# Patient Record
Sex: Female | Born: 1960 | Race: White | Hispanic: No | Smoking: Former smoker
Health system: Southern US, Community
[De-identification: ages and names within clinical notes are randomized; demographics above are authoritative.]

## PROBLEM LIST (undated history)

## (undated) DIAGNOSIS — B009 Herpesviral infection, unspecified: Secondary | ICD-10-CM

## (undated) DIAGNOSIS — F32A Depression, unspecified: Secondary | ICD-10-CM

## (undated) DIAGNOSIS — K449 Diaphragmatic hernia without obstruction or gangrene: Secondary | ICD-10-CM

## (undated) DIAGNOSIS — R1310 Dysphagia, unspecified: Secondary | ICD-10-CM

## (undated) DIAGNOSIS — I10 Essential (primary) hypertension: Secondary | ICD-10-CM

## (undated) DIAGNOSIS — J45909 Unspecified asthma, uncomplicated: Secondary | ICD-10-CM

## (undated) DIAGNOSIS — F419 Anxiety disorder, unspecified: Secondary | ICD-10-CM

## (undated) DIAGNOSIS — F329 Major depressive disorder, single episode, unspecified: Secondary | ICD-10-CM

## (undated) HISTORY — PX: TUBAL LIGATION: SHX77

## (undated) HISTORY — PX: BREAST BIOPSY: SHX20

## (undated) HISTORY — DX: Depression, unspecified: F32.A

## (undated) HISTORY — DX: Anxiety disorder, unspecified: F41.9

## (undated) HISTORY — DX: Diaphragmatic hernia without obstruction or gangrene: K44.9

## (undated) HISTORY — PX: CHOLECYSTECTOMY: SHX55

## (undated) HISTORY — DX: Unspecified asthma, uncomplicated: J45.909

## (undated) HISTORY — DX: Dysphagia, unspecified: R13.10

## (undated) HISTORY — PX: BREAST LUMPECTOMY: SHX2

## (undated) HISTORY — DX: Herpesviral infection, unspecified: B00.9

## (undated) HISTORY — PX: COLONOSCOPY: SHX174

---
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
## 1998-01-19 ENCOUNTER — Other Ambulatory Visit: Admission: RE | Admit: 1998-01-19 | Discharge: 1998-01-19 | Payer: Self-pay | Admitting: Obstetrics and Gynecology

## 1999-01-31 ENCOUNTER — Other Ambulatory Visit: Admission: RE | Admit: 1999-01-31 | Discharge: 1999-01-31 | Payer: Self-pay | Admitting: Obstetrics and Gynecology

## 2000-02-06 ENCOUNTER — Other Ambulatory Visit: Admission: RE | Admit: 2000-02-06 | Discharge: 2000-02-06 | Payer: Self-pay | Admitting: Obstetrics & Gynecology

## 2000-02-06 ENCOUNTER — Other Ambulatory Visit: Admission: RE | Admit: 2000-02-06 | Discharge: 2000-02-06 | Payer: Self-pay | Admitting: Obstetrics and Gynecology

## 2001-02-26 ENCOUNTER — Other Ambulatory Visit: Admission: RE | Admit: 2001-02-26 | Discharge: 2001-02-26 | Payer: Self-pay | Admitting: Obstetrics and Gynecology

## 2002-03-26 ENCOUNTER — Other Ambulatory Visit: Admission: RE | Admit: 2002-03-26 | Discharge: 2002-03-26 | Payer: Self-pay | Admitting: Obstetrics and Gynecology

## 2003-05-27 ENCOUNTER — Other Ambulatory Visit: Admission: RE | Admit: 2003-05-27 | Discharge: 2003-05-27 | Payer: Self-pay | Admitting: Obstetrics and Gynecology

## 2004-06-01 ENCOUNTER — Other Ambulatory Visit: Admission: RE | Admit: 2004-06-01 | Discharge: 2004-06-01 | Payer: Self-pay | Admitting: Obstetrics and Gynecology

## 2004-12-01 ENCOUNTER — Observation Stay (HOSPITAL_COMMUNITY): Admission: RE | Admit: 2004-12-01 | Discharge: 2004-12-02 | Payer: Self-pay | Admitting: Obstetrics and Gynecology

## 2005-06-08 ENCOUNTER — Other Ambulatory Visit: Admission: RE | Admit: 2005-06-08 | Discharge: 2005-06-08 | Payer: Self-pay | Admitting: Obstetrics and Gynecology

## 2005-06-21 ENCOUNTER — Ambulatory Visit (HOSPITAL_COMMUNITY): Admission: RE | Admit: 2005-06-21 | Discharge: 2005-06-21 | Payer: Self-pay | Admitting: Obstetrics and Gynecology

## 2006-06-06 ENCOUNTER — Emergency Department (HOSPITAL_COMMUNITY): Admission: EM | Admit: 2006-06-06 | Discharge: 2006-06-06 | Payer: Self-pay | Admitting: Emergency Medicine

## 2011-05-04 ENCOUNTER — Other Ambulatory Visit: Payer: Self-pay | Admitting: Obstetrics and Gynecology

## 2012-06-11 ENCOUNTER — Other Ambulatory Visit: Payer: Self-pay | Admitting: Obstetrics and Gynecology

## 2012-06-16 ENCOUNTER — Other Ambulatory Visit: Payer: Self-pay | Admitting: Obstetrics and Gynecology

## 2012-06-16 DIAGNOSIS — R928 Other abnormal and inconclusive findings on diagnostic imaging of breast: Secondary | ICD-10-CM

## 2012-06-26 ENCOUNTER — Ambulatory Visit
Admission: RE | Admit: 2012-06-26 | Discharge: 2012-06-26 | Disposition: A | Discharge status: Home / Self Care | Payer: BC Managed Care – PPO | Source: Ambulatory Visit | Attending: Obstetrics and Gynecology | Admitting: Obstetrics and Gynecology

## 2012-06-26 DIAGNOSIS — R928 Other abnormal and inconclusive findings on diagnostic imaging of breast: Secondary | ICD-10-CM

## 2013-06-26 ENCOUNTER — Other Ambulatory Visit: Payer: Self-pay | Admitting: Obstetrics and Gynecology

## 2013-08-18 ENCOUNTER — Ambulatory Visit: Payer: Self-pay

## 2013-08-25 ENCOUNTER — Ambulatory Visit: Payer: Self-pay

## 2014-07-01 ENCOUNTER — Other Ambulatory Visit: Payer: Self-pay | Admitting: Obstetrics and Gynecology

## 2014-07-02 ENCOUNTER — Other Ambulatory Visit: Payer: Self-pay | Admitting: Obstetrics and Gynecology

## 2014-07-02 DIAGNOSIS — R928 Other abnormal and inconclusive findings on diagnostic imaging of breast: Secondary | ICD-10-CM

## 2014-07-05 LAB — CYTOLOGY - PAP

## 2014-07-12 ENCOUNTER — Ambulatory Visit
Admission: RE | Admit: 2014-07-12 | Discharge: 2014-07-12 | Disposition: A | Discharge status: Home / Self Care | Payer: BLUE CROSS/BLUE SHIELD | Source: Ambulatory Visit | Attending: Obstetrics and Gynecology | Admitting: Obstetrics and Gynecology

## 2014-07-12 ENCOUNTER — Other Ambulatory Visit: Payer: Self-pay | Admitting: Obstetrics and Gynecology

## 2014-07-12 DIAGNOSIS — R928 Other abnormal and inconclusive findings on diagnostic imaging of breast: Secondary | ICD-10-CM

## 2014-07-15 ENCOUNTER — Other Ambulatory Visit: Payer: Self-pay | Admitting: Obstetrics and Gynecology

## 2014-07-15 DIAGNOSIS — R928 Other abnormal and inconclusive findings on diagnostic imaging of breast: Secondary | ICD-10-CM

## 2014-07-19 ENCOUNTER — Inpatient Hospital Stay: Admission: RE | Admit: 2014-07-19 | Payer: BLUE CROSS/BLUE SHIELD | Source: Ambulatory Visit

## 2014-07-26 ENCOUNTER — Ambulatory Visit
Admission: RE | Admit: 2014-07-26 | Discharge: 2014-07-26 | Disposition: A | Discharge status: Home / Self Care | Payer: BLUE CROSS/BLUE SHIELD | Source: Ambulatory Visit | Attending: Obstetrics and Gynecology | Admitting: Obstetrics and Gynecology

## 2014-07-26 ENCOUNTER — Other Ambulatory Visit: Payer: BLUE CROSS/BLUE SHIELD

## 2014-07-26 ENCOUNTER — Other Ambulatory Visit: Payer: Self-pay | Admitting: Obstetrics and Gynecology

## 2014-07-26 DIAGNOSIS — R928 Other abnormal and inconclusive findings on diagnostic imaging of breast: Secondary | ICD-10-CM

## 2014-07-26 DIAGNOSIS — N6002 Solitary cyst of left breast: Secondary | ICD-10-CM

## 2015-10-19 ENCOUNTER — Other Ambulatory Visit (HOSPITAL_BASED_OUTPATIENT_CLINIC_OR_DEPARTMENT_OTHER): Payer: Self-pay | Admitting: Podiatry

## 2015-10-19 DIAGNOSIS — M79671 Pain in right foot: Secondary | ICD-10-CM

## 2015-10-29 ENCOUNTER — Encounter (HOSPITAL_BASED_OUTPATIENT_CLINIC_OR_DEPARTMENT_OTHER): Payer: Self-pay

## 2015-10-29 ENCOUNTER — Ambulatory Visit (HOSPITAL_BASED_OUTPATIENT_CLINIC_OR_DEPARTMENT_OTHER): Payer: 59

## 2016-01-06 ENCOUNTER — Other Ambulatory Visit: Payer: Self-pay | Admitting: Obstetrics and Gynecology

## 2016-01-06 ENCOUNTER — Other Ambulatory Visit: Payer: Self-pay | Admitting: Family Medicine

## 2016-01-06 DIAGNOSIS — Z1231 Encounter for screening mammogram for malignant neoplasm of breast: Secondary | ICD-10-CM

## 2016-02-14 ENCOUNTER — Ambulatory Visit
Admission: RE | Admit: 2016-02-14 | Discharge: 2016-02-14 | Disposition: A | Discharge status: Home / Self Care | Payer: 59 | Source: Ambulatory Visit | Attending: Obstetrics and Gynecology | Admitting: Obstetrics and Gynecology

## 2016-02-14 DIAGNOSIS — Z1231 Encounter for screening mammogram for malignant neoplasm of breast: Secondary | ICD-10-CM

## 2016-04-11 DIAGNOSIS — Z79899 Other long term (current) drug therapy: Secondary | ICD-10-CM | POA: Diagnosis not present

## 2016-04-11 DIAGNOSIS — Z1389 Encounter for screening for other disorder: Secondary | ICD-10-CM | POA: Diagnosis not present

## 2016-04-19 DIAGNOSIS — G4700 Insomnia, unspecified: Secondary | ICD-10-CM | POA: Diagnosis not present

## 2016-05-09 DIAGNOSIS — Z79899 Other long term (current) drug therapy: Secondary | ICD-10-CM | POA: Diagnosis not present

## 2016-06-19 DIAGNOSIS — Z79899 Other long term (current) drug therapy: Secondary | ICD-10-CM | POA: Diagnosis not present

## 2016-12-21 DIAGNOSIS — M549 Dorsalgia, unspecified: Secondary | ICD-10-CM | POA: Diagnosis not present

## 2016-12-21 DIAGNOSIS — M62830 Muscle spasm of back: Secondary | ICD-10-CM | POA: Diagnosis not present

## 2016-12-31 DIAGNOSIS — N912 Amenorrhea, unspecified: Secondary | ICD-10-CM | POA: Diagnosis not present

## 2016-12-31 DIAGNOSIS — Z0000 Encounter for general adult medical examination without abnormal findings: Secondary | ICD-10-CM | POA: Diagnosis not present

## 2016-12-31 DIAGNOSIS — E559 Vitamin D deficiency, unspecified: Secondary | ICD-10-CM | POA: Diagnosis not present

## 2017-01-24 DIAGNOSIS — M8588 Other specified disorders of bone density and structure, other site: Secondary | ICD-10-CM | POA: Diagnosis not present

## 2017-01-24 DIAGNOSIS — M85852 Other specified disorders of bone density and structure, left thigh: Secondary | ICD-10-CM | POA: Diagnosis not present

## 2017-02-07 ENCOUNTER — Other Ambulatory Visit: Payer: Self-pay | Admitting: Obstetrics and Gynecology

## 2017-02-07 DIAGNOSIS — Z1231 Encounter for screening mammogram for malignant neoplasm of breast: Secondary | ICD-10-CM

## 2017-06-28 DIAGNOSIS — I10 Essential (primary) hypertension: Secondary | ICD-10-CM | POA: Diagnosis not present

## 2017-07-27 DIAGNOSIS — J018 Other acute sinusitis: Secondary | ICD-10-CM | POA: Diagnosis not present

## 2017-07-27 DIAGNOSIS — I10 Essential (primary) hypertension: Secondary | ICD-10-CM | POA: Diagnosis not present

## 2017-07-27 DIAGNOSIS — E559 Vitamin D deficiency, unspecified: Secondary | ICD-10-CM | POA: Diagnosis not present

## 2017-07-27 DIAGNOSIS — J302 Other seasonal allergic rhinitis: Secondary | ICD-10-CM | POA: Diagnosis not present

## 2017-07-27 DIAGNOSIS — R05 Cough: Secondary | ICD-10-CM | POA: Diagnosis not present

## 2017-08-01 DIAGNOSIS — E559 Vitamin D deficiency, unspecified: Secondary | ICD-10-CM | POA: Diagnosis not present

## 2017-08-01 DIAGNOSIS — J302 Other seasonal allergic rhinitis: Secondary | ICD-10-CM | POA: Diagnosis not present

## 2017-08-01 DIAGNOSIS — I10 Essential (primary) hypertension: Secondary | ICD-10-CM | POA: Diagnosis not present

## 2017-08-11 DIAGNOSIS — J018 Other acute sinusitis: Secondary | ICD-10-CM | POA: Diagnosis not present

## 2017-08-11 DIAGNOSIS — R05 Cough: Secondary | ICD-10-CM | POA: Diagnosis not present

## 2017-10-16 DIAGNOSIS — Z124 Encounter for screening for malignant neoplasm of cervix: Secondary | ICD-10-CM | POA: Diagnosis not present

## 2017-10-16 DIAGNOSIS — Z01419 Encounter for gynecological examination (general) (routine) without abnormal findings: Secondary | ICD-10-CM | POA: Diagnosis not present

## 2017-10-16 DIAGNOSIS — Z808 Family history of malignant neoplasm of other organs or systems: Secondary | ICD-10-CM | POA: Diagnosis not present

## 2017-10-16 DIAGNOSIS — Z1231 Encounter for screening mammogram for malignant neoplasm of breast: Secondary | ICD-10-CM | POA: Diagnosis not present

## 2017-10-16 DIAGNOSIS — Z800 Family history of malignant neoplasm of digestive organs: Secondary | ICD-10-CM | POA: Diagnosis not present

## 2017-10-16 DIAGNOSIS — Z803 Family history of malignant neoplasm of breast: Secondary | ICD-10-CM | POA: Diagnosis not present

## 2017-10-18 ENCOUNTER — Other Ambulatory Visit: Payer: Self-pay | Admitting: Obstetrics and Gynecology

## 2017-10-18 DIAGNOSIS — N6320 Unspecified lump in the left breast, unspecified quadrant: Secondary | ICD-10-CM

## 2017-10-23 ENCOUNTER — Ambulatory Visit
Admission: RE | Admit: 2017-10-23 | Discharge: 2017-10-23 | Disposition: A | Discharge status: Home / Self Care | Payer: 59 | Source: Ambulatory Visit | Attending: Obstetrics and Gynecology | Admitting: Obstetrics and Gynecology

## 2017-10-23 DIAGNOSIS — N644 Mastodynia: Secondary | ICD-10-CM | POA: Diagnosis not present

## 2017-10-23 DIAGNOSIS — R928 Other abnormal and inconclusive findings on diagnostic imaging of breast: Secondary | ICD-10-CM | POA: Diagnosis not present

## 2017-10-23 DIAGNOSIS — N6320 Unspecified lump in the left breast, unspecified quadrant: Secondary | ICD-10-CM

## 2017-12-07 DIAGNOSIS — I10 Essential (primary) hypertension: Secondary | ICD-10-CM | POA: Diagnosis not present

## 2017-12-11 ENCOUNTER — Other Ambulatory Visit: Payer: Self-pay | Admitting: Obstetrics and Gynecology

## 2017-12-11 DIAGNOSIS — Z803 Family history of malignant neoplasm of breast: Secondary | ICD-10-CM

## 2017-12-21 ENCOUNTER — Other Ambulatory Visit: Payer: 59

## 2017-12-22 DIAGNOSIS — K5900 Constipation, unspecified: Secondary | ICD-10-CM | POA: Diagnosis not present

## 2017-12-22 DIAGNOSIS — R945 Abnormal results of liver function studies: Secondary | ICD-10-CM | POA: Diagnosis not present

## 2017-12-22 DIAGNOSIS — Z0000 Encounter for general adult medical examination without abnormal findings: Secondary | ICD-10-CM | POA: Diagnosis not present

## 2017-12-31 DIAGNOSIS — K5900 Constipation, unspecified: Secondary | ICD-10-CM | POA: Diagnosis not present

## 2017-12-31 DIAGNOSIS — R945 Abnormal results of liver function studies: Secondary | ICD-10-CM | POA: Diagnosis not present

## 2018-01-04 DIAGNOSIS — Z0000 Encounter for general adult medical examination without abnormal findings: Secondary | ICD-10-CM | POA: Diagnosis not present

## 2018-01-14 DIAGNOSIS — L820 Inflamed seborrheic keratosis: Secondary | ICD-10-CM | POA: Diagnosis not present

## 2018-01-14 DIAGNOSIS — D1801 Hemangioma of skin and subcutaneous tissue: Secondary | ICD-10-CM | POA: Diagnosis not present

## 2018-01-14 DIAGNOSIS — D225 Melanocytic nevi of trunk: Secondary | ICD-10-CM | POA: Diagnosis not present

## 2018-01-14 DIAGNOSIS — D2239 Melanocytic nevi of other parts of face: Secondary | ICD-10-CM | POA: Diagnosis not present

## 2018-01-25 ENCOUNTER — Ambulatory Visit
Admission: RE | Admit: 2018-01-25 | Discharge: 2018-01-25 | Disposition: A | Discharge status: Home / Self Care | Payer: 59 | Source: Ambulatory Visit | Attending: Obstetrics and Gynecology | Admitting: Obstetrics and Gynecology

## 2018-01-25 DIAGNOSIS — N644 Mastodynia: Secondary | ICD-10-CM | POA: Diagnosis not present

## 2018-01-25 DIAGNOSIS — Z803 Family history of malignant neoplasm of breast: Secondary | ICD-10-CM

## 2018-01-25 MED ORDER — GADOBUTROL 1 MMOL/ML IV SOLN
10.0000 mL | Freq: Once | INTRAVENOUS | Status: AC | PRN
  Administered 2018-01-25: 10 mL via INTRAVENOUS

## 2018-01-27 DIAGNOSIS — M25532 Pain in left wrist: Secondary | ICD-10-CM

## 2018-01-27 HISTORY — DX: Pain in left wrist: M25.532

## 2018-01-31 ENCOUNTER — Other Ambulatory Visit: Payer: Self-pay | Admitting: Obstetrics and Gynecology

## 2018-01-31 DIAGNOSIS — N6320 Unspecified lump in the left breast, unspecified quadrant: Secondary | ICD-10-CM

## 2018-02-14 ENCOUNTER — Ambulatory Visit
Admission: RE | Admit: 2018-02-14 | Discharge: 2018-02-14 | Disposition: A | Discharge status: Home / Self Care | Payer: 59 | Source: Ambulatory Visit | Attending: Obstetrics and Gynecology | Admitting: Obstetrics and Gynecology

## 2018-02-14 DIAGNOSIS — N6321 Unspecified lump in the left breast, upper outer quadrant: Secondary | ICD-10-CM | POA: Diagnosis not present

## 2018-02-14 DIAGNOSIS — N6320 Unspecified lump in the left breast, unspecified quadrant: Secondary | ICD-10-CM

## 2018-02-14 DIAGNOSIS — N6012 Diffuse cystic mastopathy of left breast: Secondary | ICD-10-CM | POA: Diagnosis not present

## 2018-02-14 MED ORDER — GADOBUTROL 1 MMOL/ML IV SOLN
10.0000 mL | Freq: Once | INTRAVENOUS | Status: AC | PRN
  Administered 2018-02-14: 10 mL via INTRAVENOUS

## 2018-02-25 DIAGNOSIS — M1812 Unilateral primary osteoarthritis of first carpometacarpal joint, left hand: Secondary | ICD-10-CM | POA: Diagnosis not present

## 2018-02-27 DIAGNOSIS — R51 Headache: Secondary | ICD-10-CM | POA: Diagnosis not present

## 2018-02-27 DIAGNOSIS — I10 Essential (primary) hypertension: Secondary | ICD-10-CM | POA: Diagnosis not present

## 2018-07-16 ENCOUNTER — Other Ambulatory Visit: Payer: Self-pay | Admitting: Obstetrics and Gynecology

## 2018-07-16 DIAGNOSIS — N6012 Diffuse cystic mastopathy of left breast: Secondary | ICD-10-CM

## 2018-08-27 ENCOUNTER — Other Ambulatory Visit: Payer: Self-pay

## 2018-08-27 ENCOUNTER — Ambulatory Visit
Admission: RE | Admit: 2018-08-27 | Discharge: 2018-08-27 | Disposition: A | Discharge status: Home / Self Care | Payer: BC Managed Care – PPO | Source: Ambulatory Visit | Attending: Obstetrics and Gynecology | Admitting: Obstetrics and Gynecology

## 2018-08-27 DIAGNOSIS — N6012 Diffuse cystic mastopathy of left breast: Secondary | ICD-10-CM

## 2018-08-27 MED ORDER — GADOBUTROL 1 MMOL/ML IV SOLN
10.0000 mL | Freq: Once | INTRAVENOUS | Status: AC | PRN
  Administered 2018-08-27: 10 mL via INTRAVENOUS

## 2018-09-11 ENCOUNTER — Encounter: Payer: Self-pay | Admitting: Cardiology

## 2018-09-11 ENCOUNTER — Other Ambulatory Visit: Payer: Self-pay

## 2018-09-11 ENCOUNTER — Ambulatory Visit (INDEPENDENT_AMBULATORY_CARE_PROVIDER_SITE_OTHER): Payer: BC Managed Care – PPO | Admitting: Cardiology

## 2018-09-11 VITALS — BP 130/80 | HR 81 | Ht 65.0 in | Wt 258.2 lb

## 2018-09-11 DIAGNOSIS — R0683 Snoring: Secondary | ICD-10-CM

## 2018-09-11 DIAGNOSIS — E7800 Pure hypercholesterolemia, unspecified: Secondary | ICD-10-CM

## 2018-09-11 DIAGNOSIS — G4733 Obstructive sleep apnea (adult) (pediatric): Secondary | ICD-10-CM

## 2018-09-11 DIAGNOSIS — M7989 Other specified soft tissue disorders: Secondary | ICD-10-CM

## 2018-09-11 DIAGNOSIS — F419 Anxiety disorder, unspecified: Secondary | ICD-10-CM

## 2018-09-11 DIAGNOSIS — I10 Essential (primary) hypertension: Secondary | ICD-10-CM | POA: Insufficient documentation

## 2018-09-11 HISTORY — DX: Other specified soft tissue disorders: M79.89

## 2018-09-11 HISTORY — DX: Obstructive sleep apnea (adult) (pediatric): G47.33

## 2018-09-11 HISTORY — DX: Pure hypercholesterolemia, unspecified: E78.00

## 2018-09-11 NOTE — Progress Notes (Signed)
Cardiology Consultation:    Date:  09/11/2018   ID:  Yolanda Sanchez, DOB [birth year], MRN [id number]  PCP:  Sarajane Jews, FNP  Cardiologist:  Jenne Campus, MD   Referring MD: Sarajane Jews, FNP   Chief Complaint  Patient presents with   Hypertension  I have problem with my blood pressure  History of Present Illness:    Yolanda Sanchez is a 58 y.o. female who is being seen today for the evaluation of hypertension at the request of Sarajane Jews, [place].  For years she has been struggling with high blood pressure however lately to struggle became more difficult.  She checks her blood pressure sometimes she sees her blood pressure being 1 28-0 60 systolic.  She is very worried about this and concerned about it.  She tells me that she usually check her blood pressure when she got pain in her low back.  She also tells me that she got blood pressure monitor on the wrist and does have she check her blood pressure.  She also described weakness fatigue and tiredness when she tried to walk.  She does have a Fitbit and usually she does between only.  She does not exercise on a regular basis.  She is obese and she understand this is a problem and is actually quite frustrated trying to lose weight.  Another complaint that she has is the fact that she had swelling of lower extremities and pain in the lower extremities apparently she did have some testing done before including DVT study and those were negative.  Her legs today minimally swollen there is only minimal pitting edema her legs to have some lymphedema.  She described the fact that her legs feel heavy especially left one.  She also tell me that she sleeps in the recliner because she is so stressful, more comfortable that way.  She snores and she snores a lot and she said to me that multiple family members does have sleep apnea.  She was started years ago for sleep apnea apparently does not have 1 copies did not have a Pap and then  but she said she was much lighter than she is right now.  She does have hypertension does have dyslipidemia however no treatment for it.  Does have family history of coronary artery disease but not premature.  No past medical history on file.  No past surgical history on file.  Current Medications: Current Meds  Medication Sig   buPROPion (WELLBUTRIN XL) 300 MG 24 hr tablet Take 1 tablet by mouth daily.   lisinopril-hydrochlorothiazide (ZESTORETIC) 10-12.5 MG tablet Take 1 tablet by mouth daily.   meloxicam (MOBIC) 7.5 MG tablet Take 7.5 mg by mouth daily.   montelukast (SINGULAIR) 10 MG tablet Take 1 tablet by mouth daily.   valACYclovir (VALTREX) 500 MG tablet Take 2 tablets by mouth 2 (two) times daily.     Allergies:   Patient has no known allergies.   Social History   Socioeconomic History   Marital status: Divorced    Spouse name: Not on file   Number of children: Not on file   Years of education: Not on file   Highest education level: Not on file  Occupational History   Not on file  Social Needs   Financial resource strain: Not on file   Food insecurity    Worry: Not on file    Inability: Not on file   Transportation needs    Medical: Not on file  Non-medical: Not on file  Tobacco Use   Smoking status: Former Smoker   Smokeless tobacco: Never Used  Substance and Sexual Activity   Alcohol use: Yes   Drug use: Never   Sexual activity: Not on file  Lifestyle   Physical activity    Days per week: Not on file    Minutes per session: Not on file   Stress: Not on file  Relationships   Social connections    Talks on phone: Not on file    Gets together: Not on file    Attends religious service: Not on file    Active member of club or organization: Not on file    Attends meetings of clubs or organizations: Not on file    Relationship status: Not on file  Other Topics Concern   Not on file  Social History Narrative   Not on file      Family History: The patient's family history includes Colon cancer in her paternal grandfather; Dementia in her paternal grandmother; Depression in her mother; Heart attack in her maternal grandfather and maternal grandmother; Heart disease in her maternal grandmother; Hyperlipidemia in her brother, father, and mother; Hypertension in her brother, brother, and mother; Skin cancer in her father; Sleep apnea in her brother. ROS:   Please see the history of present illness.    All 14 point review of systems negative except as described per history of present illness.  EKGs/Labs/Other Studies Reviewed:    The following studies were reviewed today:   EKG:  EKG is  ordered today.  The ekg ordered today demonstrates normal sinus rhythm rhythm possible left atrial enlargement poor R wave progression anterior precordium nonspecific ST segment changes  Recent Labs: No results found for requested labs within last 8760 hours.  Recent Lipid Panel No results found for: CHOL, TRIG, HDL, CHOLHDL, VLDL, LDLCALC, LDLDIRECT  Physical Exam:    VS:  BP 130/80    Pulse 81    Ht 5\' 5"  (1.651 m)    Wt 258 lb 3.2 oz (117.1 kg)    SpO2 98%    BMI 42.97 kg/m     Wt Readings from Last 3 Encounters:  09/11/18 258 lb 3.2 oz (117.1 kg)     GEN:  Well nourished, well developed in no acute distress HEENT: Normal NECK: No JVD; No carotid bruits LYMPHATICS: No lymphadenopathy CARDIAC: RRR, no murmurs, no rubs, no gallops RESPIRATORY:  Clear to auscultation without rales, wheezing or rhonchi  ABDOMEN: Soft, non-tender, non-distended MUSCULOSKELETAL:  No edema; No deformity  SKIN: Warm and dry NEUROLOGIC:  Alert and oriented x 3 PSYCHIATRIC:  Normal affect   ASSESSMENT:    1. Hypertension, unspecified type   2. Snoring   3. Leg swelling   4. Hypercholesterolemia   5. Anxiety   6. Obstructive sleep apnea   7. Swelling of both lower extremities    PLAN:    In order of problems listed  above:  1. Hypertension I think we need to start from scratch as asked her to get new blood pressure monitor the one on the shoulder not on the wrist.  I asked her to check her blood pressure on a regular basis and taken out of it maybe once or twice a day.  Also asked her to bring her blood pressure monitor to our office next time so we can check accuracy of this device. 2. Snoring I strongly suspect she got sleep apnea she will be scheduled to have a  sleep study actually sleep apnea can explain all of her for symptomatology including difficult to control blood pressure. 3. Swelling of lower extremities that is nonpitting edema we were talking about potentially using elastic stockings and keeping her legs up when She had a chance however I will also schedule her to have an echocardiogram so we can check see how strong his heart and also check for evidence of left ventricle hypertrophy. 4. Anxiety seems to be under control doing well.   Medication Adjustments/Labs and Tests Ordered: Current medicines are reviewed at length with the patient today.  Concerns regarding medicines are outlined above.  Orders Placed This Encounter  Procedures   EKG 12-Lead   ECHOCARDIOGRAM COMPLETE   Split night study   No orders of the defined types were placed in this encounter.   Signed, Park Liter, MD, [facility]. 09/11/2018 12:33 PM    [place]

## 2018-09-11 NOTE — Patient Instructions (Signed)
Medication Instructions:  Your physician recommends that you continue on your current medications as directed. Please refer to the Current Medication list given to you today.  If you need a refill on your cardiac medications before your next appointment, please call your pharmacy.   Lab work: None.  If you have labs (blood work) drawn today and your tests are completely normal, you will receive your results only by: Marland Kitchen MyChart Message (if you have MyChart) OR . A paper copy in the mail If you have any lab test that is abnormal or we need to change your treatment, we will call you to review the results.  Testing/Procedures: Your physician has requested that you have an echocardiogram. Echocardiography is a painless test that uses sound waves to create images of your heart. It provides your doctor with information about the size and shape of your heart and how well your heart's chambers and valves are working. This procedure takes approximately one hour. There are no restrictions for this procedure.  Your physician has recommended that you have a sleep study. This test records several body functions during sleep, including: brain activity, eye movement, oxygen and carbon dioxide blood levels, heart rate and rhythm, breathing rate and rhythm, the flow of air through your mouth and nose, snoring, body muscle movements, and chest and belly movement.    Follow-Up: At [facility], you and your health needs are our priority.  As part of our continuing mission to provide you with exceptional heart care, we have created designated Provider Care Teams.  These Care Teams include your primary Cardiologist (physician) and Advanced Practice Providers (APPs -  Physician Assistants and Nurse Practitioners) who all work together to provide you with the care you need, when you need it. You will need a follow up appointment in 6 weeks.  Please call our office 2 months in advance to schedule this appointment.   You may see No primary care provider on file. or another member of our [employer] Provider Team in [place]: Shirlee More, MD . Jyl Heinz, MD  Any Other Special Instructions Will Be Listed Below (If Applicable).   Echocardiogram An echocardiogram is a procedure that uses painless sound waves (ultrasound) to produce an image of the heart. Images from an echocardiogram can provide important information about:  Signs of coronary artery disease (CAD).  Aneurysm detection. An aneurysm is a weak or damaged part of an artery wall that bulges out from the normal force of blood pumping through the body.  Heart size and shape. Changes in the size or shape of the heart can be associated with certain conditions, including heart failure, aneurysm, and CAD.  Heart muscle function.  Heart valve function.  Signs of a past heart attack.  Fluid buildup around the heart.  Thickening of the heart muscle.  A tumor or infectious growth around the heart valves. Tell a health care provider about:  Any allergies you have.  All medicines you are taking, including vitamins, herbs, eye drops, creams, and over-the-counter medicines.  Any blood disorders you have.  Any surgeries you have had.  Any medical conditions you have.  Whether you are pregnant or may be pregnant. What are the risks? Generally, this is a safe procedure. However, problems may occur, including:  Allergic reaction to dye (contrast) that may be used during the procedure. What happens before the procedure? No specific preparation is needed. You may eat and drink normally. What happens during the procedure?   An IV tube  may be inserted into one of your veins.  You may receive contrast through this tube. A contrast is an injection that improves the quality of the pictures from your heart.  A gel will be applied to your chest.  A wand-like tool (transducer) will be moved over your chest. The gel will help to transmit  the sound waves from the transducer.  The sound waves will harmlessly bounce off of your heart to allow the heart images to be captured in real-time motion. The images will be recorded on a computer. The procedure may vary among health care providers and hospitals. What happens after the procedure?  You may return to your normal, everyday life, including diet, activities, and medicines, unless your health care provider tells you not to do that. Summary  An echocardiogram is a procedure that uses painless sound waves (ultrasound) to produce an image of the heart.  Images from an echocardiogram can provide important information about the size and shape of your heart, heart muscle function, heart valve function, and fluid buildup around your heart.  You do not need to do anything to prepare before this procedure. You may eat and drink normally.  After the echocardiogram is completed, you may return to your normal, everyday life, unless your health care provider tells you not to do that. This information is not intended to replace advice given to you by your health care provider. Make sure you discuss any questions you have with your health care provider. Document Released: 02/24/2000 Document Revised: 06/19/2018 Document Reviewed: 03/31/2016 Elsevier Patient Education  [place].   Sleep Studies A sleep study (polysomnogram) is a series of tests done while you are sleeping. A sleep study records your brain waves, heart rate, breathing rate, oxygen level, and eye and leg movements. A sleep study helps your health care provider:  See how well you sleep.  Diagnose a sleep disorder.  Determine how severe your sleep disorder is.  Create a plan to treat your sleep disorder. Your health care provider may recommend a sleep study if you:  Feel sleepy on most days.  Snore loudly while sleeping.  Have unusual behaviors while you sleep, such as walking.  Have brief periods in which  you stop breathing during sleep (sleepapnea).  Fall asleep suddenly during the day (narcolepsy).  Have trouble falling asleep or staying asleep (insomnia).  Feel like you need to move your legs when trying to fall asleep (restless legs syndrome).  Move your legs by flexing and extending them regularly while asleep (periodic limb movement disorder).  Act out your dreams while you sleep (sleep behavior disorder).  Feel like you cannot move when you first wake up (sleep paralysis). What tests are part of a sleep study? Most sleep studies record the following during sleep:  Brain activity.  Eye movements.  Heart rate and rhythm.  Breathing rate and rhythm.  Blood-oxygen level.  Blood pressure.  Chest and belly movement as you breathe.  Arm and leg movements.  Snoring or other noises.  Body position. Where are sleep studies done? Sleep studies are done at sleep centers. A sleep center may be inside a hospital, office, or clinic. The room where you have the study may look like a hospital room or a hotel room. The health care providers doing the study may come in and out of the room during the study. Most of the time, they will be in another room monitoring your test as you sleep. How are sleep studies  done? Most sleep studies are done during a normal period of time for a full night of sleep. You will arrive at the study center in the evening and go home in the morning. Before the test  Bring your pajamas and toothbrush with you to the sleep study.  Do not have caffeine on the day of your sleep study.  Do not drink alcohol on the day of your sleep study.  Your health care provider will let you know if you should stop taking any of your regular medicines before the test. During the test      Round, sticky patches with sensors attached to recording wires (electrodes) are placed on your scalp, face, chest, and limbs.  Wires from all the electrodes and sensors run from  your bed to a computer. The wires can be taken off and put back on if you need to get out of bed to go to the bathroom.  A sensor is placed over your nose to measure airflow.  A finger clip is put on your finger or ear to measure your blood oxygen level (pulse oximetry).  A belt is placed around your belly and a belt is placed around your chest to measure breathing movements.  If you have signs of the sleep disorder called sleep apnea during your test, you may get a treatment mask to wear for the second half of the night. ? The mask provides positive airway pressure (PAP) to help you breathe better during sleep. This may greatly improve your sleep apnea. ? You will then have all tests done again with the mask in place to see if your measurements and recordings change. After the test  A medical doctor who specializes in sleep will evaluate the results of your sleep study and share them with you and your primary health care provider.  Based on your results, your medical history, and a physical exam, you may be diagnosed with a sleep disorder, such as: ? Sleep apnea. ? Restless legs syndrome. ? Sleep-related behavior disorder. ? Sleep-related movement disorders. ? Sleep-related seizure disorders.  Your health care team will help determine your treatment options based on your diagnosis. This may include: ? Improving your sleep habits (sleep hygiene). ? Wearing a continuous positive airway pressure (CPAP) or bi-level positive airway pressure (BPAP) mask. ? Wearing an oral device at night to improve breathing and reduce snoring. ? Taking medicines. Follow these instructions at home:  Take over-the-counter and prescription medicines only as told by your health care provider.  If you are instructed to use a CPAP or BPAP mask, make sure you use it nightly as directed.  Make any lifestyle changes that your health care provider recommends.  If you were given a device to open your airway while  you sleep, use it only as told by your health care provider.  Do not use any tobacco products, such as cigarettes, chewing tobacco, and e-cigarettes. If you need help quitting, ask your health care provider.  Keep all follow-up visits as told by your health care provider. This is important. Summary  A sleep study (polysomnogram) is a series of tests done while you are sleeping. It shows how well you sleep.  Most sleep studies are done over one full night of sleep. You will arrive at the study center in the evening and go home in the morning.  If you have signs of the sleep disorder called sleep apnea during your test, you may get a treatment mask to wear for  the second half of the night.  A medical doctor who specializes in sleep will evaluate the results of your sleep study and share them with your primary health care provider. This information is not intended to replace advice given to you by your health care provider. Make sure you discuss any questions you have with your health care provider. Document Released: 09/02/2002 Document Revised: 12/13/2017 Document Reviewed: 03/26/2017 Elsevier Patient Education  2020 [employer].

## 2018-09-19 ENCOUNTER — Ambulatory Visit (INDEPENDENT_AMBULATORY_CARE_PROVIDER_SITE_OTHER): Payer: BC Managed Care – PPO

## 2018-09-19 ENCOUNTER — Other Ambulatory Visit: Payer: Self-pay

## 2018-09-19 DIAGNOSIS — I10 Essential (primary) hypertension: Secondary | ICD-10-CM | POA: Diagnosis not present

## 2018-09-19 DIAGNOSIS — M7989 Other specified soft tissue disorders: Secondary | ICD-10-CM

## 2018-09-19 NOTE — Progress Notes (Signed)
Complete echocardiogram has been performed.  Jimmy Olney Monier RDCS, RVT 

## 2018-09-22 ENCOUNTER — Telehealth: Payer: Self-pay

## 2018-09-22 ENCOUNTER — Telehealth: Payer: Self-pay | Admitting: *Deleted

## 2018-09-22 DIAGNOSIS — R0683 Snoring: Secondary | ICD-10-CM

## 2018-09-22 NOTE — Telephone Encounter (Signed)
-----   Message from Ashok Norris, RN sent at 09/11/2018  2:28 PM EDT ----- Regarding: Please precert Please precert sleep study for snoring per Dr. Agustin Cree.

## 2018-09-22 NOTE — Telephone Encounter (Signed)
Phoned and informed patient of normal echocardiogram. States she has a follow-up appt with Dr. Agustin Cree and wonders if she needs to keep the appointment. Patient was informed to keep the follow-up appt. At [facility] office. She verbalizes understanding. No additional questions.

## 2018-09-22 NOTE — Telephone Encounter (Signed)
Staff message sent to [place] denied in lab study. Approved HST ok to schedule. Order ID [id number]. Valid 09/22/18 to 03/21/2019.

## 2018-09-25 NOTE — Telephone Encounter (Signed)
Patient informed of home sleep study appointment time and date.

## 2018-09-25 NOTE — Addendum Note (Signed)
Addended by: Ashok Norris on: 09/25/2018 01:46 PM   Modules accepted: Orders

## 2018-10-03 ENCOUNTER — Ambulatory Visit: Payer: BC Managed Care – PPO | Admitting: Cardiology

## 2018-11-27 ENCOUNTER — Telehealth: Payer: Self-pay | Admitting: Cardiology

## 2018-11-27 NOTE — Telephone Encounter (Signed)
Called patient. Informed her that her insurance denied a in lab study but approved a home sleep test, I also let her know of her follow up appointment with Dr. Agustin Cree. Patient verbally understands. No further questions.

## 2018-11-27 NOTE — Telephone Encounter (Signed)
Patient wnts to make sure that we have PA for the sleep study. Also she wants to make sure that results of sleep study will be redy for her appt or she wants to reschedule due to her large Copay amount.

## 2018-12-01 ENCOUNTER — Encounter (HOSPITAL_BASED_OUTPATIENT_CLINIC_OR_DEPARTMENT_OTHER): Payer: BC Managed Care – PPO | Admitting: Cardiovascular Disease

## 2018-12-02 ENCOUNTER — Ambulatory Visit (HOSPITAL_BASED_OUTPATIENT_CLINIC_OR_DEPARTMENT_OTHER): Payer: BC Managed Care – PPO | Attending: Cardiology | Admitting: Cardiovascular Disease

## 2018-12-02 ENCOUNTER — Other Ambulatory Visit: Payer: Self-pay

## 2018-12-02 DIAGNOSIS — G4733 Obstructive sleep apnea (adult) (pediatric): Secondary | ICD-10-CM | POA: Diagnosis not present

## 2018-12-02 DIAGNOSIS — R0683 Snoring: Secondary | ICD-10-CM | POA: Diagnosis not present

## 2018-12-07 ENCOUNTER — Encounter (HOSPITAL_BASED_OUTPATIENT_CLINIC_OR_DEPARTMENT_OTHER): Payer: Self-pay | Admitting: Cardiovascular Disease

## 2018-12-07 NOTE — Procedures (Signed)
      Patient Name: Yolanda Sanchez, Yolanda Sanchez Date: 12/02/2018 Gender: Female D.O.B: 1961/03/11 Age (years): 58 Referring Provider: Park Liter Height (inches): 48 Interpreting Physician: Shelva Majestic MD, ABSM Weight (lbs): 258 RPSGT: Jacolyn Reedy BMI: 44 MRN: [id number] Neck Size: 15.50  CLINICAL INFORMATION Sleep Study Type: HST  Indication for sleep study: Snoring  Epworth Sleepiness Score: 4  SLEEP STUDY TECHNIQUE A multi-channel overnight portable sleep study was performed. The channels recorded were: nasal airflow, thoracic respiratory movement, and oxygen saturation with a pulse oximetry. Snoring was also monitored.  MEDICATIONS     buPROPion (WELLBUTRIN XL) 300 MG 24 hr tablet             lisinopril-hydrochlorothiazide (ZESTORETIC) 10-12.5 MG tablet         meloxicam (MOBIC) 7.5 MG tablet         montelukast (SINGULAIR) 10 MG tablet         valACYclovir (VALTREX) 500 MG tablet      Patient self administered medications include: N/A.  SLEEP ARCHITECTURE Patient was studied for 450 minutes. The sleep efficiency was 100.0 % and the patient was supine for 97%. The arousal index was 0.0 per hour.  RESPIRATORY PARAMETERS The overall AHI was 25.2 per hour, with a central apnea index of 0.0 per hour.   The oxygen nadir was 79% during sleep.  CARDIAC DATA Mean heart rate during sleep was 69.2 bpm.  IMPRESSIONS - Moderate obstructive sleep apnea (AHI 25.2/h). Since this was a home study the severity of sleep apnea during REM sleep cannot be determined. - No significant central sleep apnea occurred during this study (CAI = 0.0/h).. - Severe oxygen desaturation to a nadir of O2 79%. Time spent < 89% was 15.2 minutes - Patient snored 48.0% during the sleep.  DIAGNOSIS - Obstructive Sleep Apnea (327.23 [G47.33 ICD-10]) - Nocturnal Hypoxemia (327.26 [G47.36 ICD-10])  RECOMMENDATIONS - In this patient with cardiovascular comorbidities and oxygen  desaturation recommend a CPAP titration study to optimally assess her sleep disordered breathing. If unable to do an in-lab titration initiate AutoPap with EPR of 3 at 6 - 20 cm water. - Effort should be made to optimize nasal and oropharyngeal patency. - Avoid alcohol, sedatives and other CNS depressants that may worsen sleep apnea and disrupt normal sleep architecture. - Sleep hygiene should be reviewed to assess factors that may improve sleep quality. - Weight management (BMI 44) and regular exercise should be initiated or continued. - Recommend a download after 30 days of CPAP initiation and sleep clinic evaluation.  [Electronically signed] 12/07/2018 11:35 AM  Shelva Majestic MD, [facility], ABSM Diplomate, American Board of Sleep Medicine   NPI: [id number] [place] PH: [phone]   FX: [phone] [place]

## 2018-12-08 ENCOUNTER — Encounter: Payer: Self-pay | Admitting: Cardiology

## 2018-12-08 ENCOUNTER — Ambulatory Visit (INDEPENDENT_AMBULATORY_CARE_PROVIDER_SITE_OTHER): Payer: BC Managed Care – PPO | Admitting: Cardiology

## 2018-12-08 ENCOUNTER — Other Ambulatory Visit: Payer: Self-pay

## 2018-12-08 VITALS — BP 132/78 | HR 71 | Ht 65.0 in | Wt 243.0 lb

## 2018-12-08 DIAGNOSIS — G4733 Obstructive sleep apnea (adult) (pediatric): Secondary | ICD-10-CM | POA: Diagnosis not present

## 2018-12-08 DIAGNOSIS — E7800 Pure hypercholesterolemia, unspecified: Secondary | ICD-10-CM | POA: Diagnosis not present

## 2018-12-08 DIAGNOSIS — I10 Essential (primary) hypertension: Secondary | ICD-10-CM

## 2018-12-08 NOTE — Progress Notes (Signed)
Cardiology Office Note:    Date:  12/08/2018   ID:  Yolanda Sanchez, Heppler 1960/03/28, MRN [id number]  PCP:  Sarajane Jews, FNP  Cardiologist:  Jenne Campus, MD    Referring MD: Sarajane Jews, FNP   Chief Complaint  Patient presents with  . Follow up on Sleep Study  Doing well  History of Present Illness:    Yolanda Sanchez is a 58 y.o. female who came to me for management of hypertension.  Also during the conversation I realized that she most likely got significant sleep apnea.  She comes today to my office for follow-up after test being done.  Her echocardiogram showed preserved left ventricle ejection fraction but there is left ventricle hypertrophy.  Her sleep study showed at least moderate sleep apnea.  It was done at home therefore we are unable to determine degree of sleep apnea during the REM phase.  CPAP mask has been recommended.  She comes today to my office to talk about this problem she is doing well denies have any chest pain tightness squeezing pressure burning chest she decided to change her diet and change her lifestyle and she is beginning to lose weight.  We had a long discussion about what to do with the situation.  I advised to have a CPAP study done for titration however she is determined to lose significant amount of weight and it is a good opportunity to encourage her to do that.  Also she was told that she had cholesterol on medication but she prefers to reduce cholesterol with diet alone I think she deserves to give her chance of probably 3 to 6 months trial of diet and exercises.  If she can progress that she can lost significant amount of weight we will improve her sleep apnea as well as cholesterol and blood pressure.  No past medical history on file.  No past surgical history on file.  Current Medications: Current Meds  Medication Sig  . buPROPion (WELLBUTRIN XL) 300 MG 24 hr tablet Take 1 tablet by mouth daily.  Marland Kitchen  lisinopril-hydrochlorothiazide (ZESTORETIC) 10-12.5 MG tablet Take 1 tablet by mouth daily.  . meloxicam (MOBIC) 7.5 MG tablet Take 7.5 mg by mouth daily.  . montelukast (SINGULAIR) 10 MG tablet Take 1 tablet by mouth daily.  . valACYclovir (VALTREX) 500 MG tablet Take 2 tablets by mouth 2 (two) times daily.     Allergies:   Patient has no known allergies.   Social History   Socioeconomic History  . Marital status: Divorced    Spouse name: Not on file  . Number of children: Not on file  . Years of education: Not on file  . Highest education level: Not on file  Occupational History  . Not on file  Social Needs  . Financial resource strain: Not on file  . Food insecurity    Worry: Not on file    Inability: Not on file  . Transportation needs    Medical: Not on file    Non-medical: Not on file  Tobacco Use  . Smoking status: Former Research scientist (life sciences)  . Smokeless tobacco: Never Used  Substance and Sexual Activity  . Alcohol use: Yes  . Drug use: Never  . Sexual activity: Not on file  Lifestyle  . Physical activity    Days per week: Not on file    Minutes per session: Not on file  . Stress: Not on file  Relationships  . Social Herbalist on phone:  Not on file    Gets together: Not on file    Attends religious service: Not on file    Active member of club or organization: Not on file    Attends meetings of clubs or organizations: Not on file    Relationship status: Not on file  Other Topics Concern  . Not on file  Social History Narrative  . Not on file     Family History: The patient's family history includes Colon cancer in her paternal grandfather; Dementia in her paternal grandmother; Depression in her mother; Heart attack in her maternal grandfather and maternal grandmother; Heart disease in her maternal grandmother; Hyperlipidemia in her brother, father, and mother; Hypertension in her brother, brother, and mother; Skin cancer in her father; Sleep apnea in her  brother. ROS:   Please see the history of present illness.    All 14 point review of systems negative except as described per history of present illness  EKGs/Labs/Other Studies Reviewed:      Recent Labs: No results found for requested labs within last 8760 hours.  Recent Lipid Panel No results found for: CHOL, TRIG, HDL, CHOLHDL, VLDL, LDLCALC, LDLDIRECT  Physical Exam:    VS:  BP 132/78   Pulse 71   Ht 5\' 5"  (1.651 m)   Wt 243 lb (110.2 kg)   SpO2 98%   BMI 40.44 kg/m     Wt Readings from Last 3 Encounters:  12/08/18 243 lb (110.2 kg)  12/02/18 240 lb (108.9 kg)  09/11/18 258 lb 3.2 oz (117.1 kg)     GEN:  Well nourished, well developed in no acute distress HEENT: Normal NECK: No JVD; No carotid bruits LYMPHATICS: No lymphadenopathy CARDIAC: RRR, no murmurs, no rubs, no gallops RESPIRATORY:  Clear to auscultation without rales, wheezing or rhonchi  ABDOMEN: Soft, non-tender, non-distended MUSCULOSKELETAL:  No edema; No deformity  SKIN: Warm and dry LOWER EXTREMITIES: no swelling NEUROLOGIC:  Alert and oriented x 3 PSYCHIATRIC:  Normal affect   ASSESSMENT:    1. Hypertension, unspecified type   2. Obstructive sleep apnea   3. Hypercholesterolemia    PLAN:    In order of problems listed above:  1. Essential hypertension.  Blood pressure good today in the office.  Asking to check blood pressure on the regular basis and bring results to me. 2. Dyslipidemia.  She preferred to start with diet and exercise on the regular basis I encouraged her to do that we gave her 3 months trial and then determine about need to therapy 3. Essential hypertension.  Blood pressure well controlled in the office.  Asked her to check blood pressure on the regular basis at home.  She does have evidence of LVH on echo.   Medication Adjustments/Labs and Tests Ordered: Current medicines are reviewed at length with the patient today.  Concerns regarding medicines are outlined above.  No  orders of the defined types were placed in this encounter.  Medication changes: No orders of the defined types were placed in this encounter.   Signed, Park Liter, MD, [facility] 12/08/2018 4:47 PM    [place]

## 2018-12-08 NOTE — Patient Instructions (Signed)
Medication Instructions:  Your physician recommends that you continue on your current medications as directed. Please refer to the Current Medication list given to you today.  If you need a refill on your cardiac medications before your next appointment, please call your pharmacy.   Lab work: None.  If you have labs (blood work) drawn today and your tests are completely normal, you will receive your results only by: . MyChart Message (if you have MyChart) OR . A paper copy in the mail If you have any lab test that is abnormal or we need to change your treatment, we will call you to review the results.  Testing/Procedures: None.   Follow-Up: At [facility], you and your health needs are our priority.  As part of our continuing mission to provide you with exceptional heart care, we have created designated Provider Care Teams.  These Care Teams include your primary Cardiologist (physician) and Advanced Practice Providers (APPs -  Physician Assistants and Nurse Practitioners) who all work together to provide you with the care you need, when you need it. You will need a follow up appointment in 3 months.  Please call our office 2 months in advance to schedule this appointment.  You may see No primary care provider on file. or another member of our [facility] Provider Team in [place]: Brian Munley, MD . Rajan Revankar, MD  Any Other Special Instructions Will Be Listed Below (If Applicable).     

## 2018-12-09 ENCOUNTER — Telehealth: Payer: Self-pay | Admitting: *Deleted

## 2018-12-09 ENCOUNTER — Other Ambulatory Visit: Payer: Self-pay | Admitting: Cardiovascular Disease

## 2018-12-09 DIAGNOSIS — G4733 Obstructive sleep apnea (adult) (pediatric): Secondary | ICD-10-CM

## 2018-12-09 NOTE — Telephone Encounter (Signed)
APAP Order placed into Epic for [facility]. [place] faxed to them [phone]

## 2018-12-16 ENCOUNTER — Telehealth: Payer: Self-pay | Admitting: Emergency Medicine

## 2018-12-16 NOTE — Telephone Encounter (Signed)
Per Dr. Agustin Cree if patient wants to hold off on cpap machine at this time and try to lose weight instead then that is fine. Barry Brunner aware.

## 2018-12-16 NOTE — Progress Notes (Signed)
Results were given to patient by ordering MD.

## 2019-03-04 ENCOUNTER — Ambulatory Visit: Payer: BC Managed Care – PPO | Admitting: Cardiology

## 2019-07-04 ENCOUNTER — Emergency Department (HOSPITAL_COMMUNITY): Payer: 59

## 2019-07-04 ENCOUNTER — Emergency Department (HOSPITAL_COMMUNITY)
Admission: EM | Admit: 2019-07-04 | Discharge: 2019-07-04 | Disposition: A | Discharge status: Home / Self Care | Payer: 59 | Attending: Emergency Medicine | Admitting: Emergency Medicine

## 2019-07-04 ENCOUNTER — Other Ambulatory Visit: Payer: Self-pay

## 2019-07-04 DIAGNOSIS — R0789 Other chest pain: Secondary | ICD-10-CM | POA: Diagnosis not present

## 2019-07-04 DIAGNOSIS — Z87891 Personal history of nicotine dependence: Secondary | ICD-10-CM | POA: Insufficient documentation

## 2019-07-04 DIAGNOSIS — I10 Essential (primary) hypertension: Secondary | ICD-10-CM | POA: Diagnosis not present

## 2019-07-04 DIAGNOSIS — R209 Unspecified disturbances of skin sensation: Secondary | ICD-10-CM | POA: Insufficient documentation

## 2019-07-04 DIAGNOSIS — R42 Dizziness and giddiness: Secondary | ICD-10-CM | POA: Diagnosis not present

## 2019-07-04 DIAGNOSIS — Z79899 Other long term (current) drug therapy: Secondary | ICD-10-CM | POA: Insufficient documentation

## 2019-07-04 LAB — I-STAT BETA HCG BLOOD, ED (NOT ORDERABLE): I-stat hCG, quantitative: <5 m[IU]/mL (ref <5)

## 2019-07-04 LAB — BASIC METABOLIC PANEL
Anion gap: 10 (ref 5–15)
BUN: 21 mg/dL — ABNORMAL HIGH (ref 6–20)
CO2: 25 mmol/L (ref 22–32)
Calcium: 9.3 mg/dL (ref 8.9–10.3)
Chloride: 103 mmol/L (ref 98–111)
Creatinine, Ser: 0.95 mg/dL (ref 0.44–1.00)
GFR calc Af Amer: >60 mL/min (ref >60)
GFR calc non Af Amer: >60 mL/min (ref >60)
Glucose, Bld: 97 mg/dL (ref 70–99)
Potassium: 3.6 mmol/L (ref 3.5–5.1)
Sodium: 138 mmol/L (ref 135–145)

## 2019-07-04 LAB — CBC
HCT: 44.6 % (ref 36.0–46.0)
Hemoglobin: 14.9 g/dL (ref 12.0–15.0)
MCH: 32.7 pg (ref 26.0–34.0)
MCHC: 33.4 g/dL (ref 30.0–36.0)
MCV: 98 fL (ref 80.0–100.0)
Platelets: 220 10*3/uL (ref 150–400)
RBC: 4.55 MIL/uL (ref 3.87–5.11)
RDW: 13.4 % (ref 11.5–15.5)
WBC: 8.4 10*3/uL (ref 4.0–10.5)
nRBC: 0 % (ref 0.0–0.2)

## 2019-07-04 LAB — TROPONIN I (HIGH SENSITIVITY): Troponin I (High Sensitivity): <2 ng/L (ref <18)

## 2019-07-04 NOTE — ED Provider Notes (Signed)
[facility] DEPT Provider Note   CSN: [id number] Arrival date & time: 07/04/19  1711     History No chief complaint on file.   Yolanda Sanchez is a 59 y.o. female.  59 year old female with past medical history of hyperlipidemia, hypertension, anxiety, OSA, presents with complaint of not feeling well onset 5PM yesterday while at a birthday part. Patient states she was eating slower than usual, did not feel well, felt dizzy with walking and feels like her left leg is a stump/shoe is too tight/is causing difficulty walking. Patient also reports mid sternal chest tightness, feeling clammy at times, heart feels like it is "flopping around," at times. Patient went home and slept on the sofa, woke up this morning with ongoing symptoms and went to another birthday party. Patient then went to urgent care because she continued to feel unwell, was diagnosed with fluid in her ears, given rx for prednisone and sent to the ER for abnormal EKG.         No past medical history on file.  Patient Active Problem List   Diagnosis Date Noted  . Hypercholesterolemia 09/11/2018  . Anxiety 09/11/2018  . Hypertension 09/11/2018  . Obstructive sleep apnea 09/11/2018  . Swelling of both lower extremities 09/11/2018    No past surgical history on file.   OB History   No obstetric history on file.     Family History  Problem Relation Age of Onset  . Hyperlipidemia Mother   . Hypertension Mother   . Depression Mother   . Hyperlipidemia Father   . Skin cancer Father   . Hypertension Brother   . Heart attack Maternal Grandmother   . Heart disease Maternal Grandmother   . Heart attack Maternal Grandfather   . Dementia Paternal Grandmother   . Colon cancer Paternal Grandfather   . Hypertension Brother   . Hyperlipidemia Brother   . Sleep apnea Brother     Social History   Tobacco Use  . Smoking status: Former Research scientist (life sciences)  . Smokeless tobacco: Never Used   Substance Use Topics  . Alcohol use: Yes  . Drug use: Never    Home Medications Prior to Admission medications   Medication Sig Start Date End Date Taking? Authorizing Provider  albuterol (VENTOLIN HFA) 108 (90 Base) MCG/ACT inhaler Inhale 2 puffs into the lungs every 6 (six) hours as needed for wheezing or shortness of breath.   Yes [provider]  atorvastatin (LIPITOR) 10 MG tablet Take 10 mg by mouth daily.   Yes [provider]  buPROPion (WELLBUTRIN XL) 300 MG 24 hr tablet Take 1 tablet by mouth daily. 07/21/18  Yes [provider]  fluticasone (FLONASE) 50 MCG/ACT nasal spray Place 1 spray into both nostrils daily as needed for allergies or rhinitis.   Yes [provider]  lisinopril-hydrochlorothiazide (ZESTORETIC) 10-12.5 MG tablet Take 1 tablet by mouth daily. 08/13/18  Yes [provider]  meloxicam (MOBIC) 7.5 MG tablet Take 7.5 mg by mouth daily.   Yes [provider]  montelukast (SINGULAIR) 10 MG tablet Take 1 tablet by mouth daily. 08/13/18  Yes [provider]  valACYclovir (VALTREX) 500 MG tablet Take 2 tablets by mouth 2 (two) times daily. 08/13/18  Yes [provider]    Allergies    Patient has no known allergies.  Review of Systems   Review of Systems  Constitutional: Positive for diaphoresis.  Eyes: Negative for visual disturbance.  Respiratory: Positive for chest tightness.  Negative for shortness of breath.   Cardiovascular: Positive for palpitations.  Gastrointestinal: Positive for nausea. Negative for abdominal pain, constipation, diarrhea and vomiting.  Genitourinary: Negative for difficulty urinating.  Musculoskeletal: Negative for arthralgias and myalgias.  Skin: Negative for rash and wound.  Neurological: Positive for dizziness and weakness. Negative for speech difficulty.  Psychiatric/Behavioral: Negative for confusion.  All other systems reviewed and are negative.   Physical  Exam Updated Vital Signs BP 128/72   Pulse 71   Temp 98.2 F (36.8 C) (Oral)   Resp 19   Ht 5\' 5"  (1.651 m)   Wt 102.1 kg   SpO2 97%   BMI 37.44 kg/m   Physical Exam Vitals and nursing note reviewed.  Constitutional:      General: She is not in acute distress.    Appearance: She is well-developed. She is not diaphoretic.  HENT:     Head: Normocephalic and atraumatic.     Right Ear: Ear canal normal. A middle ear effusion is present.     Left Ear: Tympanic membrane and ear canal normal.     Mouth/Throat:     Mouth: Mucous membranes are moist.  Eyes:     Extraocular Movements: Extraocular movements intact.     Pupils: Pupils are equal, round, and reactive to light.  Cardiovascular:     Rate and Rhythm: Normal rate and regular rhythm.     Pulses: Normal pulses.     Heart sounds: Normal heart sounds.  Pulmonary:     Effort: Pulmonary effort is normal.     Breath sounds: Normal breath sounds.  Abdominal:     Palpations: Abdomen is soft.     Tenderness: There is no abdominal tenderness.  Musculoskeletal:     Right lower leg: No edema.     Left lower leg: No edema.  Skin:    General: Skin is warm and dry.     Findings: No erythema or rash.  Neurological:     Mental Status: She is alert and oriented to person, place, and time.     Cranial Nerves: Cranial nerves are intact. No cranial nerve deficit or facial asymmetry.     Motor: Weakness present.     Coordination: Romberg sign negative.     Deep Tendon Reflexes: Babinski sign absent on the right side. Babinski sign absent on the left side.     Reflex Scores:      Patellar reflexes are 1+ on the right side and 1+ on the left side.      Achilles reflexes are 1+ on the right side and 1+ on the left side.    Comments: Slight left leg weakness compared to right, sensation intact  Psychiatric:        Behavior: Behavior normal.     ED Results / Procedures / Treatments   Labs (all labs ordered are listed, but only abnormal  results are displayed) Labs Reviewed  BASIC METABOLIC PANEL - Abnormal; Notable for the following components:      Result Value   BUN 21 (*)    All other components within normal limits  CBC  I-STAT BETA HCG BLOOD, ED (NOT ORDERABLE)  TROPONIN I (HIGH SENSITIVITY)    EKG EKG Interpretation  Date/Time:  Saturday July 04 2019 17:39:41 EDT Ventricular Rate:  77 PR Interval:    QRS Duration: 99 QT Interval:  358 QTC Calculation: 406 R Axis:   47 Text Interpretation: Sinus rhythm Left atrial enlargement 12 Lead; Mason-Likar isolated flipped t  wave in lead III not seen on prior Otherwise no significant change Confirmed by Deno Etienne [phone]) on 07/04/2019 7:05:10 PM   Radiology DG Chest 2 View  Result Date: 07/04/2019 CLINICAL DATA:  Chest pain. EXAM: CHEST - 2 VIEW COMPARISON:  None. FINDINGS: The heart size and mediastinal contours are within normal limits. Both lungs are clear. The visualized skeletal structures are unremarkable. IMPRESSION: No active cardiopulmonary disease. Electronically Signed   By: Dorise Bullion III M.D   On: 07/04/2019 18:29   CT Head Wo Contrast  Result Date: 07/04/2019 CLINICAL DATA:  59 year old female with dizziness and focal neurologic deficit. Concern for stroke. EXAM: CT HEAD WITHOUT CONTRAST TECHNIQUE: Contiguous axial images were obtained from the base of the skull through the vertex without intravenous contrast. COMPARISON:  None. FINDINGS: Brain: The ventricles and sulci appropriate size for patient's age. The gray-white matter discrimination is preserved. There is no acute intracranial hemorrhage. No mass effect or midline shift. No extra-axial fluid collection Vascular: No hyperdense vessel or unexpected calcification. Skull: Normal. Negative for fracture or focal lesion. Sinuses/Orbits: No acute finding. Other: None IMPRESSION: Unremarkable noncontrast CT of the brain. Electronically Signed   By: Anner Crete M.D.   On: 07/04/2019 19:56     Procedures Procedures (including critical care time)  Medications Ordered in ED Medications - No data to display  ED Course  I have reviewed the triage vital signs and the nursing notes.  Pertinent labs & imaging results that were available during my care of the patient were reviewed by me and considered in my medical decision making (see chart for details).  Clinical Course as of Jul 04 2051  Sat Jul 04, 2019  2048 59yo female with various complaint onset yesterday at 5pm as detailed above. On exam, has right TM effusion as diagnosed at UC today and rx prednisone. Subjective weakness of left ankle compared to right, reports symmetric sensation on exam, reflexes symmetric, negative Romberg.  From a cardiac standpoint, EKG without acute ischemic changes, reviewed UC and current EKG with Dr. Tyrone Nine, ER attending. Troponin <2 at greater than 24 hours since onset with constant symptoms. CT head for complaint of dizziness with altered sensation of left leg- unremarkable. Discussed with Dr. Cheral Marker, neurohospitalist, recommends outpatient work up.  Labs WNL including BMP and CBC.  Discussed results with patient, verbalizes understanding and will follow up with PCP, neurology, agrees to return to ER for new or worsening symptoms.    [LM]    Clinical Course User Index [LM] Roque Lias   MDM Rules/Calculators/A&P                      Final Clinical Impression(s) / ED Diagnoses Final diagnoses:  Dizziness  Chest tightness  Abnormal sensation of leg    Rx / DC Orders ED Discharge Orders    None       Roque Lias 07/04/19 2053    Deno Etienne, DO 07/04/19 2217

## 2019-07-04 NOTE — Discharge Instructions (Addendum)
Take the prednisone as prescribed by urgent care. Return to ER for new or worsening symptoms otherwise follow-up with your primary care provider and neurology.

## 2019-07-04 NOTE — ED Triage Notes (Signed)
Patient states she has been having tight feeling in her chest and her head. pcp sent patient to ED for blood work.

## 2019-07-22 IMAGING — MR MR BREAST BIOPSY
6 of 8 series · 34 of 48 positions shown · IV contrast (10 ml gadavist)
Comparison: 01/25/2018 and earlier

Addendum:
CLINICAL DATA: The patient presents for MR guided core biopsy of
non mass enhancement in the UPPER-OUTER QUADRANT of the LEFT breast.

EXAM:
MRI GUIDED CORE NEEDLE BIOPSY OF THE LEFT BREAST
TECHNIQUE: Multiplanar, multisequence MR imaging of the LEFT breast was
performed both before and after administration of intravenous
contrast.
CONTRAST:  10 ml of Gadavist

[Series 3: fiducial unilateral · sagittal · 2.0mm · 1.33mm/px · 1 of 60 slices shown]
[im 1/60]
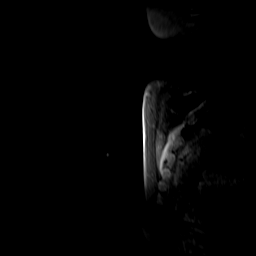

[Series 4: dynamic pre · axial · non-contrast · 1.3mm · 0.73mm/px · z∈[-20,+187]mm · 6 of 160 slices shown]
[im 1/160]
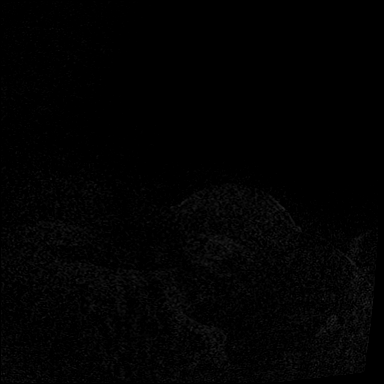
[im 32/160]
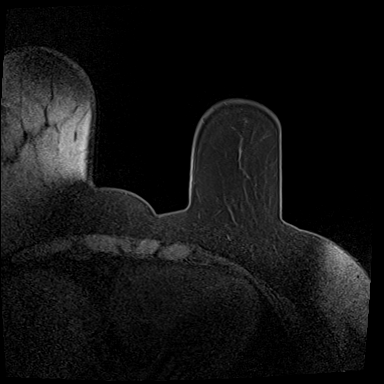
[im 64/160]
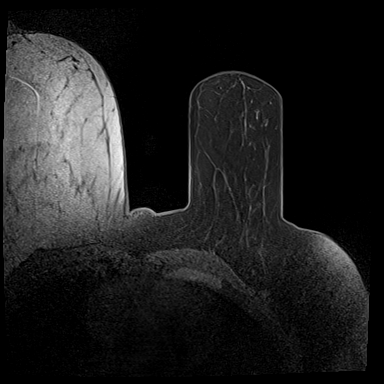
[im 96/160]
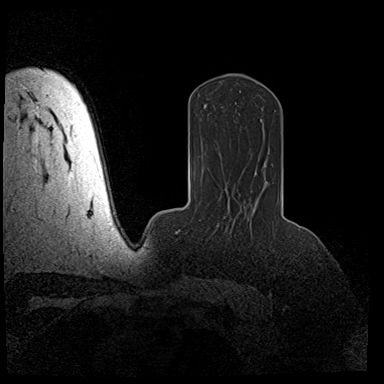
[im 128/160]
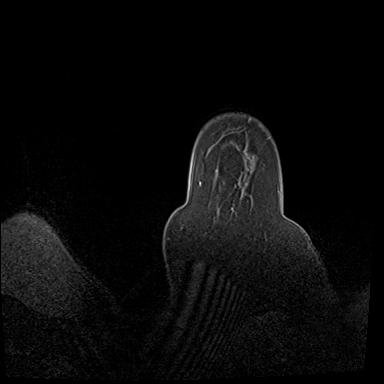
[im 160/160]
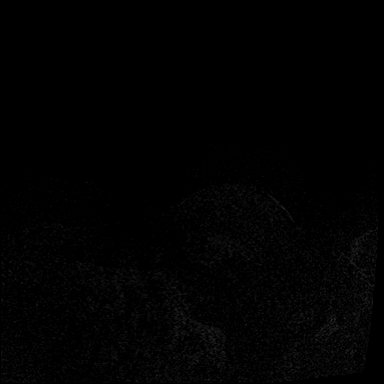

[Series 5: dynamic post 20 · axial · 1.3mm · 0.73mm/px · z∈[-20,+187]mm · 6 of 160 slices shown (1 of 2)]
[im 1/160]
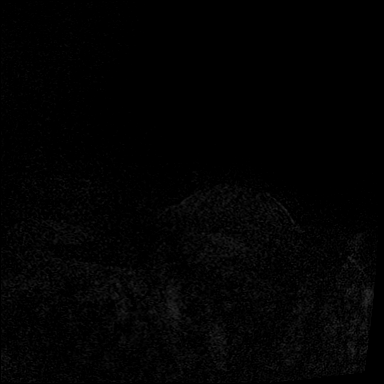
[im 32/160]
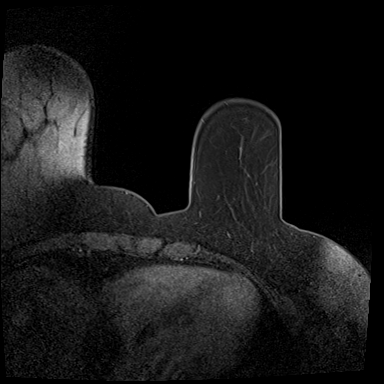
[im 64/160]
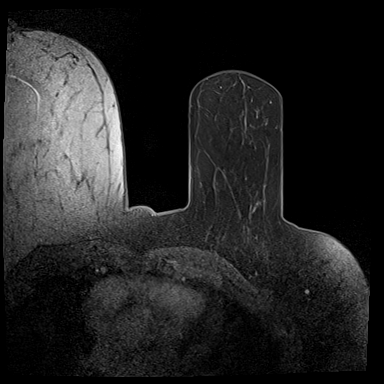
[im 96/160]
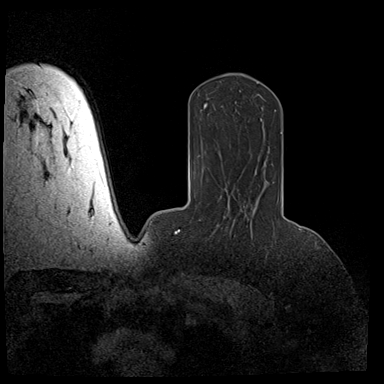
[im 128/160]
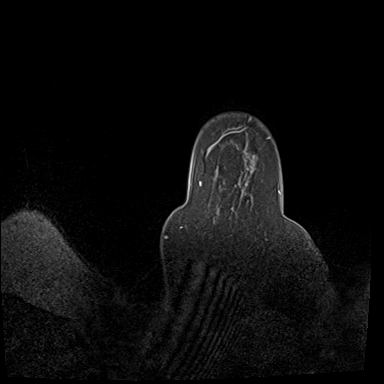
[im 160/160]
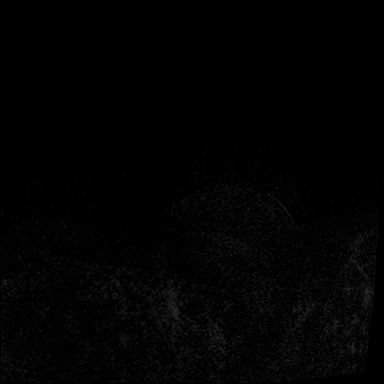

[Series 6: dynamic post 20 · axial · 1.3mm · 0.73mm/px · z∈[-20,+187]mm · 7 of 160 slices shown (2 of 2)]
[im 1/160]
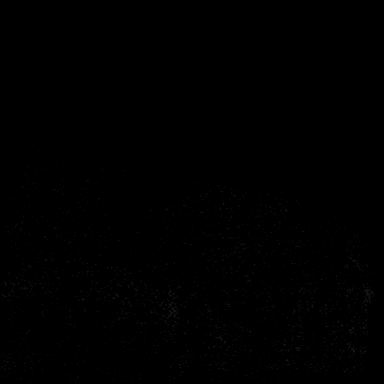
[im 27/160]
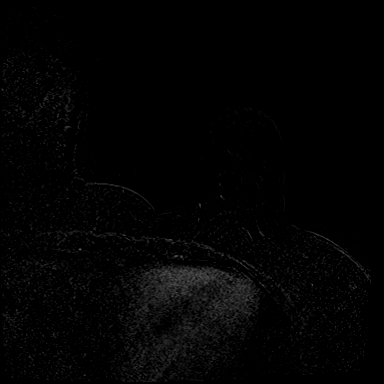
[im 54/160]
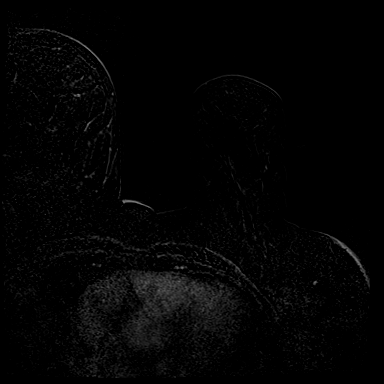
[im 80/160]
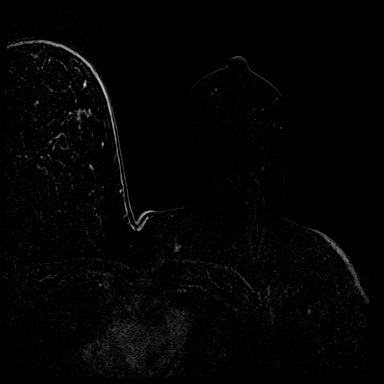
[im 107/160]
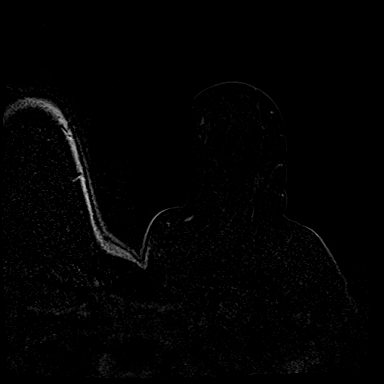
[im 133/160]
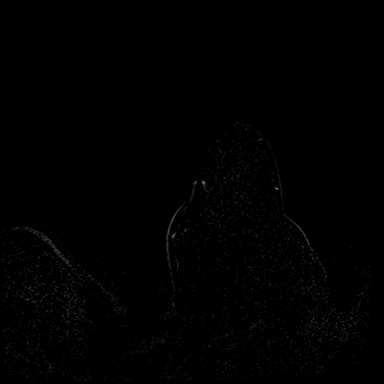
[im 160/160]
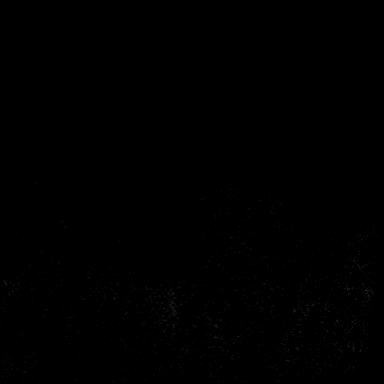

[Series 7: dynamic post 3 · axial · 1.3mm · 0.73mm/px · z∈[-20,+187]mm · 7 of 160 slices shown (1 of 2)]
[im 1/160]
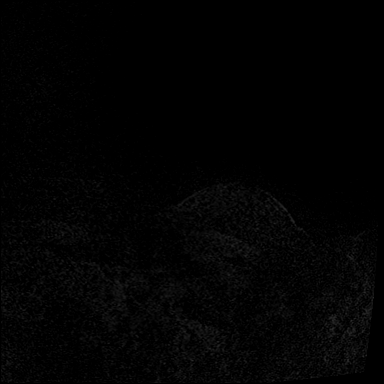
[im 27/160]
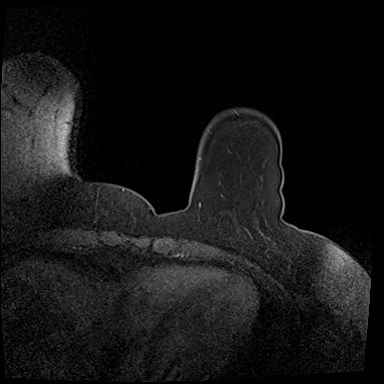
[im 54/160]
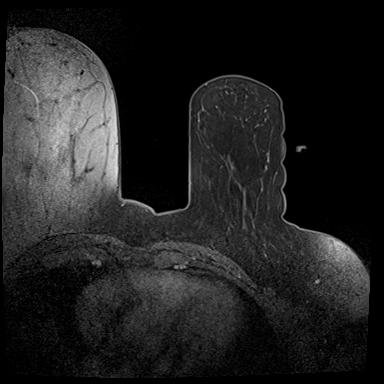
[im 80/160]
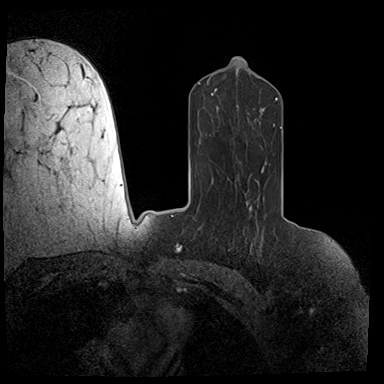
[im 107/160]
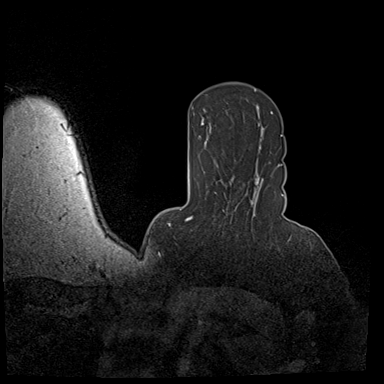
[im 133/160]
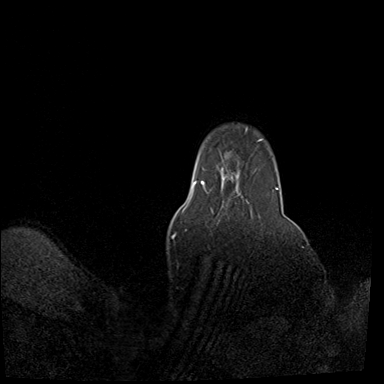
[im 160/160]
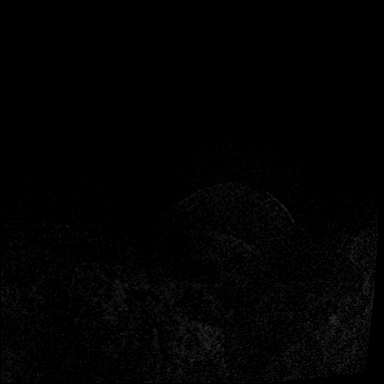

[Series 8: dynamic post 3 · axial · 1.3mm · 0.73mm/px · z∈[-20,+187]mm · 7 of 160 slices shown (2 of 2)]
[im 1/160]
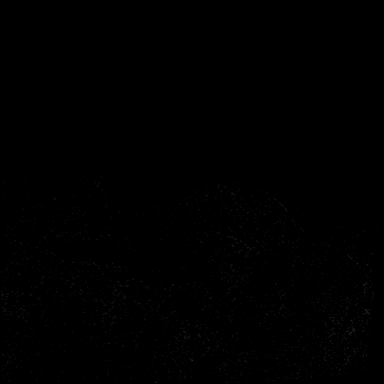
[im 27/160]
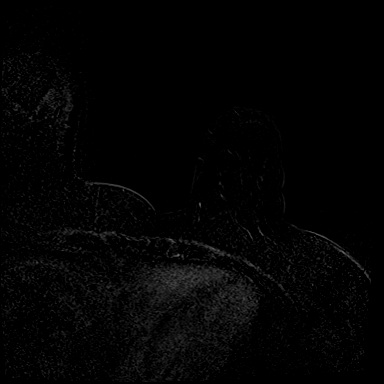
[im 54/160]
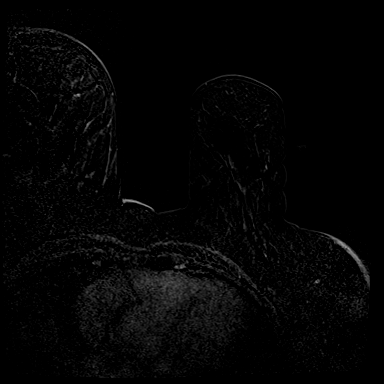
[im 80/160]
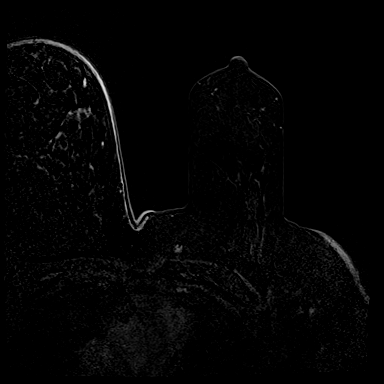
[im 107/160]
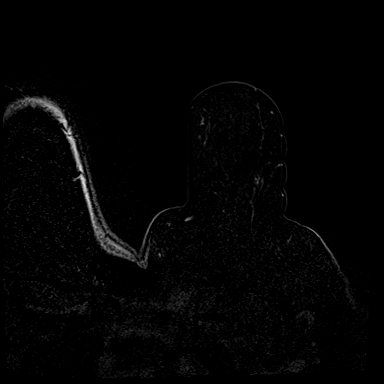
[im 133/160]
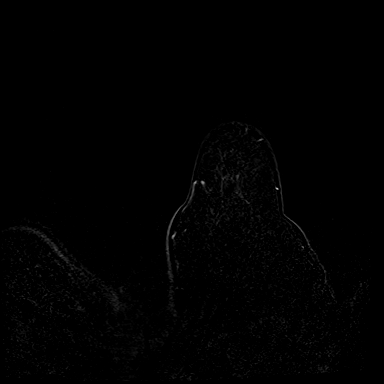
[im 160/160]
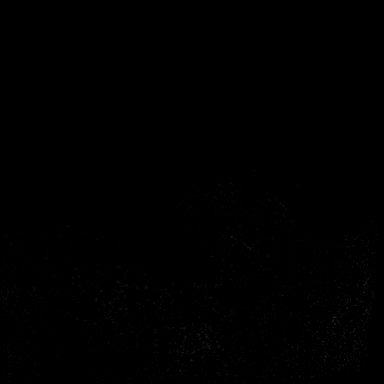

[34 of 48 positions shown; findings below may reference images not displayed]

FINDINGS: I met with the patient, and we discussed the procedure of MRI guided
biopsy, including risks, benefits, and alternatives. Specifically,
we discussed the risks of infection, bleeding, tissue injury, clip
migration, and inadequate sampling. Informed, written consent was
given. The usual time out protocol was performed immediately prior
to the procedure.

Using sterile technique, 1% Lidocaine, MRI guidance, and a 9 gauge
vacuum assisted device, biopsy was performed of clumped non mass
enhancement in the UPPER-OUTER QUADRANT of the LEFT breast using a
LATERAL to MEDIAL approach. At the conclusion of the procedure, a
dumbbell-shaped tissue marker clip was deployed into the biopsy
cavity. Follow-up 2-view mammogram was performed and dictated
separately.
IMPRESSION: MRI guided biopsy of LEFT breast enhancement. No apparent
complications.

ADDENDUM:
Pathology revealed FIBROCYSTIC CHANGES WITH CALCIFICATIONS of the
Left breast, upper outer quadrant, (dumbbell clip). This was found
to be concordant by Dr. Rizmajer Verhetetlen.

Pathology results were discussed with the patient by telephone. The
patient reported doing well after the biopsy with tenderness and
bruising at the site. Post biopsy instructions and care were
reviewed and questions were answered. The patient was encouraged to
call The [REDACTED] for any additional
concerns.

The patient was instructed to return for a bilateral breast MRI in 6
months and informed a reminder notice would be sent regarding this
appointment.

Pathology results reported by Arrar Ppoj, RN on 02/17/2018.

*** End of Addendum ***

## 2019-08-20 LAB — HM COLONOSCOPY

## 2019-09-15 ENCOUNTER — Ambulatory Visit: Payer: 59 | Admitting: Cardiology

## 2019-09-15 ENCOUNTER — Other Ambulatory Visit: Payer: Self-pay

## 2019-09-15 ENCOUNTER — Encounter: Payer: Self-pay | Admitting: Cardiology

## 2019-09-15 VITALS — BP 138/84 | HR 82 | Ht 65.0 in | Wt 225.6 lb

## 2019-09-15 DIAGNOSIS — R0789 Other chest pain: Secondary | ICD-10-CM | POA: Insufficient documentation

## 2019-09-15 DIAGNOSIS — R079 Chest pain, unspecified: Secondary | ICD-10-CM | POA: Diagnosis not present

## 2019-09-15 DIAGNOSIS — E7800 Pure hypercholesterolemia, unspecified: Secondary | ICD-10-CM | POA: Diagnosis not present

## 2019-09-15 DIAGNOSIS — G4733 Obstructive sleep apnea (adult) (pediatric): Secondary | ICD-10-CM

## 2019-09-15 DIAGNOSIS — R002 Palpitations: Secondary | ICD-10-CM

## 2019-09-15 DIAGNOSIS — I10 Essential (primary) hypertension: Secondary | ICD-10-CM

## 2019-09-15 HISTORY — DX: Palpitations: R00.2

## 2019-09-15 HISTORY — DX: Other chest pain: R07.89

## 2019-09-15 NOTE — Progress Notes (Signed)
Cardiology Office Note:    Date:  09/15/2019   ID:  Yolanda Sanchez, DOB [birth year], MRN [id number]  PCP:  Marge Duncans, PA-C  Cardiologist:  Jenne Campus, MD    Referring MD: Marge Duncans, PA-C   No chief complaint on file. None doing fine now  History of Present Illness:    Yolanda Sanchez is a 59 y.o. female past medical history significant for hypertension, morbid obesity, and obstructive sleep apnea comes today to my office for follow-up.  Recently she ended up going to the emergency room because of few complaints.  First of all she felt weird strange she was kind of dizzy also described to have some palpitations.  Palpitations still ongoing.  She said sometimes at the meantime she could be sitting and she can feel her heart speeding up.  She will get her fit bit and she will see heart rate of 100.  That make her very worried.  She also described to have episode of chest tightness when she ended going to the emergency room, biochemical markers were abnormal but again she is very concerned about this.  She lost significant amount of weight but because of her being sick having some ear problem she gained her weight back partially.  No past medical history on file.  No past surgical history on file.  Current Medications: Current Meds  Medication Sig  . albuterol (VENTOLIN HFA) 108 (90 Base) MCG/ACT inhaler Inhale 2 puffs into the lungs every 6 (six) hours as needed for wheezing or shortness of breath.  Marland Kitchen atorvastatin (LIPITOR) 10 MG tablet Take 10 mg by mouth daily.  Marland Kitchen buPROPion (WELLBUTRIN XL) 300 MG 24 hr tablet Take 1 tablet by mouth daily.  . fluticasone (FLONASE) 50 MCG/ACT nasal spray Place 1 spray into both nostrils daily as needed for allergies or rhinitis.  Marland Kitchen lisinopril-hydrochlorothiazide (ZESTORETIC) 10-12.5 MG tablet Take 1 tablet by mouth daily.  . meloxicam (MOBIC) 7.5 MG tablet Take 7.5 mg by mouth daily.  . montelukast (SINGULAIR) 10 MG tablet Take 1  tablet by mouth daily.  . valACYclovir (VALTREX) 500 MG tablet Take 2 tablets by mouth 2 (two) times daily.     Allergies:   Patient has no known allergies.   Social History   Socioeconomic History  . Marital status: Divorced    Spouse name: Not on file  . Number of children: Not on file  . Years of education: Not on file  . Highest education level: Not on file  Occupational History  . Not on file  Tobacco Use  . Smoking status: Former Research scientist (life sciences)  . Smokeless tobacco: Never Used  Vaping Use  . Vaping Use: Never used  Substance and Sexual Activity  . Alcohol use: Yes  . Drug use: Never  . Sexual activity: Not on file  Other Topics Concern  . Not on file  Social History Narrative  . Not on file   Social Determinants of Health   Financial Resource Strain:   . Difficulty of Paying Living Expenses:   Food Insecurity:   . Worried About Charity fundraiser in the Last Year:   . Arboriculturist in the Last Year:   Transportation Needs:   . Film/video editor (Medical):   Marland Kitchen Lack of Transportation (Non-Medical):   Physical Activity:   . Days of Exercise per Week:   . Minutes of Exercise per Session:   Stress:   . Feeling of Stress :   Social Connections:   .  Frequency of Communication with Friends and Family:   . Frequency of Social Gatherings with Friends and Family:   . Attends Religious Services:   . Active Member of Clubs or Organizations:   . Attends Archivist Meetings:   Marland Kitchen Marital Status:      Family History: The patient's family history includes Colon cancer in her paternal grandfather; Dementia in her paternal grandmother; Depression in her mother; Heart attack in her maternal grandfather and maternal grandmother; Heart disease in her maternal grandmother; Hyperlipidemia in her brother, father, and mother; Hypertension in her brother, brother, and mother; Skin cancer in her father; Sleep apnea in her brother. ROS:   Please see the history of present  illness.    All 14 point review of systems negative except as described per history of present illness  EKGs/Labs/Other Studies Reviewed:      Recent Labs: 07/04/2019: BUN 21; Creatinine, Ser 0.95; Hemoglobin 14.9; Platelets 220; Potassium 3.6; Sodium 138  Recent Lipid Panel No results found for: CHOL, TRIG, HDL, CHOLHDL, VLDL, LDLCALC, LDLDIRECT  Physical Exam:    VS:  BP 138/84 (BP Location: Left Arm, Patient Position: Sitting, Cuff Size: Normal)   Pulse 82   Ht 5\' 5"  (1.651 m)   Wt 225 lb 9.6 oz (102.3 kg)   SpO2 95%   BMI 37.54 kg/m     Wt Readings from Last 3 Encounters:  09/15/19 225 lb 9.6 oz (102.3 kg)  07/04/19 225 lb (102.1 kg)  12/08/18 243 lb (110.2 kg)     GEN:  Well nourished, well developed in no acute distress HEENT: Normal NECK: No JVD; No carotid bruits LYMPHATICS: No lymphadenopathy CARDIAC: RRR, no murmurs, no rubs, no gallops RESPIRATORY:  Clear to auscultation without rales, wheezing or rhonchi  ABDOMEN: Soft, non-tender, non-distended MUSCULOSKELETAL:  No edema; No deformity  SKIN: Warm and dry LOWER EXTREMITIES: no swelling NEUROLOGIC:  Alert and oriented x 3 PSYCHIATRIC:  Normal affect   ASSESSMENT:    1. Obstructive sleep apnea   2. Palpitations   3. Chest pain of uncertain etiology   4. Hypercholesterolemia   5. Essential hypertension   6. Atypical chest pain    PLAN:    In order of problems listed above:  1. Obstructive sleep apnea: She accepted my offer to have titration study.  Her home monitoring showed significant sleep apnea however we waited until she lose some weight before doing titration study.  And she is ready to do it which I will pursue. 2. Palpitations: She will wear monitor for about 2 weeks.  To see what kind of arrhythmia if any she is having. 3. Chest pain stress test will be done this is to rule out any potential ischemia. 4. Dyslipidemia: I will call her primary care physician to get her fasting lipid  profile. 5. Essential hypertension blood pressure well controlled continue present management.    Medication Adjustments/Labs and Tests Ordered: Current medicines are reviewed at length with the patient today.  Concerns regarding medicines are outlined above.  Orders Placed This Encounter  Procedures  . Lipid panel  . LONG TERM MONITOR (3-14 DAYS)  . MYOCARDIAL PERFUSION IMAGING  . Split night study   Medication changes: No orders of the defined types were placed in this encounter.   Signed, Park Liter, MD, [facility] 09/15/2019 4:47 PM    [place]

## 2019-09-15 NOTE — Patient Instructions (Addendum)
Medication Instructions:  Your physician recommends that you continue on your current medications as directed. Please refer to the Current Medication list given to you today.  *If you need a refill on your cardiac medications before your next appointment, please call your pharmacy*   Lab Work: Your physician recommends that you return for lab work today: lipid  If you have labs (blood work) drawn today and your tests are completely normal, you will receive your results only by: Marland Kitchen MyChart Message (if you have MyChart) OR . A paper copy in the mail If you have any lab test that is abnormal or we need to change your treatment, we will call you to review the results.   Testing/Procedures:   [facility] [address] [place], [place] [address] Phone:  [phone]    Please arrive 15 minutes prior to your appointment time for registration and insurance purposes.  The test will take approximately 3 to 4 hours to complete; you may bring reading material.  If someone comes with you to your appointment, they will need to remain in the main lobby due to limited space in the testing area. **If you are pregnant or breastfeeding, please notify the nuclear lab prior to your appointment**  How to prepare for your Myocardial Perfusion Test: . Do not eat or drink 3 hours prior to your test, except you may have water. . Do not consume products containing caffeine (regular or decaffeinated) 12 hours prior to your test. (ex: coffee, chocolate, sodas, tea). . Do bring a list of your current medications with you.  If not listed below, you may take your medications as normal. . Do wear comfortable clothes (no dresses or overalls) and walking shoes, tennis shoes preferred (No heels or open toe shoes are allowed). . Do NOT wear cologne, perfume, aftershave, or lotions (deodorant is allowed). . If these instructions are not followed, your test will have to be  rescheduled.  Please report to [address] for your test.  If you have questions or concerns about your appointment, you can call the [facility] at [phone].  If you cannot keep your appointment, please provide 24 hours notification to the Nuclear Lab, to avoid a possible $50 charge to your account.  A zio monitor was ordered today. It will remain on for 14 days. You will then return monitor and event diary in provided box. It takes 1-2 weeks for report to be downloaded and returned to [place]. We will call you with the results. If monitor falls off or has orange flashing light, please call Zio for further instructions.   Your physician has recommended that you have a sleep study. This test records several body functions during sleep, including: brain activity, eye movement, oxygen and carbon dioxide blood levels, heart rate and rhythm, breathing rate and rhythm, the flow of air through your mouth and nose, snoring, body muscle movements, and chest and belly movement.    Follow-Up: At [facility], you and your health needs are our priority.  As part of our continuing mission to provide you with exceptional heart care, we have created designated Provider Care Teams.  These Care Teams include your primary Cardiologist (physician) and Advanced Practice Providers (APPs -  Physician Assistants and Nurse Practitioners) who all work together to provide you with the care you need, when you need it.  We recommend signing up for the patient portal called "MyChart".  Sign up information is provided  on this After Visit Summary.  MyChart is used to connect with patients for Virtual Visits (Telemedicine).  Patients are able to view lab/test results, encounter notes, upcoming appointments, etc.  Non-urgent messages can be sent to your provider as well.   To learn more about what you can do with MyChart, go to [email or web address].    Your next appointment:   3  month(s)  The format for your next appointment:   In Person  Provider:   Jenne Campus, MD   Other Instructions  Cardiac Nuclear Scan A cardiac nuclear scan is a test that measures blood flow to the heart when a person is resting and when he or she is exercising. The test looks for problems such as:  Not enough blood reaching a portion of the heart.  The heart muscle not working normally. You may need this test if:  You have heart disease.  You have had abnormal lab results.  You have had heart surgery or a balloon procedure to open up blocked arteries (angioplasty).  You have chest pain.  You have shortness of breath. In this test, a radioactive dye (tracer) is injected into your bloodstream. After the tracer has traveled to your heart, an imaging device is used to measure how much of the tracer is absorbed by or distributed to various areas of your heart. This procedure is usually done at a hospital and takes 2-4 hours. Tell a health care provider about:  Any allergies you have.  All medicines you are taking, including vitamins, herbs, eye drops, creams, and over-the-counter medicines.  Any problems you or family members have had with anesthetic medicines.  Any blood disorders you have.  Any surgeries you have had.  Any medical conditions you have.  Whether you are pregnant or may be pregnant. What are the risks? Generally, this is a safe procedure. However, problems may occur, including:  Serious chest pain and heart attack. This is only a risk if the stress portion of the test is done.  Rapid heartbeat.  Sensation of warmth in your chest. This usually passes quickly.  Allergic reaction to the tracer. What happens before the procedure?  Ask your health care provider about changing or stopping your regular medicines. This is especially important if you are taking diabetes medicines or blood thinners.  Follow instructions from your health care provider  about eating or drinking restrictions.  Remove your jewelry on the day of the procedure. What happens during the procedure?  An IV will be inserted into one of your veins.  Your health care provider will inject a small amount of radioactive tracer through the IV.  You will wait for 20-40 minutes while the tracer travels through your bloodstream.  Your heart activity will be monitored with an electrocardiogram (ECG).  You will lie down on an exam table.  Images of your heart will be taken for about 15-20 minutes.  You may also have a stress test. For this test, one of the following may be done: ? You will exercise on a treadmill or stationary bike. While you exercise, your heart's activity will be monitored with an ECG, and your blood pressure will be checked. ? You will be given medicines that will increase blood flow to parts of your heart. This is done if you are unable to exercise.  When blood flow to your heart has peaked, a tracer will again be injected through the IV.  After 20-40 minutes, you will get back on the exam table and  have more images taken of your heart.  Depending on the type of tracer used, scans may need to be repeated 3-4 hours later.  Your IV line will be removed when the procedure is over. The procedure may vary among health care providers and hospitals. What happens after the procedure?  Unless your health care provider tells you otherwise, you may return to your normal schedule, including diet, activities, and medicines.  Unless your health care provider tells you otherwise, you may increase your fluid intake. This will help to flush the contrast dye from your body. Drink enough fluid to keep your urine pale yellow.  Ask your health care provider, or the department that is doing the test: ? When will my results be ready? ? How will I get my results? Summary  A cardiac nuclear scan measures the blood flow to the heart when a person is resting and when  he or she is exercising.  Tell your health care provider if you are pregnant.  Before the procedure, ask your health care provider about changing or stopping your regular medicines. This is especially important if you are taking diabetes medicines or blood thinners.  After the procedure, unless your health care provider tells you otherwise, increase your fluid intake. This will help flush the contrast dye from your body.  After the procedure, unless your health care provider tells you otherwise, you may return to your normal schedule, including diet, activities, and medicines. This information is not intended to replace advice given to you by your health care provider. Make sure you discuss any questions you have with your health care provider. Document Revised: 08/12/2017 Document Reviewed: 08/12/2017 Elsevier Patient Education  [place].   Sleep Studies A sleep study (polysomnogram) is a series of tests done while you are sleeping. A sleep study records your brain waves, heart rate, breathing rate, oxygen level, and eye and leg movements. A sleep study helps your health care provider:  See how well you sleep.  Diagnose a sleep disorder.  Determine how severe your sleep disorder is.  Create a plan to treat your sleep disorder. Your health care provider may recommend a sleep study if you:  Feel sleepy on most days.  Snore loudly while sleeping.  Have unusual behaviors while you sleep, such as walking.  Have brief periods in which you stop breathing during sleep (sleepapnea).  Fall asleep suddenly during the day (narcolepsy).  Have trouble falling asleep or staying asleep (insomnia).  Feel like you need to move your legs when trying to fall asleep (restless legs syndrome).  Move your legs by flexing and extending them regularly while asleep (periodic limb movement disorder).  Act out your dreams while you sleep (sleep behavior disorder).  Feel like you cannot  move when you first wake up (sleep paralysis). What tests are part of a sleep study? Most sleep studies record the following during sleep:  Brain activity.  Eye movements.  Heart rate and rhythm.  Breathing rate and rhythm.  Blood-oxygen level.  Blood pressure.  Chest and belly movement as you breathe.  Arm and leg movements.  Snoring or other noises.  Body position. Where are sleep studies done? Sleep studies are done at sleep centers. A sleep center may be inside a hospital, office, or clinic. The room where you have the study may look like a hospital room or a hotel room. The health care providers doing the study may come in and out of the room during the study.  Most of the time, they will be in another room monitoring your test as you sleep. How are sleep studies done? Most sleep studies are done during a normal period of time for a full night of sleep. You will arrive at the study center in the evening and go home in the morning. Before the test  Bring your pajamas and toothbrush with you to the sleep study.  Do not have caffeine on the day of your sleep study.  Do not drink alcohol on the day of your sleep study.  Your health care provider will let you know if you should stop taking any of your regular medicines before the test. During the test      Round, sticky patches with sensors attached to recording wires (electrodes) are placed on your scalp, face, chest, and limbs.  Wires from all the electrodes and sensors run from your bed to a computer. The wires can be taken off and put back on if you need to get out of bed to go to the bathroom.  A sensor is placed over your nose to measure airflow.  A finger clip is put on your finger or ear to measure your blood oxygen level (pulse oximetry).  A belt is placed around your belly and a belt is placed around your chest to measure breathing movements.  If you have signs of the sleep disorder called sleep apnea  during your test, you may get a treatment mask to wear for the second half of the night. ? The mask provides positive airway pressure (PAP) to help you breathe better during sleep. This may greatly improve your sleep apnea. ? You will then have all tests done again with the mask in place to see if your measurements and recordings change. After the test  A medical doctor who specializes in sleep will evaluate the results of your sleep study and share them with you and your primary health care provider.  Based on your results, your medical history, and a physical exam, you may be diagnosed with a sleep disorder, such as: ? Sleep apnea. ? Restless legs syndrome. ? Sleep-related behavior disorder. ? Sleep-related movement disorders. ? Sleep-related seizure disorders.  Your health care team will help determine your treatment options based on your diagnosis. This may include: ? Improving your sleep habits (sleep hygiene). ? Wearing a continuous positive airway pressure (CPAP) or bi-level positive airway pressure (BPAP) mask. ? Wearing an oral device at night to improve breathing and reduce snoring. ? Taking medicines. Follow these instructions at home:  Take over-the-counter and prescription medicines only as told by your health care provider.  If you are instructed to use a CPAP or BPAP mask, make sure you use it nightly as directed.  Make any lifestyle changes that your health care provider recommends.  If you were given a device to open your airway while you sleep, use it only as told by your health care provider.  Do not use any tobacco products, such as cigarettes, chewing tobacco, and e-cigarettes. If you need help quitting, ask your health care provider.  Keep all follow-up visits as told by your health care provider. This is important. Summary  A sleep study (polysomnogram) is a series of tests done while you are sleeping. It shows how well you sleep.  Most sleep studies are  done over one full night of sleep. You will arrive at the study center in the evening and go home in the morning.  If you have  signs of the sleep disorder called sleep apnea during your test, you may get a treatment mask to wear for the second half of the night.  A medical doctor who specializes in sleep will evaluate the results of your sleep study and share them with your primary health care provider. This information is not intended to replace advice given to you by your health care provider. Make sure you discuss any questions you have with your health care provider. Document Revised: 08/13/2018 Document Reviewed: 03/26/2017 Elsevier Patient Education  [place].

## 2019-09-16 LAB — LIPID PANEL
Chol/HDL Ratio: 2.9 ratio (ref 0.0–4.4)
Cholesterol, Total: 144 mg/dL (ref 100–199)
HDL: 50 mg/dL (ref >39)
LDL Chol Calc (NIH): 81 mg/dL (ref 0–99)
Triglycerides: 66 mg/dL (ref 0–149)
VLDL Cholesterol Cal: 13 mg/dL (ref 5–40)

## 2019-09-22 ENCOUNTER — Ambulatory Visit (INDEPENDENT_AMBULATORY_CARE_PROVIDER_SITE_OTHER): Payer: 59

## 2019-09-22 DIAGNOSIS — R002 Palpitations: Secondary | ICD-10-CM

## 2019-09-30 ENCOUNTER — Telehealth: Payer: Self-pay | Admitting: *Deleted

## 2019-09-30 ENCOUNTER — Encounter: Payer: Self-pay | Admitting: *Deleted

## 2019-09-30 NOTE — Telephone Encounter (Signed)
Patient given detailed instructions per Myocardial Perfusion Study Information Sheet for the test on 10/07/2019 at 1130. Patient notified to arrive 15 minutes early and that it is imperative to arrive on time for appointment to keep from having the test rescheduled.  If you need to cancel or reschedule your appointment, please call the office within 24 hours of your appointment. . Patient verbalized understanding.[place] Mychart letter sent with instructions.

## 2019-10-07 ENCOUNTER — Other Ambulatory Visit: Payer: Self-pay

## 2019-10-07 ENCOUNTER — Ambulatory Visit (INDEPENDENT_AMBULATORY_CARE_PROVIDER_SITE_OTHER): Payer: 59

## 2019-10-07 DIAGNOSIS — R079 Chest pain, unspecified: Secondary | ICD-10-CM

## 2019-10-07 MED ORDER — REGADENOSON 0.4 MG/5ML IV SOLN
0.4000 mg | Freq: Once | INTRAVENOUS | Status: AC
  Administered 2019-10-07: 0.4 mg via INTRAVENOUS

## 2019-10-07 MED ORDER — TECHNETIUM TC 99M TETROFOSMIN IV KIT
32.5000 | PACK | Freq: Once | INTRAVENOUS | Status: AC | PRN
  Administered 2019-10-07: 32.5 via INTRAVENOUS

## 2019-10-08 ENCOUNTER — Other Ambulatory Visit: Payer: Self-pay

## 2019-10-08 ENCOUNTER — Ambulatory Visit: Payer: 59

## 2019-10-08 LAB — MYOCARDIAL PERFUSION IMAGING
LV dias vol: 80 mL (ref 46–106)
LV sys vol: 24 mL
Peak HR: 109 {beats}/min
Rest HR: 78 {beats}/min
SDS: 6
SRS: 0
SSS: 6
TID: 0.99

## 2019-10-08 MED ORDER — LISINOPRIL-HYDROCHLOROTHIAZIDE 10-12.5 MG PO TABS: 1.0000 | ORAL_TABLET | Freq: Every day | ORAL | 0 refills | Status: DC

## 2019-10-08 MED ORDER — TECHNETIUM TC 99M TETROFOSMIN IV KIT
30.7000 | PACK | Freq: Once | INTRAVENOUS | Status: AC | PRN
  Administered 2019-10-08: 30.7 via INTRAVENOUS

## 2019-10-08 MED ORDER — ATORVASTATIN CALCIUM 10 MG PO TABS: 10.0000 mg | ORAL_TABLET | Freq: Every day | ORAL | 0 refills | Status: DC

## 2019-10-08 NOTE — Telephone Encounter (Signed)
Patient came into office asking about her sleep study that was supposed to be scheduled from her 09-15-19 visit.  She has not heard anything, I informed her we would look into it today and give her a call back.  Patient also requested 1 month refill of Atorvastatin and Lisionopril-Hydrochlorothiazide until she gets to see PCP in 1 month.  Sent 1 month refill to [facility].

## 2019-10-12 ENCOUNTER — Ambulatory Visit: Payer: 59 | Admitting: Cardiology

## 2019-10-12 ENCOUNTER — Other Ambulatory Visit: Payer: Self-pay

## 2019-10-12 ENCOUNTER — Encounter: Payer: Self-pay | Admitting: Cardiology

## 2019-10-12 VITALS — BP 108/78 | HR 87 | Ht 65.0 in | Wt 222.4 lb

## 2019-10-12 DIAGNOSIS — R0789 Other chest pain: Secondary | ICD-10-CM

## 2019-10-12 DIAGNOSIS — E7800 Pure hypercholesterolemia, unspecified: Secondary | ICD-10-CM

## 2019-10-12 DIAGNOSIS — R002 Palpitations: Secondary | ICD-10-CM | POA: Diagnosis not present

## 2019-10-12 DIAGNOSIS — I10 Essential (primary) hypertension: Secondary | ICD-10-CM

## 2019-10-12 MED ORDER — RANOLAZINE ER 500 MG PO TB12: 500.0000 mg | ORAL_TABLET | Freq: Two times a day (BID) | ORAL | 1 refills | Status: DC

## 2019-10-12 MED ORDER — ASPIRIN EC 81 MG PO TBEC
81.0000 mg | DELAYED_RELEASE_TABLET | Freq: Every day | ORAL | 3 refills | Status: DC
Start: 2019-10-12 — End: 2023-12-12

## 2019-10-12 NOTE — Progress Notes (Signed)
Cardiology Office Note:    Date:  10/12/2019   ID:  Yolanda Sanchez, Yolanda Sanchez 17-Feb-1961, MRN [id number]  PCP:  Marge Duncans, PA-C  Cardiologist:  Jenne Campus, MD    Referring MD: Marge Duncans, PA-C   No chief complaint on file. I am still having some chest pain  History of Present Illness:    Yolanda Sanchez is a 59 y.o. female past medical history significant for essential hypertension, obstructive sleep apnea, atypical chest pain.  Also described to have some palpitations.  I did see her a few weeks ago at that time decision has been made to pursue stress testing since she does have some atypical chest pain.  She still complaining of chest pain that is not related to exercise.  Can happen a few times a day.  Lasting for few minutes there is no swelling no shortness of breath associated with this sensation.  Stress test done showed small area of ischemia involving midportion of the anterior septal wall.  We talked in options for this clinical scenario.  We talked about either medical therapy versus cardiac catheterization.  She is still not able to make a decision.  No past medical history on file.  No past surgical history on file.  Current Medications: Current Meds  Medication Sig  . albuterol (VENTOLIN HFA) 108 (90 Base) MCG/ACT inhaler Inhale 2 puffs into the lungs every 6 (six) hours as needed for wheezing or shortness of breath.  Marland Kitchen atorvastatin (LIPITOR) 10 MG tablet Take 1 tablet (10 mg total) by mouth daily.  Marland Kitchen buPROPion (WELLBUTRIN XL) 300 MG 24 hr tablet Take 1 tablet by mouth daily.  . fluticasone (FLONASE) 50 MCG/ACT nasal spray Place 1 spray into both nostrils daily as needed for allergies or rhinitis.  Marland Kitchen lisinopril-hydrochlorothiazide (ZESTORETIC) 10-12.5 MG tablet Take 1 tablet by mouth daily.  . meloxicam (MOBIC) 7.5 MG tablet Take 7.5 mg by mouth daily.  . montelukast (SINGULAIR) 10 MG tablet Take 1 tablet by mouth daily.  . valACYclovir (VALTREX) 500 MG  tablet Take 2 tablets by mouth 2 (two) times daily.     Allergies:   Patient has no known allergies.   Social History   Socioeconomic History  . Marital status: Divorced    Spouse name: Not on file  . Number of children: Not on file  . Years of education: Not on file  . Highest education level: Not on file  Occupational History  . Not on file  Tobacco Use  . Smoking status: Former Research scientist (life sciences)  . Smokeless tobacco: Never Used  Vaping Use  . Vaping Use: Never used  Substance and Sexual Activity  . Alcohol use: Yes  . Drug use: Never  . Sexual activity: Not on file  Other Topics Concern  . Not on file  Social History Narrative  . Not on file   Social Determinants of Health   Financial Resource Strain:   . Difficulty of Paying Living Expenses:   Food Insecurity:   . Worried About Charity fundraiser in the Last Year:   . Arboriculturist in the Last Year:   Transportation Needs:   . Film/video editor (Medical):   Marland Kitchen Lack of Transportation (Non-Medical):   Physical Activity:   . Days of Exercise per Week:   . Minutes of Exercise per Session:   Stress:   . Feeling of Stress :   Social Connections:   . Frequency of Communication with Friends and Family:   .  Frequency of Social Gatherings with Friends and Family:   . Attends Religious Services:   . Active Member of Clubs or Organizations:   . Attends Archivist Meetings:   Marland Kitchen Marital Status:      Family History: The patient's family history includes Colon cancer in her paternal grandfather; Dementia in her paternal grandmother; Depression in her mother; Heart attack in her maternal grandfather and maternal grandmother; Heart disease in her maternal grandmother; Hyperlipidemia in her brother, father, and mother; Hypertension in her brother, brother, and mother; Skin cancer in her father; Sleep apnea in her brother. ROS:   Please see the history of present illness.    All 14 point review of systems negative  except as described per history of present illness  EKGs/Labs/Other Studies Reviewed:      Recent Labs: 07/04/2019: BUN 21; Creatinine, Ser 0.95; Hemoglobin 14.9; Platelets 220; Potassium 3.6; Sodium 138  Recent Lipid Panel    Component Value Date/Time   CHOL 144 09/15/2019 1649   TRIG 66 09/15/2019 1649   HDL 50 09/15/2019 1649   CHOLHDL 2.9 09/15/2019 1649   LDLCALC 81 09/15/2019 1649    Physical Exam:    VS:  BP 108/78 (BP Location: Right Arm, Patient Position: Sitting, Cuff Size: Large)   Pulse 87   Ht 5\' 5"  (1.651 m)   Wt (!) 222 lb 6.4 oz (100.9 kg)   SpO2 98%   BMI 37.01 kg/m     Wt Readings from Last 3 Encounters:  10/12/19 (!) 222 lb 6.4 oz (100.9 kg)  10/07/19 (!) 225 lb (102.1 kg)  09/15/19 225 lb 9.6 oz (102.3 kg)     GEN:  Well nourished, well developed in no acute distress HEENT: Normal NECK: No JVD; No carotid bruits LYMPHATICS: No lymphadenopathy CARDIAC: RRR, no murmurs, no rubs, no gallops RESPIRATORY:  Clear to auscultation without rales, wheezing or rhonchi  ABDOMEN: Soft, non-tender, non-distended MUSCULOSKELETAL:  No edema; No deformity  SKIN: Warm and dry LOWER EXTREMITIES: no swelling NEUROLOGIC:  Alert and oriented x 3 PSYCHIATRIC:  Normal affect   ASSESSMENT:    1. Atypical chest pain   2. Hypercholesterolemia   3. Palpitations   4. Essential hypertension    PLAN:    In order of problems listed above:  1. Atypical chest pain with abnormal stress test showing small area of ischemia involving midportion of the septal wall.  Plan is to initiate medical therapy with ranolazine 5 mg twice daily, I also asked her to start taking baby aspirin every single day, will continue with statin therapy.  I will see her back in my office in about 1 month to continue discussion about potentially doing cardiac catheterization.  She is reluctant to have any cardiac procedures done since her husband died after atrial fibrillation  ablation. 2. Hypercholesterolemia she is taking Lipitor which I will continue.  I do have her fasting lipid profile from 3 weeks ago showing HDL of 50 and a LDL of 81, will continue present management. 3. Palpitations awaiting results of her monitor. 4. Essential hypertension blood pressure is low today and ask her to check blood pressure on the regular basis let me know what it is.   Medication Adjustments/Labs and Tests Ordered: Current medicines are reviewed at length with the patient today.  Concerns regarding medicines are outlined above.  No orders of the defined types were placed in this encounter.  Medication changes: No orders of the defined types were placed in this encounter.  Signed, Park Liter, MD, [facility] 10/12/2019 10:40 AM    [place]

## 2019-10-12 NOTE — Addendum Note (Signed)
Addended by: Ashok Norris on: 10/12/2019 10:53 AM   Modules accepted: Orders

## 2019-10-12 NOTE — Patient Instructions (Signed)
Medication Instructions:  Your physician has recommended you make the following change in your medication:   START: Ranexa 500 mg twice daily   START: Aspirin 81 mg daily   *If you need a refill on your cardiac medications before your next appointment, please call your pharmacy*   Lab Work: NONE.   If you have labs (blood work) drawn today and your tests are completely normal, you will receive your results only by: Marland Kitchen MyChart Message (if you have MyChart) OR . A paper copy in the mail If you have any lab test that is abnormal or we need to change your treatment, we will call you to review the results.   Testing/Procedures: None.    Follow-Up: At [facility], you and your health needs are our priority.  As part of our continuing mission to provide you with exceptional heart care, we have created designated Provider Care Teams.  These Care Teams include your primary Cardiologist (physician) and Advanced Practice Providers (APPs -  Physician Assistants and Nurse Practitioners) who all work together to provide you with the care you need, when you need it.  We recommend signing up for the patient portal called "MyChart".  Sign up information is provided on this After Visit Summary.  MyChart is used to connect with patients for Virtual Visits (Telemedicine).  Patients are able to view lab/test results, encounter notes, upcoming appointments, etc.  Non-urgent messages can be sent to your provider as well.   To learn more about what you can do with MyChart, go to [email or web address].    Your next appointment:   1 month(s)  The format for your next appointment:   In Person  Provider:   Jenne Campus, MD   Other Instructions  Ranolazine tablets, extended release What is this medicine? RANOLAZINE (ra NOE la zeen) is a heart medicine. It is used to treat chronic chest pain (angina). This medicine must be taken regularly. It will not relieve an acute episode of chest  pain. This medicine may be used for other purposes; ask your health care provider or pharmacist if you have questions. COMMON BRAND NAME(S): Ranexa What should I tell my health care provider before I take this medicine? They need to know if you have any of these conditions:  heart disease  irregular heartbeat  kidney disease  liver disease  low levels of potassium or magnesium in the blood  an unusual or allergic reaction to ranolazine, other medicines, foods, dyes, or preservatives  pregnant or trying to get pregnant  breast-feeding How should I use this medicine? Take this medicine by mouth with a glass of water. Follow the directions on the prescription label. Do not cut, crush, or chew this medicine. Take with or without food. Do not take this medication with grapefruit juice. Take your doses at regular intervals. Do not take your medicine more often then directed. Talk to your pediatrician regarding the use of this medicine in children. Special care may be needed. Overdosage: If you think you have taken too much of this medicine contact a poison control center or emergency room at once. NOTE: This medicine is only for you. Do not share this medicine with others. What if I miss a dose? If you miss a dose, take it as soon as you can. If it is almost time for your next dose, take only that dose. Do not take double or extra doses. What may interact with this medicine? Do not take this medicine with any of the following  medications:  antivirals for HIV or AIDS  cerivastatin  certain antibiotics like chloramphenicol, clarithromycin, dalfopristin; quinupristin, isoniazid, rifabutin, rifampin, rifapentine  certain medicines used for cancer like imatinib, nilotinib  certain medicines for fungal infections like fluconazole, itraconazole, ketoconazole, posaconazole, voriconazole  certain medicines for irregular heart beat like dronedarone  certain medicines for seizures like  carbamazepine, fosphenytoin, oxcarbazepine, phenobarbital, phenytoin  cisapride  conivaptan  cyclosporine  grapefruit or grapefruit juice  lumacaftor; ivacaftor  nefazodone  pimozide  quinacrine  St John's wort  thioridazine This medicine may also interact with the following medications:  alfuzosin  certain medicines for depression, anxiety, or psychotic disturbances like bupropion, citalopram, fluoxetine, fluphenazine, paroxetine, perphenazine, risperidone, sertraline, trifluoperazine  certain medicines for cholesterol like atorvastatin, lovastatin, simvastatin  certain medicines for stomach problems like octreotide, palonosetron, prochlorperazine  eplerenone  ergot alkaloids like dihydroergotamine, ergonovine, ergotamine, methylergonovine  metformin  nicardipine  other medicines that prolong the QT interval (cause an abnormal heart rhythm) like dofetilide, ziprasidone  sirolimus  tacrolimus This list may not describe all possible interactions. Give your health care provider a list of all the medicines, herbs, non-prescription drugs, or dietary supplements you use. Also tell them if you smoke, drink alcohol, or use illegal drugs. Some items may interact with your medicine. What should I watch for while using this medicine? Visit your doctor for regular check ups. Tell your doctor or healthcare professional if your symptoms do not start to get better or if they get worse. This medicine will not relieve an acute attack of angina or chest pain. This medicine can change your heart rhythm. Your health care provider may check your heart rhythm by ordering an electrocardiogram (ECG) while you are taking this medicine. You may get drowsy or dizzy. Do not drive, use machinery, or do anything that needs mental alertness until you know how this medicine affects you. Do not stand or sit up quickly, especially if you are an older patient. This reduces the risk of dizzy or fainting  spells. Alcohol may interfere with the effect of this medicine. Avoid alcoholic drinks. If you are scheduled for any medical or dental procedure, tell your healthcare provider that you are taking this medicine. This medicine can interact with other medicines used during surgery. What side effects may I notice from receiving this medicine? Side effects that you should report to your doctor or health care professional as soon as possible:  allergic reactions like skin rash, itching or hives, swelling of the face, lips, or tongue  breathing problems  changes in vision  fast, irregular or pounding heartbeat  feeling faint or lightheaded, falls  low or high blood pressure  numbness or tingling feelings  ringing in the ears  tremor or shakiness  slow heartbeat (fewer than 50 beats per minute)  swelling of the legs or feet Side effects that usually do not require medical attention (report to your doctor or health care professional if they continue or are bothersome):  constipation  drowsy  dry mouth  headache  nausea or vomiting  stomach upset This list may not describe all possible side effects. Call your doctor for medical advice about side effects. You may report side effects to FDA at 1-800-FDA-1088. Where should I keep my medicine? Keep out of the reach of children. Store at room temperature between 15 and 30 degrees C (59 and 86 degrees F). Throw away any unused medicine after the expiration date. NOTE: This sheet is a summary. It may not cover all possible information.  If you have questions about this medicine, talk to your doctor, pharmacist, or health care provider.  2020 Elsevier/Gold Standard (2018-02-18 09:18:49)   Aspirin and Your Heart  Aspirin is a medicine that prevents the cells in the blood that are used for clotting, called platelets, from sticking together. Aspirin can be used to help reduce the risk of blood clots, heart attacks, and other heart-related  problems. Can I take aspirin? Your health care provider will help you determine whether it is safe and beneficial for you to take aspirin daily. Taking aspirin daily may be helpful if you:  Have had a heart attack or chest pain.  Are at risk for a heart attack.  Have undergone open-heart surgery, such as coronary artery bypass surgery (CABG).  Have had coronary angioplasty or a stent.  Have had certain types of stroke or transient ischemic attack (TIA).  Have peripheral artery disease (PAD).  Have chronic heart rhythm problems such as atrial fibrillation and cannot take an anticoagulant.  Have valve disease or have had surgery on a valve. What are the risks? Daily use of aspirin can cause side effects. Some of these include:  Bleeding. Bleeding problems can be minor or serious. An example of a minor problem is a cut that does not stop bleeding. An example of a more serious problem is stomach bleeding or, rarely, bleeding into the brain. Your risk of bleeding is increased if you are also taking non-steroidal anti-inflammatory drugs (NSAIDs).  Increased bruising.  Upset stomach.  An allergic reaction. People who have nasal polyps have an increased risk of developing an aspirin allergy. General guidelines  Take aspirin only as told by your health care provider. Make sure that you understand how much you should take and what form you should take. The two forms of aspirin are: ? Non-enteric-coated.This type of aspirin does not have a coating and is absorbed quickly. This type of aspirin also comes in a chewable form. ? Enteric-coated. This type of aspirin has a coating that releases the medicine very slowly. Enteric-coated aspirin might cause less stomach upset than non-enteric-coated aspirin. This type of aspirin should not be chewed or crushed.  Limit alcohol intake to no more than 1 drink a day for nonpregnant women and 2 drinks a day for men. Drinking alcohol increases your risk of  bleeding. One drink equals 12 oz of beer, 5 oz of wine, or 1 oz of hard liquor. Contact a health care provider if you:  Have unusual bleeding or bruising.  Have stomach pain or nausea.  Have ringing in your ears.  Have an allergic reaction that causes: ? Hives. ? Itchy skin. ? Swelling of the lips, tongue, or face. Get help right away if you:  Notice that your bowel movements are bloody, dark red, or black in color.  Vomit or cough up blood.  Have blood in your urine.  Cough, have noisy breathing (wheeze), or feel short of breath.  Have chest pain, especially if the pain spreads to the arms, back, neck, or jaw.  Have a severe headache, or a headache with confusion, or dizziness. These symptoms may represent a serious problem that is an emergency. Do not wait to see if the symptoms will go away. Get medical help right away. Call your local emergency services (911 in the [place] Do not drive yourself to the hospital. Summary  Aspirin can be used to help reduce the risk of blood clots, heart attacks, and other heart-related problems.  Daily use of  aspirin can increase your risk of side effects. Your health care provider will help you determine whether it is safe and beneficial for you to take aspirin daily.  Take aspirin only as told by your health care provider. Make sure that you understand how much you can take and what form you can take. This information is not intended to replace advice given to you by your health care provider. Make sure you discuss any questions you have with your health care provider. Document Revised: 12/27/2016 Document Reviewed: 12/27/2016 Elsevier Patient Education  2020 [employer].

## 2019-10-15 ENCOUNTER — Telehealth: Payer: Self-pay | Admitting: *Deleted

## 2019-10-15 NOTE — Telephone Encounter (Signed)
PA submitted to [facility] for CPAP titration.

## 2019-10-16 NOTE — Telephone Encounter (Signed)
Miriam from [facility] called regarding the PA for the CPAP Titration. She can be reached at [phone] ext [phone]. Please call.

## 2019-10-19 ENCOUNTER — Telehealth: Payer: Self-pay | Admitting: Cardiology

## 2019-10-19 NOTE — Telephone Encounter (Signed)
Called patient informed her per Dr. Harriet Masson to start coreg 3.125 mg twice daily and stop ranexa for now. Patient didn't want to start anything new because she thinks the reason her blood pressure is high is from the ranexa. Per Dr. Harriet Masson patient informed she can hold off on carvedilol for now and just hold ranexa but if blood pressure doesn't improve she needs to call us back so we can get that under control. She verbally understood. No further questions.

## 2019-10-19 NOTE — Telephone Encounter (Signed)
Pt c/o medication issue:  1. Name of Medication:  ranolazine (RANEXA) 500 MG 12 hr tablet lisinopril-hydrochlorothiazide (ZESTORETIC) 10-12.5 MG tablet 2. How are you currently taking this medication (dosage and times per day)?   3. Are you having a reaction (difficulty breathing--STAT)?   4. What is your medication issue? These two meds are not working well together.  She states her BP is up. 152/102 her last BP check.

## 2019-10-19 NOTE — Telephone Encounter (Addendum)
Called patient. We were waiting for Dr. Agustin Cree to respond tomorrow about stopping ranexa but patient is more concerned because her blood pressure is high. This morning it was 114/77, later 152/102 and while on the phone 168/103. Patient wants to know if she can stop ranexa since she thinks it is making her chest pain worse than normal. I advised her if her chest pain is worse than it normally is then she should be checked I nthe emergency department, but she really thinks it is from the ranexa and wants to know if she can stop it. She denies any current chest pain, dizziness, headaches, or blurred vision. Will consult with DOD since Dr. Agustin Cree is off this pm.

## 2019-10-20 ENCOUNTER — Other Ambulatory Visit: Payer: Self-pay

## 2019-10-20 ENCOUNTER — Emergency Department (HOSPITAL_BASED_OUTPATIENT_CLINIC_OR_DEPARTMENT_OTHER): Payer: 59

## 2019-10-20 ENCOUNTER — Encounter (HOSPITAL_BASED_OUTPATIENT_CLINIC_OR_DEPARTMENT_OTHER): Payer: Self-pay | Admitting: *Deleted

## 2019-10-20 ENCOUNTER — Emergency Department (HOSPITAL_BASED_OUTPATIENT_CLINIC_OR_DEPARTMENT_OTHER)
Admission: EM | Admit: 2019-10-20 | Discharge: 2019-10-20 | Disposition: A | Discharge status: Home / Self Care | Payer: 59 | Attending: Emergency Medicine | Admitting: Emergency Medicine

## 2019-10-20 DIAGNOSIS — Z7982 Long term (current) use of aspirin: Secondary | ICD-10-CM | POA: Insufficient documentation

## 2019-10-20 DIAGNOSIS — Z79899 Other long term (current) drug therapy: Secondary | ICD-10-CM | POA: Insufficient documentation

## 2019-10-20 DIAGNOSIS — R200 Anesthesia of skin: Secondary | ICD-10-CM

## 2019-10-20 DIAGNOSIS — R202 Paresthesia of skin: Secondary | ICD-10-CM | POA: Diagnosis not present

## 2019-10-20 DIAGNOSIS — I10 Essential (primary) hypertension: Secondary | ICD-10-CM | POA: Insufficient documentation

## 2019-10-20 DIAGNOSIS — Z87891 Personal history of nicotine dependence: Secondary | ICD-10-CM | POA: Diagnosis not present

## 2019-10-20 HISTORY — DX: Essential (primary) hypertension: I10

## 2019-10-20 LAB — RAPID URINE DRUG SCREEN, HOSP PERFORMED
Amphetamines: NOT DETECTED
Barbiturates: NOT DETECTED
Benzodiazepines: NOT DETECTED
Cocaine: NOT DETECTED
Opiates: NOT DETECTED
Tetrahydrocannabinol: POSITIVE — AB

## 2019-10-20 LAB — COMPREHENSIVE METABOLIC PANEL
ALT: 17 U/L (ref 0–44)
AST: 19 U/L (ref 15–41)
Albumin: 4.5 g/dL (ref 3.5–5.0)
Alkaline Phosphatase: 55 U/L (ref 38–126)
Anion gap: 7 (ref 5–15)
BUN: 15 mg/dL (ref 6–20)
CO2: 26 mmol/L (ref 22–32)
Calcium: 9.4 mg/dL (ref 8.9–10.3)
Chloride: 102 mmol/L (ref 98–111)
Creatinine, Ser: 0.89 mg/dL (ref 0.44–1.00)
GFR calc Af Amer: >60 mL/min (ref >60)
GFR calc non Af Amer: >60 mL/min (ref >60)
Glucose, Bld: 93 mg/dL (ref 70–99)
Potassium: 3.4 mmol/L — ABNORMAL LOW (ref 3.5–5.1)
Sodium: 135 mmol/L (ref 135–145)
Total Bilirubin: 0.2 mg/dL — ABNORMAL LOW (ref 0.3–1.2)
Total Protein: 7.9 g/dL (ref 6.5–8.1)

## 2019-10-20 LAB — URINALYSIS, ROUTINE W REFLEX MICROSCOPIC
Bilirubin Urine: NEGATIVE
Glucose, UA: NEGATIVE mg/dL
Hgb urine dipstick: NEGATIVE
Ketones, ur: NEGATIVE mg/dL
Leukocytes,Ua: NEGATIVE
Nitrite: NEGATIVE
Protein, ur: NEGATIVE mg/dL
Specific Gravity, Urine: >1.03 — ABNORMAL HIGH (ref 1.005–1.030)
pH: 6 (ref 5.0–8.0)

## 2019-10-20 LAB — CBC WITH DIFFERENTIAL/PLATELET
Abs Immature Granulocytes: 0.01 10*3/uL (ref 0.00–0.07)
Basophils Absolute: 0 10*3/uL (ref 0.0–0.1)
Basophils Relative: 1 %
Eosinophils Absolute: 0 10*3/uL (ref 0.0–0.5)
Eosinophils Relative: 1 %
HCT: 44 % (ref 36.0–46.0)
Hemoglobin: 14.7 g/dL (ref 12.0–15.0)
Immature Granulocytes: 0 %
Lymphocytes Relative: 32 %
Lymphs Abs: 2 10*3/uL (ref 0.7–4.0)
MCH: 31.2 pg (ref 26.0–34.0)
MCHC: 33.4 g/dL (ref 30.0–36.0)
MCV: 93.4 fL (ref 80.0–100.0)
Monocytes Absolute: 0.4 10*3/uL (ref 0.1–1.0)
Monocytes Relative: 7 %
Neutro Abs: 3.8 10*3/uL (ref 1.7–7.7)
Neutrophils Relative %: 59 %
Platelets: 219 10*3/uL (ref 150–400)
RBC: 4.71 MIL/uL (ref 3.87–5.11)
RDW: 12.6 % (ref 11.5–15.5)
WBC: 6.3 10*3/uL (ref 4.0–10.5)
nRBC: 0 % (ref 0.0–0.2)

## 2019-10-20 LAB — PHOSPHORUS: Phosphorus: 3.3 mg/dL (ref 2.5–4.6)

## 2019-10-20 LAB — MAGNESIUM: Magnesium: 2 mg/dL (ref 1.7–2.4)

## 2019-10-20 LAB — SEDIMENTATION RATE: Sed Rate: 14 mm/hr (ref 0–22)

## 2019-10-20 MED ORDER — PREDNISONE 20 MG PO TABS
ORAL_TABLET | ORAL | 0 refills | Status: DC
Start: 2019-10-20 — End: 2019-11-03

## 2019-10-20 MED ORDER — PREDNISONE 50 MG PO TABS
60.0000 mg | ORAL_TABLET | Freq: Once | ORAL | Status: AC
  Administered 2019-10-20: 60 mg via ORAL
  Filled 2019-10-20: qty 1

## 2019-10-20 NOTE — ED Notes (Signed)
Patient transported to X-ray 

## 2019-10-20 NOTE — ED Triage Notes (Signed)
Left facial numbness since last night. No numbness anywhere else.

## 2019-10-20 NOTE — Discharge Instructions (Signed)
1.  You have been given your first dose of prednisone in the emergency department.  You may fill the prescription and start it tomorrow evening.  Resume your Valtrex. 2.  At this time there is suspicion for Bell's palsy.  Continue to observe for changes as described with drooping of the mouth and loss of furrowing of the brow. 3.  Schedule a follow-up appointment with your doctor and the neurologist as soon as possible. 4.  Return to the emergency department if you have progression of symptoms particularly with any difficulty swallowing or other areas of weakness numbness or tingling.

## 2019-10-20 NOTE — ED Provider Notes (Signed)
[facility] EMERGENCY DEPARTMENT Provider Note   CSN: [id number] Arrival date & time: 10/20/19  1438     History Chief Complaint  Patient presents with  . Facial Numbness    Yolanda Sanchez is a 59 y.o. female.  HPI Patient reports yesterday at about 4 PM she started to feel a sensation of numbness on the left face.  It was most noticeable over the zygomatic area of the cheek.  She reports it has persisted and seems to have spread to include more of the lower face and the side of her neck on the left.  She reports over the course of the day she is also started to perceive that there is a kind of tight feeling in the left side of the neck.  No difficulty breathing or swallowing.  No fever or chills.  No headache.  No visual changes.  No gait instability.  No weakness numbness or tingling of the lower extremities.  No dysfunction of the arms.  Patient reports she did have a blister on the inside of her mouth last week that was very carefully checked and evaluated by her doctor for HSV.  She reports it was all normal.  Patient has chronically been taking Valtrex since 2018.  She reports she is not sure if she ever actually needed it.  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  She has not seen any blisters or rashes.  Patient reports he is also been suffering from a lot of anxiety.  She had a stress test in the identified an area of possible ischemia.  She is getting evaluated to do a cardiac catheterization.  She reports that she was put on Ranexa in did very poorly with it.  She reports it made her chest hurt a lot more and it was thus discontinued.    Past Medical History:  Diagnosis Date  . Hypertension     Patient Active Problem List   Diagnosis Date Noted  . Palpitations 09/15/2019  . Atypical chest pain 09/15/2019  . Hypercholesterolemia 09/11/2018  . Anxiety 09/11/2018  . Hypertension 09/11/2018  .  Obstructive sleep apnea 09/11/2018  . Swelling of both lower extremities 09/11/2018    Past Surgical History:  Procedure Laterality Date  . CHOLECYSTECTOMY    . TUBAL LIGATION       OB History   No obstetric history on file.     Family History  Problem Relation Age of Onset  . Hyperlipidemia Mother   . Hypertension Mother   . Depression Mother   . Hyperlipidemia Father   . Skin cancer Father   . Hypertension Brother   . Heart attack Maternal Grandmother   . Heart disease Maternal Grandmother   . Heart attack Maternal Grandfather   . Dementia Paternal Grandmother   . Colon cancer Paternal Grandfather   . Hypertension Brother   . Hyperlipidemia Brother   . Sleep apnea Brother     Social History   Tobacco Use  . Smoking status: Former Research scientist (life sciences)  . Smokeless tobacco: Never Used  Vaping Use  . Vaping Use: Never used  Substance Use Topics  . Alcohol use: Yes  . Drug use: Never    Home Medications Prior to Admission medications   Medication Sig Start Date End Date Taking? Authorizing Provider  albuterol (VENTOLIN HFA) 108 (90 Base) MCG/ACT inhaler Inhale 2 puffs into the lungs every 6 (six) hours as needed  for wheezing or shortness of breath.    [provider]  aspirin EC 81 MG tablet Take 1 tablet (81 mg total) by mouth daily. Swallow whole. 10/12/19   Park Liter, MD  atorvastatin (LIPITOR) 10 MG tablet Take 1 tablet (10 mg total) by mouth daily. 10/08/19   Park Liter, MD  buPROPion (WELLBUTRIN XL) 300 MG 24 hr tablet Take 1 tablet by mouth daily. 07/21/18   [provider]  fluticasone (FLONASE) 50 MCG/ACT nasal spray Place 1 spray into both nostrils daily as needed for allergies or rhinitis.    [provider]  lisinopril-hydrochlorothiazide (ZESTORETIC) 10-12.5 MG tablet Take 1 tablet by mouth daily. 10/08/19   Park Liter, MD  meloxicam (MOBIC) 7.5 MG tablet Take 7.5 mg by mouth daily.    [provider]   montelukast (SINGULAIR) 10 MG tablet Take 1 tablet by mouth daily. 08/13/18   [provider]  predniSONE (DELTASONE) 20 MG tablet 2 tabs po day one and two, then 1 po daily x 4 days 10/20/19   Charlesetta Shanks, MD  ranolazine (RANEXA) 500 MG 12 hr tablet Take 1 tablet (500 mg total) by mouth 2 (two) times daily. 10/12/19   Park Liter, MD  valACYclovir (VALTREX) 500 MG tablet Take 2 tablets by mouth 2 (two) times daily. 08/13/18   [provider]    Allergies    Patient has no known allergies.  Review of Systems   Review of Systems 10 systems reviewed and negative except as per HPI Physical Exam Updated Vital Signs BP 137/77 (BP Location: Right Arm)   Pulse 74   Temp 98.3 F (36.8 C) (Oral)   Resp 16   Ht 5\' 5"  (1.651 m)   Wt 99.8 kg   SpO2 100%   BMI 36.61 kg/m   Physical Exam Constitutional:      Appearance: Normal appearance.  HENT:     Head: Normocephalic and atraumatic.     Right Ear: Tympanic membrane normal.     Left Ear: Tympanic membrane, ear canal and external ear normal.     Nose: Nose normal.     Mouth/Throat:     Mouth: Mucous membranes are moist.     Pharynx: Oropharynx is clear.  Eyes:     Extraocular Movements: Extraocular movements intact.     Conjunctiva/sclera: Conjunctivae normal.     Pupils: Pupils are equal, round, and reactive to light.  Cardiovascular:     Rate and Rhythm: Normal rate and regular rhythm.     Pulses: Normal pulses.     Heart sounds: Normal heart sounds.  Pulmonary:     Effort: Pulmonary effort is normal.     Breath sounds: Normal breath sounds.  Abdominal:     Palpations: Abdomen is soft.  Musculoskeletal:        General: No swelling or tenderness. Normal range of motion.     Cervical back: Neck supple. No rigidity.     Right lower leg: No edema.     Left lower leg: No edema.  Lymphadenopathy:     Cervical: No cervical adenopathy.  Skin:    General: Skin is warm and dry.  Neurological:     General:  No focal deficit present.     Mental Status: She is alert and oriented to person, place, and time.     Cranial Nerves: No cranial nerve deficit.     Motor: No weakness.     Coordination: Coordination normal.  Comments: Patient endorses is difference to the light touch on the left side of the face from below the eye to the side of the neck.  Motor strength upper extremities 5\5.  Finger-nose exam normal.  Psychiatric:     Comments: Anxious and sometimes tearful.     ED Results / Procedures / Treatments   Labs (all labs ordered are listed, but only abnormal results are displayed) Labs Reviewed  COMPREHENSIVE METABOLIC PANEL - Abnormal; Notable for the following components:      Result Value   Potassium 3.4 (*)    Total Bilirubin 0.2 (*)    All other components within normal limits  URINALYSIS, ROUTINE W REFLEX MICROSCOPIC - Abnormal; Notable for the following components:   Specific Gravity, Urine >1.030 (*)    All other components within normal limits  RAPID URINE DRUG SCREEN, HOSP PERFORMED - Abnormal; Notable for the following components:   Tetrahydrocannabinol POSITIVE (*)    All other components within normal limits  CBC WITH DIFFERENTIAL/PLATELET  MAGNESIUM  PHOSPHORUS  SEDIMENTATION RATE    EKG EKG Interpretation  Date/Time:  Tuesday October 20 2019 14:48:41 EDT Ventricular Rate:  85 PR Interval:  170 QRS Duration: 88 QT Interval:  344 QTC Calculation: 409 R Axis:   59 Text Interpretation: Normal sinus rhythm Normal ECG agree, no change Confirmed by Charlesetta Shanks [phone]) on 10/20/2019 3:45:38 PM   Radiology DG Neck Soft Tissue  Result Date: 10/20/2019 CLINICAL DATA:  Tight feeling left throat. EXAM: NECK SOFT TISSUES - 1+ VIEW COMPARISON:  None. FINDINGS: There is no evidence of retropharyngeal soft tissue swelling or epiglottic enlargement. The cervical airway is unremarkable. No radio-opaque foreign body identified. The soft tissue planes are non suspicious.  IMPRESSION: Negative soft tissue neck radiographs. Electronically Signed   By: Keith Rake M.D.   On: 10/20/2019 16:33    Procedures Procedures (including critical care time)  Medications Ordered in ED Medications  predniSONE (DELTASONE) tablet 60 mg (60 mg Oral Given 10/20/19 1842)    ED Course  I have reviewed the triage vital signs and the nursing notes.  Pertinent labs & imaging results that were available during my care of the patient were reviewed by me and considered in my medical decision making (see chart for details).    MDM Rules/Calculators/A&P                         Patient presents with left-sided facial numbness.  This started particularly on the cheek and now she perceives more sensation of numbness on the side of the face down to the chin and side of the neck.  Objectively I cannot appreciate any facial droop.  The oral and neck exam is normal.  Patient now starts to feel that she is getting some degree of tearing of the left eye although this is not apparent to examination.  Patient has been chronically on Valtrex.  I will have her continue the Valtrex and give a short dose of prednisone.  At this time, symptoms are presumed early Bell's palsy.  Patient is very concerned and wishes to start prednisone at this time despite lack of objective exam findings.  At this time will proceed with that management and careful return precautions and follow-up have been reviewed. Final Clinical Impression(s) / ED Diagnoses Final diagnoses:  Left facial numbness    Rx / DC Orders ED Discharge Orders         Ordered    predniSONE (  DELTASONE) 20 MG tablet     Discontinue  Reprint     10/20/19 1845    Ambulatory referral to Neurology     Discontinue  Reprint    Comments: An appointment is requested in approximately: 1 week   10/20/19 1846           Charlesetta Shanks, MD 10/20/19 1851

## 2019-10-20 NOTE — ED Notes (Signed)
ED Provider at bedside. 

## 2019-10-21 NOTE — Progress Notes (Signed)
[facility]    Provider:  Dr Jaynee Eagles Requesting Provider: Charlesetta Shanks, MD Primary Care Provider:  Marge Duncans, PA-C  CC:  Facial numbness  HPI:  Yolanda Sanchez is a 59 y.o. female here as requested by  Charlesetta Shanks, MD for facial numbness.  She has a past medical history of palpitations and atypical chest pain, hypercholesterolemia, anxiety, hypertension, obstructive sleep apnea and swelling of both lower extremities.  I reviewed Charlesetta Shanks, MD's notes: Patient was seen at the emergency room October 20, 2019 after starting to feel a sensation of numbness in the left face, most noticeable over the zygomatic arch of the cheek, persisted and spread to include more of the lower face and the side of her neck on the left, of the course the day she also started feeling tightness in the left side of the neck, no difficulty swallowing or breathing, no fevers or chills, no headache, no visual changes, no weakness numbness or tingling of the lower extremities, she did have a blister on the inside of her mouth last week that was carefully checked and evaluated by her doctor for HSV and 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  Also recently suffering a lot of anxiety.  Examination showed subjective difference to light touch in the left side of the face from below the eye to the side of the neck, otherwise nonfocal, motor 5 out of 5, patient was anxious and sometimes tearful.  Labs showed positive THC otherwise CBC, CMP, unremarkable.  She was started on steroids, continued on Valtrex, presumed early Bell's palsy, no objective exam findings. Sed rate was normal.   Patient is here alone and reports acute onset facial numbness, decreased sensation, feels like dental anesthesia, it in her eye, as well as blurred vision, started a few weeks ago also develops her cheek, ear, into the neck and back of the throat, she has  to clear her throat a bunch, she is having trouble swallowing food, also reports left eye twitching. Back in April she had an episode of dizziness, chest pain, she had a workup and CT scan of the head and everything was normal. She was put on medications Renexa and BP medications, her BP shot up to 163/103 a few days ago and at the same time she had numbness on the left side of the faceShe is having eye pain and it feels tired, watering, overly dry, anesthesias on the left cheek, loss of sensation down the throat and difficulty slobering and loss of sensation subjectively. She feels she can;t swallow. She has spit collecting on the left side of her mouth and weakness on the left side of the face. Monocular blurry vision. She is havng eye twitching. The vision changed prior to the numbness which started on the 9th (was in the ED 10th), the eye issue started happening a few weeks ago unclear if connected. No other rashes, lesions of the genitals.    Reviewed notes, labs and imaging from outside physicians, which showed:    CT head 06/2019: showed No acute intracranial abnormalities including mass lesion or mass effect, hydrocephalus, extra-axial fluid collection, midline shift, hemorrhage, or acute infarction, large ischemic events (personally reviewed images)     Review of Systems: Patient complains of symptoms per HPI as well as the following symptoms: numbness, anxiety. Pertinent negatives and positives per HPI. All others negative.   Social History   Socioeconomic  History  . Marital status: Divorced    Spouse name: Not on file  . Number of children: Not on file  . Years of education: Not on file  . Highest education level: Not on file  Occupational History  . Not on file  Tobacco Use  . Smoking status: Former Smoker    Quit date: 2019    Years since quitting: 2.6  . Smokeless tobacco: Never Used  . Tobacco comment: on and off for 20 years   Vaping Use  . Vaping Use: Former    Substance and Sexual Activity  . Alcohol use: Yes    Alcohol/week: 1.0 - 2.0 standard drink    Types: 1 - 2 Standard drinks or equivalent per week  . Drug use: Yes    Types: Marijuana    Comment: at night for insomnia sometimes   . Sexual activity: Not on file  Other Topics Concern  . Not on file  Social History Narrative   Lives alone    Right handed   Caffeine: 1 cup/day   Social Determinants of Health   Financial Resource Strain:   . Difficulty of Paying Living Expenses:   Food Insecurity:   . Worried About Charity fundraiser in the Last Year:   . Arboriculturist in the Last Year:   Transportation Needs:   . Film/video editor (Medical):   Marland Kitchen Lack of Transportation (Non-Medical):   Physical Activity:   . Days of Exercise per Week:   . Minutes of Exercise per Session:   Stress:   . Feeling of Stress :   Social Connections:   . Frequency of Communication with Friends and Family:   . Frequency of Social Gatherings with Friends and Family:   . Attends Religious Services:   . Active Member of Clubs or Organizations:   . Attends Archivist Meetings:   Marland Kitchen Marital Status:   Intimate Partner Violence:   . Fear of Current or Ex-Partner:   . Emotionally Abused:   Marland Kitchen Physically Abused:   . Sexually Abused:     Family History  Problem Relation Age of Onset  . Hyperlipidemia Mother   . Hypertension Mother   . Depression Mother   . Hyperlipidemia Father   . Skin cancer Father   . Hypertension Brother   . Diabetes Brother   . Heart attack Maternal Grandmother   . Heart disease Maternal Grandmother   . Heart attack Maternal Grandfather   . Dementia Paternal Grandmother   . Colon cancer Paternal Grandfather   . Hypertension Brother   . Hyperlipidemia Brother   . Sleep apnea Brother   . Diabetes Brother   . Diabetes Son     Past Medical History:  Diagnosis Date  . Anxiety   . Depression   . HSV infection    1 & 2   . Hypertension     Patient  Active Problem List   Diagnosis Date Noted  . Left facial numbness 10/25/2019  . Palpitations 09/15/2019  . Atypical chest pain 09/15/2019  . Hypercholesterolemia 09/11/2018  . Anxiety 09/11/2018  . Hypertension 09/11/2018  . Obstructive sleep apnea 09/11/2018  . Swelling of both lower extremities 09/11/2018    Past Surgical History:  Procedure Laterality Date  . 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 CHOLECYSTECTOMY    . COLONOSCOPY    . TUBAL LIGATION      Current  Outpatient Medications  Medication Sig Dispense Refill  . albuterol (VENTOLIN HFA) 108 (90 Base) MCG/ACT inhaler Inhale 2 puffs into the lungs every 6 (six) hours as needed for wheezing or shortness of breath.    Marland Kitchen aspirin EC 81 MG tablet Take 1 tablet (81 mg total) by mouth daily. Swallow whole. 90 tablet 3  . atorvastatin (LIPITOR) 10 MG tablet Take 1 tablet (10 mg total) by mouth daily. 30 tablet 0  . buPROPion (WELLBUTRIN XL) 300 MG 24 hr tablet Take 1 tablet by mouth daily.    . fluticasone (FLONASE) 50 MCG/ACT nasal spray Place 1 spray into both nostrils daily as needed for allergies or rhinitis.    Marland Kitchen lisinopril-hydrochlorothiazide (ZESTORETIC) 10-12.5 MG tablet Take 1 tablet by mouth daily. 30 tablet 0  . meloxicam (MOBIC) 7.5 MG tablet Take 7.5 mg by mouth daily. As needed.    . montelukast (SINGULAIR) 10 MG tablet Take 1 tablet by mouth daily.    . predniSONE (DELTASONE) 20 MG tablet 2 tabs po day one and two, then 1 po daily x 4 days 11 tablet 0  . valACYclovir (VALTREX) 500 MG tablet Take 2 tablets by mouth 2 (two) times daily.     No current facility-administered medications for this visit.    Allergies as of 10/22/2019  . (No Known Allergies)    Vitals: BP 132/88 (BP Location: Right Arm, Patient Position: Sitting)   Pulse 84   Ht 5\' 5"  (1.651 m)   Wt 221 lb (100.2 kg)   BMI 36.78 kg/m  Last Weight:  Wt Readings from Last 1 Encounters:  10/22/19 221 lb (100.2  kg)   Last Height:   Ht Readings from Last 1 Encounters:  10/22/19 5\' 5"  (1.651 m)     Physical exam: Exam: Gen: NAD, anxious, well nourised, obese, well groomed                     CV: RRR, no MRG. No Carotid Bruits. No peripheral edema, warm, nontender Eyes: Conjunctivae clear without exudates or hemorrhage  Neuro: Detailed Neurologic Exam  Speech:    Speech is normal; fluent and spontaneous with normal comprehension.  Cognition:    The patient is oriented to person, place, and time;     recent and remote memory intact;     language fluent;     normal attention, concentration,     fund of knowledge Cranial Nerves:    The pupils are equal, round, and reactive to light. The fundi are flat. Visual fields are full to finger confrontation. Extraocular movements are intact. Trigeminal sensation is decreased left V2 but the muscles of mastication are normal. The face is symmetric. The palate elevates in the midline. Hearing intact. Voice is normal. Shoulder shrug is normal. The tongue has normal motion without fasciculations.   Coordination:    Normal finger to nose and heel to shin.  Gait:    Heel-toe intact, imbalance slightly with tandem, not ataxic.   Motor Observation:    No asymmetry, no atrophy, and no involuntary movements noted. Tone:    Normal muscle tone.    Posture:    Posture is normal. normal erect    Strength:    Strength is V/V in the upper and lower limbs.      Sensation: intact to LT     Reflex Exam:  DTR's:    Deep tendon reflexes in the upper and lower extremities are normal bilaterally.   Toes:  The toes are downgoing bilaterally.   Clonus:    Clonus is absent.    Assessment/Plan:  Patient with multiple acute to subacute symptoms of left monocular vision loss, left facial numbness, left facial weakness, dysphagia, hearing changes on the left, left eye pain and temporal pain. Differential is wide need MRI of the brain for demyelinating lesions,  stroke, compressive mass, intracranial hypertension, csf leak, or other. Please see eye doctor asap. Suspect anxiety may be contributory.   Orders Placed This Encounter  Procedures  . MR BRAIN W WO CONTRAST    Cc: Marge Duncans, PA-C  Will see what MRI of the brain shows prior to follow up  Sarina Ill, MD  [facility] [address] [place], [place] [address]  Phone [phone] Fax [phone]

## 2019-10-21 NOTE — Telephone Encounter (Signed)
Called  Yolanda Sanchez to discuss  Concern of taking prednisone. This is resolved but she stated that the MD  Had stated she was going to change her BP medication. Looked through the chart, no sign of BP medchange. Told patient to follow her BP talk to primary ( she has a appt on August 24th) and cardiology a ( appt at the end of the month) She is taking prednisone as directed.

## 2019-10-22 ENCOUNTER — Telehealth: Payer: Self-pay | Admitting: Neurology

## 2019-10-22 ENCOUNTER — Encounter: Payer: Self-pay | Admitting: Neurology

## 2019-10-22 ENCOUNTER — Ambulatory Visit: Payer: 59 | Admitting: Neurology

## 2019-10-22 VITALS — BP 132/88 | HR 84 | Ht 65.0 in | Wt 221.0 lb

## 2019-10-22 DIAGNOSIS — R29810 Facial weakness: Secondary | ICD-10-CM

## 2019-10-22 DIAGNOSIS — H9190 Unspecified hearing loss, unspecified ear: Secondary | ICD-10-CM | POA: Diagnosis not present

## 2019-10-22 DIAGNOSIS — H5460 Unqualified visual loss, one eye, unspecified: Secondary | ICD-10-CM | POA: Diagnosis not present

## 2019-10-22 DIAGNOSIS — R1310 Dysphagia, unspecified: Secondary | ICD-10-CM

## 2019-10-22 DIAGNOSIS — H5712 Ocular pain, left eye: Secondary | ICD-10-CM

## 2019-10-22 DIAGNOSIS — H539 Unspecified visual disturbance: Secondary | ICD-10-CM | POA: Diagnosis not present

## 2019-10-22 DIAGNOSIS — R200 Anesthesia of skin: Secondary | ICD-10-CM | POA: Diagnosis not present

## 2019-10-22 DIAGNOSIS — H5462 Unqualified visual loss, left eye, normal vision right eye: Secondary | ICD-10-CM

## 2019-10-22 DIAGNOSIS — R519 Headache, unspecified: Secondary | ICD-10-CM

## 2019-10-22 NOTE — Telephone Encounter (Signed)
[employer] Josem Kaufmann: [id number] (exp. 10/22/19 to 12/06/19). I emailed Fish farm manager at [facility] if she can get the patient in soon.

## 2019-10-22 NOTE — Patient Instructions (Signed)
MRI of the brain w/wo contrast 

## 2019-10-25 ENCOUNTER — Other Ambulatory Visit: Payer: Self-pay

## 2019-10-25 ENCOUNTER — Ambulatory Visit
Admission: RE | Admit: 2019-10-25 | Discharge: 2019-10-25 | Disposition: A | Discharge status: Home / Self Care | Payer: 59 | Source: Ambulatory Visit | Attending: Neurology | Admitting: Neurology

## 2019-10-25 ENCOUNTER — Encounter: Payer: Self-pay | Admitting: Neurology

## 2019-10-25 DIAGNOSIS — H539 Unspecified visual disturbance: Secondary | ICD-10-CM

## 2019-10-25 DIAGNOSIS — H5462 Unqualified visual loss, left eye, normal vision right eye: Secondary | ICD-10-CM

## 2019-10-25 DIAGNOSIS — H5460 Unqualified visual loss, one eye, unspecified: Secondary | ICD-10-CM

## 2019-10-25 DIAGNOSIS — R29810 Facial weakness: Secondary | ICD-10-CM

## 2019-10-25 DIAGNOSIS — R1310 Dysphagia, unspecified: Secondary | ICD-10-CM

## 2019-10-25 DIAGNOSIS — R200 Anesthesia of skin: Secondary | ICD-10-CM

## 2019-10-25 DIAGNOSIS — H9190 Unspecified hearing loss, unspecified ear: Secondary | ICD-10-CM

## 2019-10-25 DIAGNOSIS — R519 Headache, unspecified: Secondary | ICD-10-CM

## 2019-10-25 DIAGNOSIS — H5712 Ocular pain, left eye: Secondary | ICD-10-CM

## 2019-10-25 HISTORY — DX: Anesthesia of skin: R20.0

## 2019-10-25 MED ORDER — GADOBENATE DIMEGLUMINE 529 MG/ML IV SOLN
15.0000 mL | Freq: Once | INTRAVENOUS | Status: AC | PRN
  Administered 2019-10-25: 15 mL via INTRAVENOUS

## 2019-10-26 ENCOUNTER — Other Ambulatory Visit: Payer: Self-pay | Admitting: Neurology

## 2019-10-26 DIAGNOSIS — R200 Anesthesia of skin: Secondary | ICD-10-CM

## 2019-10-26 DIAGNOSIS — R4701 Aphasia: Secondary | ICD-10-CM

## 2019-10-26 DIAGNOSIS — R471 Dysarthria and anarthria: Secondary | ICD-10-CM

## 2019-10-26 DIAGNOSIS — R29898 Other symptoms and signs involving the musculoskeletal system: Secondary | ICD-10-CM

## 2019-10-27 ENCOUNTER — Telehealth: Payer: Self-pay | Admitting: Neurology

## 2019-10-27 ENCOUNTER — Telehealth: Payer: Self-pay

## 2019-10-27 ENCOUNTER — Encounter: Payer: Self-pay | Admitting: Physician Assistant

## 2019-10-27 NOTE — Telephone Encounter (Signed)
Patient returned my call she is scheduled at [facility] for 10/28/19.

## 2019-10-27 NOTE — Telephone Encounter (Signed)
LVM for pt to call back about scheduling [facility] Auth: [id number] (exp. 10/27/19 to 12/11/19)

## 2019-10-27 NOTE — Telephone Encounter (Signed)
-----   Message from Park Liter, MD sent at 10/26/2019  1:57 PM EDT ----- Monitor showed show APCs and PVCs but multiple symptoms for sinus rhythm.  It is a benign monitor

## 2019-10-27 NOTE — Telephone Encounter (Signed)
I spoke with the patient she is scheduled for 10/28/19 at [facility].

## 2019-10-27 NOTE — Telephone Encounter (Signed)
Spoke with patient regarding results and recommendation.  Patient verbalizes understanding and is agreeable to plan of care. Advised patient to call back with any issues or concerns.  

## 2019-10-28 ENCOUNTER — Telehealth: Payer: Self-pay | Admitting: *Deleted

## 2019-10-28 ENCOUNTER — Ambulatory Visit (INDEPENDENT_AMBULATORY_CARE_PROVIDER_SITE_OTHER): Payer: 59

## 2019-10-28 ENCOUNTER — Other Ambulatory Visit: Payer: Self-pay

## 2019-10-28 DIAGNOSIS — R4701 Aphasia: Secondary | ICD-10-CM

## 2019-10-28 DIAGNOSIS — R29898 Other symptoms and signs involving the musculoskeletal system: Secondary | ICD-10-CM

## 2019-10-28 DIAGNOSIS — R200 Anesthesia of skin: Secondary | ICD-10-CM

## 2019-10-28 DIAGNOSIS — R471 Dysarthria and anarthria: Secondary | ICD-10-CM

## 2019-10-28 DIAGNOSIS — R202 Paresthesia of skin: Secondary | ICD-10-CM

## 2019-10-28 NOTE — Telephone Encounter (Signed)
Staff message sent to [facility] in lab sleep study has been denied by [facility]. Provider can order HST. No PA is required.

## 2019-11-03 ENCOUNTER — Other Ambulatory Visit: Payer: Self-pay

## 2019-11-03 ENCOUNTER — Ambulatory Visit: Payer: 59 | Admitting: Physician Assistant

## 2019-11-03 ENCOUNTER — Encounter: Payer: Self-pay | Admitting: Physician Assistant

## 2019-11-03 VITALS — BP 110/68 | HR 84 | Temp 97.3°F | Ht 65.0 in | Wt 219.0 lb

## 2019-11-03 DIAGNOSIS — F419 Anxiety disorder, unspecified: Secondary | ICD-10-CM

## 2019-11-03 DIAGNOSIS — R202 Paresthesia of skin: Secondary | ICD-10-CM

## 2019-11-03 DIAGNOSIS — R269 Unspecified abnormalities of gait and mobility: Secondary | ICD-10-CM

## 2019-11-03 DIAGNOSIS — E782 Mixed hyperlipidemia: Secondary | ICD-10-CM

## 2019-11-03 DIAGNOSIS — I10 Essential (primary) hypertension: Secondary | ICD-10-CM

## 2019-11-03 HISTORY — DX: Unspecified abnormalities of gait and mobility: R26.9

## 2019-11-03 HISTORY — DX: Mixed hyperlipidemia: E78.2

## 2019-11-03 HISTORY — DX: Paresthesia of skin: R20.2

## 2019-11-03 NOTE — Assessment & Plan Note (Addendum)
Referral to Dr Posey Pronto for second opinion Recent cbc, cmp, and lipid normal --- will order thyroid panel with TSH

## 2019-11-03 NOTE — Assessment & Plan Note (Signed)
Referral to neuro

## 2019-11-03 NOTE — Assessment & Plan Note (Signed)
Well controlled. No changes to medicines.  Continue to work on eating a healthy diet and exercise.    

## 2019-11-03 NOTE — Assessment & Plan Note (Signed)
Continue current meds as directed Follow up in 3 months

## 2019-11-03 NOTE — Progress Notes (Signed)
New Patient Office Visit  Subjective:  Patient ID: Yolanda Sanchez, female    DOB: 1961/03/01  Age: 59 y.o. MRN: [id number]  CC:  Chief Complaint  Patient presents with  . New to establish    Go over medications    HPI Yolanda Sanchez presents for hypertension  Pt states that she has had hypertension for over 3 years - has been stable on current meds and taking zestoretic 10/12.5mg  qd Denies chest pain/sob/edema  Pt with history of hyperlipidemia- she is currently taking lipitor 10mg  qd - voices no problems or concerns - is trying to watch diet  Pt with history of allergies - current meds include albuterol as needed - fluticasone and singulair  Pt states that she has had symptoms of facial paresthesias and gait abnormality for several months - she has also had vision changes as well as speech abnormalities and forgetfulness- has had MRIs of neck and brain and seen neurologist - states she is still concerned with her symptoms and has not 'gotten any answers' - would like a second opinion - would like to see Dr Posey Pronto  Past Medical History:  Diagnosis Date  . Anxiety   . Depression   . HSV infection    1 & 2   . Hypertension     Past Surgical History:  Procedure Laterality Date  . 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 CHOLECYSTECTOMY    . COLONOSCOPY    . TUBAL LIGATION      Family History  Problem Relation Age of Onset  . Hyperlipidemia Mother   . Hypertension Mother   . Depression Mother   . Hyperlipidemia Father   . Skin cancer Father   . Hypertension Brother   . Diabetes Brother   . Heart attack Maternal Grandmother   . Heart disease Maternal Grandmother   . Heart attack Maternal Grandfather   . Dementia Paternal Grandmother   . Colon cancer Paternal Grandfather   . Hypertension Brother   . Hyperlipidemia Brother   . Sleep apnea Brother   . Diabetes Brother   . Diabetes Son     Social History    Socioeconomic History  . Marital status: Divorced    Spouse name: Not on file  . Number of children: Not on file  . Years of education: Not on file  . Highest education level: Not on file  Occupational History  . Not on file  Tobacco Use  . Smoking status: Former Smoker    Quit date: 2019    Years since quitting: 2.6  . Smokeless tobacco: Never Used  . Tobacco comment: on and off for 20 years   Vaping Use  . Vaping Use: Former  Substance and Sexual Activity  . Alcohol use: Yes    Alcohol/week: 1.0 - 2.0 standard drink    Types: 1 - 2 Standard drinks or equivalent per week  . Drug use: Yes    Types: Marijuana    Comment: at night for insomnia sometimes   . Sexual activity: Not on file  Other Topics Concern  . Not on file  Social History Narrative   Lives alone    Right handed   Caffeine: 1 cup/day   Social Determinants of Health   Financial Resource Strain:   . Difficulty of Paying Living Expenses: Not on file  Food Insecurity:   . Worried About Charity fundraiser in the Last Year:  Not on file  . Ran Out of Food in the Last Year: Not on file  Transportation Needs:   . Lack of Transportation (Medical): Not on file  . Lack of Transportation (Non-Medical): Not on file  Physical Activity:   . Days of Exercise per Week: Not on file  . Minutes of Exercise per Session: Not on file  Stress:   . Feeling of Stress : Not on file  Social Connections:   . Frequency of Communication with Friends and Family: Not on file  . Frequency of Social Gatherings with Friends and Family: Not on file  . Attends Religious Services: Not on file  . Active Member of Clubs or Organizations: Not on file  . Attends Archivist Meetings: Not on file  . Marital Status: Not on file  Intimate Partner Violence:   . Fear of Current or Ex-Partner: Not on file  . Emotionally Abused: Not on file  . Physically Abused: Not on file  . Sexually Abused: Not on file     Current Outpatient  Medications:  .  albuterol (VENTOLIN HFA) 108 (90 Base) MCG/ACT inhaler, Inhale 2 puffs into the lungs every 6 (six) hours as needed for wheezing or shortness of breath., Disp: , Rfl:  .  aspirin EC 81 MG tablet, Take 1 tablet (81 mg total) by mouth daily. Swallow whole., Disp: 90 tablet, Rfl: 3 .  atorvastatin (LIPITOR) 10 MG tablet, Take 1 tablet (10 mg total) by mouth daily., Disp: 30 tablet, Rfl: 0 .  buPROPion (WELLBUTRIN XL) 300 MG 24 hr tablet, Take 1 tablet by mouth daily., Disp: , Rfl:  .  fluticasone (FLONASE) 50 MCG/ACT nasal spray, Place 1 spray into both nostrils daily as needed for allergies or rhinitis., Disp: , Rfl:  .  lisinopril-hydrochlorothiazide (ZESTORETIC) 10-12.5 MG tablet, Take 1 tablet by mouth daily., Disp: 30 tablet, Rfl: 0 .  meloxicam (MOBIC) 7.5 MG tablet, Take 7.5 mg by mouth daily. As needed., Disp: , Rfl:  .  montelukast (SINGULAIR) 10 MG tablet, Take 1 tablet by mouth daily., Disp: , Rfl:  .  valACYclovir (VALTREX) 1000 MG tablet, Take 1,000 mg by mouth daily., Disp: , Rfl:    No Known Allergies  ROS CONSTITUTIONAL: Negative for chills, fatigue, fever, unintentional weight gain and unintentional weight loss.  CARDIOVASCULAR: Negative for chest pain, dizziness, palpitations and pedal edema.  RESPIRATORY: Negative for recent cough and dyspnea.  GASTROINTESTINAL: Negative for abdominal pain, acid reflux symptoms, constipation, diarrhea, nausea and vomiting.  MSK: Negative for arthralgias and myalgias.  INTEGUMENTARY: Negative for rash.  NEUROLOGICAL: see HPI PSYCHIATRIC:see HPI       Objective:    PHYSICAL EXAM:   VS: BP 110/68 (BP Location: Left Arm, Patient Position: Sitting)   Pulse 84   Temp (!) 97.3 F (36.3 C) (Temporal)   Ht 5\' 5"  (1.651 m)   Wt 219 lb (99.3 kg)   SpO2 98%   BMI 36.44 kg/m   GEN: Well nourished, well developed, in no acute distress  Cardiac: RRR; no murmurs, rubs, or gallops,no edema -  Respiratory:  normal respiratory  rate and pattern with no distress - normal breath sounds with no rales, rhonchi, wheezes or rubs MS: no deformity or atrophy  Skin: warm and dry, no rash  Neuro:  Alert and Oriented x 3,  - CN II-Xii grossly intact - gait normal today Psych: euthymic mood, appropriate affect and demeanor   LABWORK DONE RECENTLY WAS NORMAL - CBC, CMP, LIPID -  SEE IN MY CHART BP 110/68 (BP Location: Left Arm, Patient Position: Sitting)   Pulse 84   Temp (!) 97.3 F (36.3 C) (Temporal)   Ht 5\' 5"  (1.651 m)   Wt 219 lb (99.3 kg)   SpO2 98%   BMI 36.44 kg/m  Wt Readings from Last 3 Encounters:  11/03/19 219 lb (99.3 kg)  10/22/19 221 lb (100.2 kg)  10/20/19 220 lb (99.8 kg)     Health Maintenance Due  Topic Date Due  . Hepatitis C Screening  Never done  . HIV Screening  Never done  . TETANUS/TDAP  Never done  . COLONOSCOPY  Never done  . PAP SMEAR-Modifier  06/30/2017  . MAMMOGRAM  02/13/2018  . INFLUENZA VACCINE  10/11/2019    There are no preventive care reminders to display for this patient.  No results found for: TSH Lab Results  Component Value Date   WBC 6.3 10/20/2019   HGB 14.7 10/20/2019   HCT 44.0 10/20/2019   MCV 93.4 10/20/2019   PLT 219 10/20/2019   Lab Results  Component Value Date   NA 135 10/20/2019   K 3.4 (L) 10/20/2019   CO2 26 10/20/2019   GLUCOSE 93 10/20/2019   BUN 15 10/20/2019   CREATININE 0.89 10/20/2019   BILITOT 0.2 (L) 10/20/2019   ALKPHOS 55 10/20/2019   AST 19 10/20/2019   ALT 17 10/20/2019   PROT 7.9 10/20/2019   ALBUMIN 4.5 10/20/2019   CALCIUM 9.4 10/20/2019   ANIONGAP 7 10/20/2019   Lab Results  Component Value Date   CHOL 144 09/15/2019   Lab Results  Component Value Date   HDL 50 09/15/2019   Lab Results  Component Value Date   LDLCALC 81 09/15/2019   Lab Results  Component Value Date   TRIG 66 09/15/2019   Lab Results  Component Value Date   CHOLHDL 2.9 09/15/2019   No results found for: HGBA1C    Assessment & Plan:    Problem List Items Addressed This Visit      Cardiovascular and Mediastinum   Hypertension - Primary    Continue current meds as directed Follow up in 3 months        Other   Anxiety    Continue current meds as directed Follow up in 3 months      Paresthesia    Referral to Dr Posey Pronto for second opinion Recent cbc, cmp, and lipid normal --- will order thyroid panel with TSH      Relevant Orders   Thyroid Panel With TSH   Ambulatory referral to Neurology   Gait abnormality    Referral to neuro      Relevant Orders   Ambulatory referral to Neurology   Mixed hyperlipidemia    Well controlled.  No changes to medicines.  Continue to work on eating a healthy diet and exercise.           No orders of the defined types were placed in this encounter.   Follow-up: Return in about 3 months (around 02/03/2020) for chronic fasting follow up ---- 30 min!Marland Kitchen    SARA R Berle Fitz, PA-C

## 2019-11-04 ENCOUNTER — Other Ambulatory Visit: Payer: Self-pay | Admitting: Obstetrics and Gynecology

## 2019-11-04 DIAGNOSIS — Z1231 Encounter for screening mammogram for malignant neoplasm of breast: Secondary | ICD-10-CM

## 2019-11-04 LAB — THYROID PANEL WITH TSH
Free Thyroxine Index: 1.7 (ref 1.2–4.9)
T3 Uptake Ratio: 26 % (ref 24–39)
T4, Total: 6.6 ug/dL (ref 4.5–12.0)
TSH: 1.03 u[IU]/mL (ref 0.450–4.500)

## 2019-11-05 ENCOUNTER — Encounter: Payer: Self-pay | Admitting: Neurology

## 2019-11-10 ENCOUNTER — Other Ambulatory Visit: Payer: Self-pay

## 2019-11-10 ENCOUNTER — Encounter: Payer: Self-pay | Admitting: Cardiology

## 2019-11-10 ENCOUNTER — Ambulatory Visit: Payer: 59 | Admitting: Cardiology

## 2019-11-10 VITALS — BP 124/80 | HR 66 | Ht 65.0 in | Wt 219.0 lb

## 2019-11-10 DIAGNOSIS — E782 Mixed hyperlipidemia: Secondary | ICD-10-CM

## 2019-11-10 DIAGNOSIS — R0789 Other chest pain: Secondary | ICD-10-CM

## 2019-11-10 DIAGNOSIS — G4733 Obstructive sleep apnea (adult) (pediatric): Secondary | ICD-10-CM

## 2019-11-10 DIAGNOSIS — R9439 Abnormal result of other cardiovascular function study: Secondary | ICD-10-CM

## 2019-11-10 DIAGNOSIS — F419 Anxiety disorder, unspecified: Secondary | ICD-10-CM

## 2019-11-10 DIAGNOSIS — I10 Essential (primary) hypertension: Secondary | ICD-10-CM

## 2019-11-10 HISTORY — DX: Abnormal result of other cardiovascular function study: R94.39

## 2019-11-10 NOTE — Progress Notes (Signed)
Cardiology Office Note:    Date:  11/10/2019   ID:  [name], DOB 01-Jun-1960, MRN [id number]  PCP:  Marge Duncans, PA-C  Cardiologist:  Jenne Campus, MD    Referring MD: Marge Duncans, PA-C   Chief Complaint  Patient presents with  . Follow-up  Am still not doing well  History of Present Illness:    Yolanda Sanchez is a 59 y.o. female with past medical history significant for essential hypertension, obstructive sleep apnea, atypical chest pain. She did have a stress test done which showed small area of questionable ischemia. We continued discussing about what to do with the situation. Last time I gave her prescription for ranolazine. However, she developed some strange side effect of this medication which include some speech problems some left-sided weakness. She did see neurologist quite extensive evaluation has been done which included brain MRI. All testing is negative. However she still complaining of having difficulty finding words also she is crying in my office. It is a very difficult situation. She is very frustrated. She is looking for second opinion from neurology point of view. Described to have some chest pain which is very atypical never happened with exercise lasting only for a few seconds. She thinks what happened to his combination of ranolazine as well as Lipitor to get it. She read something on the Internet that that can create a problem.  Past Medical History:  Diagnosis Date  . Anxiety   . Depression   . HSV infection    1 & 2   . Hypertension     Past Surgical History:  Procedure Laterality Date  . 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 CHOLECYSTECTOMY    . COLONOSCOPY    . TUBAL LIGATION      Current Medications: Current Meds  Medication Sig  . albuterol (VENTOLIN HFA) 108 (90 Base) MCG/ACT inhaler Inhale 2 puffs into the lungs every 6 (six) hours as needed for wheezing or shortness of breath.  Marland Kitchen  aspirin EC 81 MG tablet Take 1 tablet (81 mg total) by mouth daily. Swallow whole.  Marland Kitchen atorvastatin (LIPITOR) 10 MG tablet Take 1 tablet (10 mg total) by mouth daily.  Marland Kitchen buPROPion (WELLBUTRIN XL) 300 MG 24 hr tablet Take 1 tablet by mouth daily.  . fluticasone (FLONASE) 50 MCG/ACT nasal spray Place 1 spray into both nostrils daily as needed for allergies or rhinitis.  Marland Kitchen lisinopril-hydrochlorothiazide (ZESTORETIC) 10-12.5 MG tablet Take 1 tablet by mouth daily.  . meloxicam (MOBIC) 7.5 MG tablet Take 7.5 mg by mouth daily. As needed.  . montelukast (SINGULAIR) 10 MG tablet Take 1 tablet by mouth daily.  . valACYclovir (VALTREX) 1000 MG tablet Take 1,000 mg by mouth daily.     Allergies:   Patient has no known allergies.   Social History   Socioeconomic History  . Marital status: Divorced    Spouse name: Not on file  . Number of children: Not on file  . Years of education: Not on file  . Highest education level: Not on file  Occupational History  . Not on file  Tobacco Use  . Smoking status: Former Smoker    Quit date: 2019    Years since quitting: 2.6  . Smokeless tobacco: Never Used  . Tobacco comment: on and off for 20 years   Vaping Use  . Vaping Use: Former  Substance and Sexual Activity  . Alcohol use: Yes  Alcohol/week: 1.0 - 2.0 standard drink    Types: 1 - 2 Standard drinks or equivalent per week  . Drug use: Yes    Types: Marijuana    Comment: at night for insomnia sometimes   . Sexual activity: Not on file  Other Topics Concern  . Not on file  Social History Narrative   Lives alone    Right handed   Caffeine: 1 cup/day   Social Determinants of Health   Financial Resource Strain:   . Difficulty of Paying Living Expenses: Not on file  Food Insecurity:   . Worried About Charity fundraiser in the Last Year: Not on file  . Ran Out of Food in the Last Year: Not on file  Transportation Needs:   . Lack of Transportation (Medical): Not on file  . Lack of  Transportation (Non-Medical): Not on file  Physical Activity:   . Days of Exercise per Week: Not on file  . Minutes of Exercise per Session: Not on file  Stress:   . Feeling of Stress : Not on file  Social Connections:   . Frequency of Communication with Friends and Family: Not on file  . Frequency of Social Gatherings with Friends and Family: Not on file  . Attends Religious Services: Not on file  . Active Member of Clubs or Organizations: Not on file  . Attends Archivist Meetings: Not on file  . Marital Status: Not on file     Family History: The patient's family history includes Colon cancer in her paternal grandfather; Dementia in her paternal grandmother; Depression in her mother; Diabetes in her brother, brother, and son; Heart attack in her maternal grandfather and maternal grandmother; Heart disease in her maternal grandmother; Hyperlipidemia in her brother, father, and mother; Hypertension in her brother, brother, and mother; Skin cancer in her father; Sleep apnea in her brother. ROS:   Please see the history of present illness.    All 14 point review of systems negative except as described per history of present illness  EKGs/Labs/Other Studies Reviewed:      Recent Labs: 10/20/2019: ALT 17; BUN 15; Creatinine, Ser 0.89; Hemoglobin 14.7; Magnesium 2.0; Platelets 219; Potassium 3.4; Sodium 135 11/03/2019: TSH 1.030  Recent Lipid Panel    Component Value Date/Time   CHOL 144 09/15/2019 1649   TRIG 66 09/15/2019 1649   HDL 50 09/15/2019 1649   CHOLHDL 2.9 09/15/2019 1649   LDLCALC 81 09/15/2019 1649    Physical Exam:    VS:  BP 124/80 (BP Location: Left Arm, Patient Position: Sitting, Cuff Size: Normal)   Pulse 66   Ht 5\' 5"  (1.651 m)   Wt 219 lb (99.3 kg)   SpO2 98%   BMI 36.44 kg/m     Wt Readings from Last 3 Encounters:  11/10/19 219 lb (99.3 kg)  11/03/19 219 lb (99.3 kg)  10/22/19 221 lb (100.2 kg)     GEN:  Well nourished, well developed in  no acute distress HEENT: Normal NECK: No JVD; No carotid bruits LYMPHATICS: No lymphadenopathy CARDIAC: RRR, no murmurs, no rubs, no gallops RESPIRATORY:  Clear to auscultation without rales, wheezing or rhonchi  ABDOMEN: Soft, non-tender, non-distended MUSCULOSKELETAL:  No edema; No deformity  SKIN: Warm and dry LOWER EXTREMITIES: no swelling NEUROLOGIC:  Alert and oriented x 3 PSYCHIATRIC:  Normal affect   ASSESSMENT:    1. Atypical chest pain   2. Obstructive sleep apnea   3. Essential hypertension   4. Mixed  hyperlipidemia   5. Anxiety   6. Abnormal stress test    PLAN:    In order of problems listed above:  1. Atypical chest pain. We talked about options for this situation option being cardiac catheterization versus coronary CT angio. She does not want to do anything about this at this stage. I will not initiate any additional medications since pain is very atypical and rare occurrences. 2. Obstructive sleep apnea apparently there is some hold off with insurance company who does not want to approve sleep study. We will try to investigate that issue. 3. External essential hypertension her blood pressures well controlled today. 124/80 which I will continue. 4. Mixed dyslipidemia she is convinced that Lipitor and ranolazine combination created problem. Asked her to stop Lipitor for about a week or 2 and see if her symptomatology improves. However, honestly, I doubt very much that that is an issue. 5. Anxiety in my opinion play significant role here and try to gently tell her that I suspect this is in part of the problem. I think she can benefit from psychological support for this very complex situation. She is crying in my office I think there is also some component of depression, she does not have any suicidal homicidal it idealization. Again my opinion psychological support will be very helpful here however she is not open up with that suggestion yet. 6. Abnormal stress test only  very mild abnormality with very atypical symptomatology. We will continue conservative approach.  Overall very complicated and sad situation she is very frustrated 10 disappointed. She said she had difficulty finding words, she said her thinking is not clear. She complained of having some numbness on the left side of her face left side of her body. She said she got difficulty walking. So far quite extensive evaluation done by neurology was unrevealing. She is looking for second opinion and I support that.   Medication Adjustments/Labs and Tests Ordered: Current medicines are reviewed at length with the patient today.  Concerns regarding medicines are outlined above.  No orders of the defined types were placed in this encounter.  Medication changes: No orders of the defined types were placed in this encounter.   Signed, Park Liter, MD, [facility] 11/10/2019 9:16 AM    [place]

## 2019-11-10 NOTE — Patient Instructions (Addendum)
Medication Instructions:  You can try to stop lipitor for a week or two to see if it helps with symptoms.   *If you need a refill on your cardiac medications before your next appointment, please call your pharmacy*   Lab Work: Non.  If you have labs (blood work) drawn today and your tests are completely normal, you will receive your results only by: Marland Kitchen MyChart Message (if you have MyChart) OR . A paper copy in the mail If you have any lab test that is abnormal or we need to change your treatment, we will call you to review the results.   Testing/Procedures: None.    Follow-Up: At [facility], you and your health needs are our priority.  As part of our continuing mission to provide you with exceptional heart care, we have created designated Provider Care Teams.  These Care Teams include your primary Cardiologist (physician) and Advanced Practice Providers (APPs -  Physician Assistants and Nurse Practitioners) who all work together to provide you with the care you need, when you need it.  We recommend signing up for the patient portal called "MyChart".  Sign up information is provided on this After Visit Summary.  MyChart is used to connect with patients for Virtual Visits (Telemedicine).  Patients are able to view lab/test results, encounter notes, upcoming appointments, etc.  Non-urgent messages can be sent to your provider as well.   To learn more about what you can do with MyChart, go to [email or web address].    Your next appointment:   3 month(s)  The format for your next appointment:   In Person  Provider:   Jenne Campus, MD   Other Instructions

## 2019-11-11 ENCOUNTER — Telehealth: Payer: Self-pay | Admitting: *Deleted

## 2019-11-11 NOTE — Telephone Encounter (Signed)
Patient never received her APAP that was ordered through [facility] last year. She wanted to wait and try to lose weight first. Resubmitted request to [facility].

## 2019-11-12 ENCOUNTER — Telehealth: Payer: Self-pay | Admitting: *Deleted

## 2019-11-12 NOTE — Telephone Encounter (Signed)
APAP orders sent to [facility].

## 2019-11-17 ENCOUNTER — Ambulatory Visit
Admission: RE | Admit: 2019-11-17 | Discharge: 2019-11-17 | Disposition: A | Discharge status: Home / Self Care | Payer: 59 | Source: Ambulatory Visit | Attending: Obstetrics and Gynecology | Admitting: Obstetrics and Gynecology

## 2019-11-17 ENCOUNTER — Other Ambulatory Visit: Payer: Self-pay

## 2019-11-17 DIAGNOSIS — Z1231 Encounter for screening mammogram for malignant neoplasm of breast: Secondary | ICD-10-CM

## 2019-11-19 ENCOUNTER — Telehealth: Payer: Self-pay | Admitting: Cardiology

## 2019-11-19 NOTE — Telephone Encounter (Signed)
Follow up:      Patient need CPAP please call patient.

## 2019-11-20 ENCOUNTER — Other Ambulatory Visit: Payer: Self-pay | Admitting: Physician Assistant

## 2019-11-20 ENCOUNTER — Other Ambulatory Visit: Payer: Self-pay | Admitting: Obstetrics and Gynecology

## 2019-11-20 DIAGNOSIS — R928 Other abnormal and inconclusive findings on diagnostic imaging of breast: Secondary | ICD-10-CM

## 2019-11-20 MED ORDER — VALACYCLOVIR HCL 1 G PO TABS: 1000.0000 mg | ORAL_TABLET | Freq: Every day | ORAL | 5 refills | Status: DC

## 2019-11-25 DIAGNOSIS — B009 Herpesviral infection, unspecified: Secondary | ICD-10-CM | POA: Insufficient documentation

## 2019-11-25 DIAGNOSIS — F32A Depression, unspecified: Secondary | ICD-10-CM | POA: Insufficient documentation

## 2019-11-26 ENCOUNTER — Encounter: Payer: Self-pay | Admitting: Cardiology

## 2019-11-26 ENCOUNTER — Ambulatory Visit: Payer: 59 | Admitting: Cardiology

## 2019-11-26 ENCOUNTER — Other Ambulatory Visit: Payer: Self-pay

## 2019-11-26 ENCOUNTER — Ambulatory Visit
Admission: RE | Admit: 2019-11-26 | Discharge: 2019-11-26 | Disposition: A | Discharge status: Home / Self Care | Payer: 59 | Source: Ambulatory Visit | Attending: Obstetrics and Gynecology | Admitting: Obstetrics and Gynecology

## 2019-11-26 VITALS — BP 144/96 | HR 96 | Ht 65.0 in | Wt 212.6 lb

## 2019-11-26 DIAGNOSIS — R9439 Abnormal result of other cardiovascular function study: Secondary | ICD-10-CM

## 2019-11-26 DIAGNOSIS — R0789 Other chest pain: Secondary | ICD-10-CM

## 2019-11-26 DIAGNOSIS — I10 Essential (primary) hypertension: Secondary | ICD-10-CM | POA: Diagnosis not present

## 2019-11-26 DIAGNOSIS — E782 Mixed hyperlipidemia: Secondary | ICD-10-CM | POA: Diagnosis not present

## 2019-11-26 DIAGNOSIS — R928 Other abnormal and inconclusive findings on diagnostic imaging of breast: Secondary | ICD-10-CM

## 2019-11-26 DIAGNOSIS — R072 Precordial pain: Secondary | ICD-10-CM

## 2019-11-26 HISTORY — DX: Other chest pain: R07.89

## 2019-11-26 MED ORDER — NITROGLYCERIN 0.4 MG SL SUBL
0.4000 mg | SUBLINGUAL_TABLET | SUBLINGUAL | 6 refills | Status: DC | PRN
Start: 2019-11-26 — End: 2020-08-12

## 2019-11-26 MED ORDER — METOPROLOL TARTRATE 100 MG PO TABS
100.0000 mg | ORAL_TABLET | Freq: Once | ORAL | 0 refills | Status: DC
Start: 2019-11-26 — End: 2020-01-29

## 2019-11-26 NOTE — Progress Notes (Signed)
Cardiology Office Note:    Date:  11/26/2019   ID:  Yolanda Sanchez, DOB 1961-02-08, MRN [id number]  PCP:  Marge Duncans, PA-C  Cardiologist:  Jenean Lindau, MD   Referring MD: Marge Duncans, PA-C    ASSESSMENT:    1. Essential hypertension   2. Mixed hyperlipidemia   3. Abnormal stress test   4. Chest tightness    PLAN:    In order of problems listed above:  1. I discussed my findings with the patient at extensive length.  I reviewed her records she has multiple risk factors for coronary artery disease she has had an abnormal stress test I gave her evaluation options with CT coronary angiography with FFR and conventional coronary angiography.  Benefits and potential is explained to the patient and she vocalized understanding.  She prefers CT coronary angiography with FFR and we will schedule her for the same.  She is taking a coated baby aspirin on a regular basis.  Sublingual nitroglycerin prescription was sent, its protocol and 911 protocol explained and the patient vocalized understanding questions were answered to the patient's satisfaction.  She knows to go to the nearest emergency room for any concerning symptoms. 2. Essential hypertension: Blood pressure is stable 3. Mixed dyslipidemia: Diet was emphasized.  Lipids followed by primary care physician.  [facility] notes revealed that lipids are fine at this time. 4. As mentioned below she has had some issues with Ranexa and she tells me that she will discuss this specifically with my partner cardiologist Dr. Agustin Cree, her primary care physician and her neurologist.  She wanted to focus mostly today on the cardiovascular issues at hand.  Patient had multiple questions which were answered to her satisfaction.   Medication Adjustments/Labs and Tests Ordered: Current medicines are reviewed at length with the patient today.  Concerns regarding medicines are outlined above.  No orders of the defined types were placed in this  encounter.  No orders of the defined types were placed in this encounter.    No chief complaint on file.    History of Present Illness:    Yolanda Sanchez is a 59 y.o. female.  Patient has past medical history of essential hypertension and dyslipidemia.  She had an abnormal stress test.  She was prescribed Ranexa and she says that she had an adverse event..  She has discussed it with Dr. Agustin Cree my partner who is her cardiologist.  She has seen neurology and primary care for follow-up of these issues.  She mentions to me that this is not what she wants to discuss with me today.  She is here to precisely focus on abnormal nuclear stress test and her options for further evaluation.  At the time of my evaluation she is alert awake oriented and in no distress.  She occasionally has chest tightness.  She does not exercise on a regular basis.  Past Medical History:  Diagnosis Date  . Abnormal stress test 11/10/2019  . Anxiety   . Atypical chest pain 09/15/2019  . Depression   . Gait abnormality 11/03/2019  . HSV infection    1 & 2   . Hypercholesterolemia 09/11/2018  . Hypertension   . Left facial numbness 10/25/2019  . Mixed hyperlipidemia 11/03/2019  . Obstructive sleep apnea 09/11/2018  . Palpitations 09/15/2019  . Paresthesia 11/03/2019  . Swelling of both lower extremities 09/11/2018    Past Surgical History:  Procedure Laterality Date  . BREAST BIOPSY Left   . CHOLECYSTECTOMY    .  COLONOSCOPY    . TUBAL LIGATION      Current Medications: Current Meds  Medication Sig  . albuterol (VENTOLIN HFA) 108 (90 Base) MCG/ACT inhaler Inhale 2 puffs into the lungs every 6 (six) hours as needed for wheezing or shortness of breath.  Marland Kitchen aspirin EC 81 MG tablet Take 1 tablet (81 mg total) by mouth daily. Swallow whole.  Marland Kitchen buPROPion (WELLBUTRIN XL) 300 MG 24 hr tablet Take 300 mg by mouth daily.  . fluticasone (FLONASE) 50 MCG/ACT nasal spray Place 1 spray into both nostrils daily as needed for  allergies or rhinitis.  Marland Kitchen lisinopril-hydrochlorothiazide (ZESTORETIC) 10-12.5 MG tablet Take 1 tablet by mouth daily.  . meloxicam (MOBIC) 7.5 MG tablet Take 7.5 mg by mouth daily. As needed.  . montelukast (SINGULAIR) 10 MG tablet Take 1 tablet by mouth daily.  . valACYclovir (VALTREX) 1000 MG tablet Take 1 tablet (1,000 mg total) by mouth daily.     Allergies:   Ranexa [ranolazine]   Social History   Socioeconomic History  . Marital status: Divorced    Spouse name: Not on file  . Number of children: Not on file  . Years of education: Not on file  . Highest education level: Not on file  Occupational History  . Not on file  Tobacco Use  . Smoking status: Former Smoker    Quit date: 2019    Years since quitting: 2.7  . Smokeless tobacco: Never Used  . Tobacco comment: on and off for 20 years   Vaping Use  . Vaping Use: Former  Substance and Sexual Activity  . Alcohol use: Yes    Alcohol/week: 1.0 - 2.0 standard drink    Types: 1 - 2 Standard drinks or equivalent per week  . Drug use: Yes    Types: Marijuana    Comment: at night for insomnia sometimes   . Sexual activity: Not on file  Other Topics Concern  . Not on file  Social History Narrative   Lives alone    Right handed   Caffeine: 1 cup/day   Social Determinants of Health   Financial Resource Strain:   . Difficulty of Paying Living Expenses: Not on file  Food Insecurity:   . Worried About Charity fundraiser in the Last Year: Not on file  . Ran Out of Food in the Last Year: Not on file  Transportation Needs:   . Lack of Transportation (Medical): Not on file  . Lack of Transportation (Non-Medical): Not on file  Physical Activity:   . Days of Exercise per Week: Not on file  . Minutes of Exercise per Session: Not on file  Stress:   . Feeling of Stress : Not on file  Social Connections:   . Frequency of Communication with Friends and Family: Not on file  . Frequency of Social Gatherings with Friends and  Family: Not on file  . Attends Religious Services: Not on file  . Active Member of Clubs or Organizations: Not on file  . Attends Archivist Meetings: Not on file  . Marital Status: Not on file     Family History: The patient's family history includes Colon cancer in her paternal grandfather; Dementia in her paternal grandmother; Depression in her mother; Diabetes in her brother, brother, and son; Heart attack in her maternal grandfather and maternal grandmother; Heart disease in her maternal grandmother; Hyperlipidemia in her brother, father, and mother; Hypertension in her brother, brother, and mother; Skin cancer in her father;  Sleep apnea in her brother.  ROS:   Please see the history of present illness.    All other systems reviewed and are negative.  EKGs/Labs/Other Studies Reviewed:    The following studies were reviewed today: Study Highlights   Nuclear stress EF: 70%.  The left ventricular ejection fraction is hyperdynamic (>65%).  No T wave inversion was noted during stress.  There was no ST segment deviation noted during stress.  Defect 1: There is a small defect of mild severity present in the mid anteroseptal location.  Findings consistent with ischemia.  This is a low risk study.      Recent Labs: 10/20/2019: ALT 17; BUN 15; Creatinine, Ser 0.89; Hemoglobin 14.7; Magnesium 2.0; Platelets 219; Potassium 3.4; Sodium 135 11/03/2019: TSH 1.030  Recent Lipid Panel    Component Value Date/Time   CHOL 144 09/15/2019 1649   TRIG 66 09/15/2019 1649   HDL 50 09/15/2019 1649   CHOLHDL 2.9 09/15/2019 1649   LDLCALC 81 09/15/2019 1649    Physical Exam:    VS:  BP (!) 144/96   Pulse 96   Ht 5\' 5"  (1.651 m)   Wt 212 lb 9.6 oz (96.4 kg)   SpO2 98%   BMI 35.38 kg/m     Wt Readings from Last 3 Encounters:  11/26/19 212 lb 9.6 oz (96.4 kg)  11/10/19 219 lb (99.3 kg)  11/03/19 219 lb (99.3 kg)     GEN: Patient is in no acute distress HEENT:  Normal NECK: No JVD; No carotid bruits LYMPHATICS: No lymphadenopathy CARDIAC: Hear sounds regular, 2/6 systolic murmur at the apex. RESPIRATORY:  Clear to auscultation without rales, wheezing or rhonchi  ABDOMEN: Soft, non-tender, non-distended MUSCULOSKELETAL:  No edema; No deformity  SKIN: Warm and dry NEUROLOGIC:  Alert and oriented x 3 PSYCHIATRIC:  Normal affect   Signed, Jenean Lindau, MD  11/26/2019 1:32 PM    [facility]

## 2019-11-26 NOTE — Patient Instructions (Signed)
Medication Instructions:  Your physician has recommended you make the following change in your medication:   Take Nitroglycerin as needed for chest pain.  *If you need a refill on your cardiac medications before your next appointment, please call your pharmacy*   Lab Work: Your physician recommends that you return for lab work in: 1 week prior to your Cardiac CT for a BMET.  If you have labs (blood work) drawn today and your tests are completely normal, you will receive your results only by: Marland Kitchen MyChart Message (if you have MyChart) OR . A paper copy in the mail If you have any lab test that is abnormal or we need to change your treatment, we will call you to review the results.   Testing/Procedures: Your cardiac CT will be scheduled at:   [facility] [address] [place], [place] [address] [phone]  [facility], please arrive at the [facility] main entrance of [facility] 30 minutes prior to test start time. Proceed to the [facility] Radiology Department (first floor) to check-in and test prep.   Please follow these instructions carefully (unless otherwise directed):  On the Night Before the Test: . Be sure to Drink plenty of water. . Do not consume any caffeinated/decaffeinated beverages or chocolate 12 hours prior to your test. . Do not take any antihistamines 12 hours prior to your test.   On the Day of the Test: . Drink plenty of water. Do not drink any water within one hour of the test. . Do not eat any food 4 hours prior to the test. . You may take your regular medications prior to the test.  . Take metoprolol (Lopressor) two hours prior to test. . FEMALES- please wear underwire-free bra if available      After the Test: . Drink plenty of water. . After receiving IV contrast, you may experience a mild flushed feeling. This is normal. . On occasion, you may experience a mild rash up to 24 hours after the test. This is not  dangerous. If this occurs, you can take Benadryl 25 mg and increase your fluid intake. . If you experience trouble breathing, this can be serious. If it is severe call 911 IMMEDIATELY. If it is mild, please call our office. . If you take any of these medications: Glipizide/Metformin, Avandament, Glucavance, please do not take 48 hours after completing test unless otherwise instructed.   Once we have confirmed authorization from your insurance company, we will call you to set up a date and time for your test. Based on how quickly your insurance processes prior authorizations requests, please allow up to 4 weeks to be contacted for scheduling your Cardiac CT appointment. Be advised that routine Cardiac CT appointments could be scheduled as many as 8 weeks after your provider has ordered it.  For non-scheduling related questions, please contact the cardiac imaging nurse navigator should you have any questions/concerns: Marchia Bond, Cardiac Imaging Nurse Navigator Burley Saver, Interim Cardiac Imaging Nurse [place] [facility] Direct Office Dial: [phone]   For scheduling needs, including cancellations and rescheduling, please call Vivien Rota at [phone], option 3.      Follow-Up: At [facility], you and your health needs are our priority.  As part of our continuing mission to provide you with exceptional heart care, we have created designated Provider Care Teams.  These Care Teams include your primary Cardiologist (physician) and Advanced Practice Providers (APPs -  Physician Assistants and  Nurse Practitioners) who all work together to provide you with the care you need, when you need it.  We recommend signing up for the patient portal called "MyChart".  Sign up information is provided on this After Visit Summary.  MyChart is used to connect with patients for Virtual Visits (Telemedicine).  Patients are able to view lab/test results, encounter notes, upcoming  appointments, etc.  Non-urgent messages can be sent to your provider as well.   To learn more about what you can do with MyChart, go to [email or web address].    Your next appointment:   4-6  week(s)  The format for your next appointment:   In Person  Provider:   Jenne Campus, MD   Other Instructions Nitroglycerin sublingual tablets What is this medicine? NITROGLYCERIN (nye troe GLI ser in) is a type of vasodilator. It relaxes blood vessels, increasing the blood and oxygen supply to your heart. This medicine is used to relieve chest pain caused by angina. It is also used to prevent chest pain before activities like climbing stairs, going outdoors in cold weather, or sexual activity. This medicine may be used for other purposes; ask your health care provider or pharmacist if you have questions. COMMON BRAND NAME(S): Nitroquick, Nitrostat, Nitrotab What should I tell my health care provider before I take this medicine? They need to know if you have any of these conditions:  anemia  head injury, recent stroke, or bleeding in the brain  liver disease  previous heart attack  an unusual or allergic reaction to nitroglycerin, other medicines, foods, dyes, or preservatives  pregnant or trying to get pregnant  breast-feeding How should I use this medicine? Take this medicine by mouth as needed. At the first sign of an angina attack (chest pain or tightness) place one tablet under your tongue. You can also take this medicine 5 to 10 minutes before an event likely to produce chest pain. Follow the directions on the prescription label. Let the tablet dissolve under the tongue. Do not swallow whole. Replace the dose if you accidentally swallow it. It will help if your mouth is not dry. Saliva around the tablet will help it to dissolve more quickly. Do not eat or drink, smoke or chew tobacco while a tablet is dissolving. If you are not better within 5 minutes after taking ONE dose of  nitroglycerin, call 9-1-1 immediately to seek emergency medical care. Do not take more than 3 nitroglycerin tablets over 15 minutes. If you take this medicine often to relieve symptoms of angina, your doctor or health care professional may provide you with different instructions to manage your symptoms. If symptoms do not go away after following these instructions, it is important to call 9-1-1 immediately. Do not take more than 3 nitroglycerin tablets over 15 minutes. Talk to your pediatrician regarding the use of this medicine in children. Special care may be needed. Overdosage: If you think you have taken too much of this medicine contact a poison control center or emergency room at once. NOTE: This medicine is only for you. Do not share this medicine with others. What if I miss a dose? This does not apply. This medicine is only used as needed. What may interact with this medicine? Do not take this medicine with any of the following medications:  certain migraine medicines like ergotamine and dihydroergotamine (DHE)  medicines used to treat erectile dysfunction like sildenafil, tadalafil, and vardenafil  riociguat This medicine may also interact with the following medications:  alteplase  aspirin  heparin  medicines for high blood pressure  medicines for mental depression  other medicines used to treat angina  phenothiazines like chlorpromazine, mesoridazine, prochlorperazine, thioridazine This list may not describe all possible interactions. Give your health care provider a list of all the medicines, herbs, non-prescription drugs, or dietary supplements you use. Also tell them if you smoke, drink alcohol, or use illegal drugs. Some items may interact with your medicine. What should I watch for while using this medicine? Tell your doctor or health care professional if you feel your medicine is no longer working. Keep this medicine with you at all times. Sit or lie down when you  take your medicine to prevent falling if you feel dizzy or faint after using it. Try to remain calm. This will help you to feel better faster. If you feel dizzy, take several deep breaths and lie down with your feet propped up, or bend forward with your head resting between your knees. You may get drowsy or dizzy. Do not drive, use machinery, or do anything that needs mental alertness until you know how this drug affects you. Do not stand or sit up quickly, especially if you are an older patient. This reduces the risk of dizzy or fainting spells. Alcohol can make you more drowsy and dizzy. Avoid alcoholic drinks. Do not treat yourself for coughs, colds, or pain while you are taking this medicine without asking your doctor or health care professional for advice. Some ingredients may increase your blood pressure. What side effects may I notice from receiving this medicine? Side effects that you should report to your doctor or health care professional as soon as possible:  blurred vision  dry mouth  skin rash  sweating  the feeling of extreme pressure in the head  unusually weak or tired Side effects that usually do not require medical attention (report to your doctor or health care professional if they continue or are bothersome):  flushing of the face or neck  headache  irregular heartbeat, palpitations  nausea, vomiting This list may not describe all possible side effects. Call your doctor for medical advice about side effects. You may report side effects to FDA at 1-800-FDA-1088. Where should I keep my medicine? Keep out of the reach of children. Store at room temperature between 20 and 25 degrees C (68 and 77 degrees F). Store in [facility] [employer]. Protect from light and moisture. Keep tightly closed. Throw away any unused medicine after the expiration date. NOTE: This sheet is a summary. It may not cover all possible information. If you have questions about this medicine, talk to  your doctor, pharmacist, or health care provider.  2020 Elsevier/Gold Standard (2012-12-25 17:57:36)  Cardiac CT Angiogram A cardiac CT angiogram is a procedure to look at the heart and the area around the heart. It may be done to help find the cause of chest pains or other symptoms of heart disease. During this procedure, a substance called contrast dye is injected into the blood vessels in the area to be checked. A large X-ray machine, called a CT scanner, then takes detailed pictures of the heart and the surrounding area. The procedure is also sometimes called a coronary CT angiogram, coronary artery scanning, or CTA. A cardiac CT angiogram allows the health care provider to see how well blood is flowing to and from the heart. The health care provider will be able to see if there are any problems, such as:  Blockage or narrowing of the coronary arteries in  the heart.  Fluid around the heart.  Signs of weakness or disease in the muscles, valves, and tissues of the heart. Tell a health care provider about:  Any allergies you have. This is especially important if you have had a previous allergic reaction to contrast dye.  All medicines you are taking, including vitamins, herbs, eye drops, creams, and over-the-counter medicines.  Any blood disorders you have.  Any surgeries you have had.  Any medical conditions you have.  Whether you are pregnant or may be pregnant.  Any anxiety disorders, chronic pain, or other conditions you have that may increase your stress or prevent you from lying still. What are the risks? Generally, this is a safe procedure. However, problems may occur, including:  Bleeding.  Infection.  Allergic reactions to medicines or dyes.  Damage to other structures or organs.  Kidney damage from the contrast dye that is used.  Increased risk of cancer from radiation exposure. This risk is low. Talk with your health care provider about: ? The risks and benefits  of testing. ? How you can receive the lowest dose of radiation. What happens before the procedure?  Wear comfortable clothing and remove any jewelry, glasses, dentures, and hearing aids.  Follow instructions from your health care provider about eating and drinking. This may include: ? For 12 hours before the procedure -- avoid caffeine. This includes tea, coffee, soda, energy drinks, and diet pills. Drink plenty of water or other fluids that do not have caffeine in them. Being well hydrated can prevent complications. ? For 4-6 hours before the procedure -- stop eating and drinking. The contrast dye can cause nausea, but this is less likely if your stomach is empty.  Ask your health care provider about changing or stopping your regular medicines. This is especially important if you are taking diabetes medicines, blood thinners, or medicines to treat problems with erections (erectile dysfunction). What happens during the procedure?   Hair on your chest may need to be removed so that small sticky patches called electrodes can be placed on your chest. These will transmit information that helps to monitor your heart during the procedure.  An IV will be inserted into one of your veins.  You might be given a medicine to control your heart rate during the procedure. This will help to ensure that good images are obtained.  You will be asked to lie on an exam table. This table will slide in and out of the CT machine during the procedure.  Contrast dye will be injected into the IV. You might feel warm, or you may get a metallic taste in your mouth.  You will be given a medicine called nitroglycerin. This will relax or dilate the arteries in your heart.  The table that you are lying on will move into the CT machine tunnel for the scan.  The person running the machine will give you instructions while the scans are being done. You may be asked to: ? Keep your arms above your head. ? Hold your  breath. ? Stay very still, even if the table is moving.  When the scanning is complete, you will be moved out of the machine.  The IV will be removed. The procedure may vary among health care providers and hospitals. What can I expect after the procedure? After your procedure, it is common to have:  A metallic taste in your mouth from the contrast dye.  A feeling of warmth.  A headache from the nitroglycerin.  Follow these instructions at home:  Take over-the-counter and prescription medicines only as told by your health care provider.  If you are told, drink enough fluid to keep your urine pale yellow. This will help to flush the contrast dye out of your body.  Most people can return to their normal activities right after the procedure. Ask your health care provider what activities are safe for you.  It is up to you to get the results of your procedure. Ask your health care provider, or the department that is doing the procedure, when your results will be ready.  Keep all follow-up visits as told by your health care provider. This is important. Contact a health care provider if:  You have any symptoms of allergy to the contrast dye. These include: ? Shortness of breath. ? Rash or hives. ? A racing heartbeat. Summary  A cardiac CT angiogram is a procedure to look at the heart and the area around the heart. It may be done to help find the cause of chest pains or other symptoms of heart disease.  During this procedure, a large X-ray machine, called a CT scanner, takes detailed pictures of the heart and the surrounding area after a contrast dye has been injected into blood vessels in the area.  Ask your health care provider about changing or stopping your regular medicines before the procedure. This is especially important if you are taking diabetes medicines, blood thinners, or medicines to treat erectile dysfunction.  If you are told, drink enough fluid to keep your urine pale  yellow. This will help to flush the contrast dye out of your body. This information is not intended to replace advice given to you by your health care provider. Make sure you discuss any questions you have with your health care provider. Document Revised: 10/22/2018 Document Reviewed: 10/22/2018 Elsevier Patient Education  [employer].

## 2019-12-08 LAB — BASIC METABOLIC PANEL
BUN/Creatinine Ratio: 17 (ref 9–23)
BUN: 16 mg/dL (ref 6–24)
CO2: 25 mmol/L (ref 20–29)
Calcium: 9.4 mg/dL (ref 8.7–10.2)
Chloride: 100 mmol/L (ref 96–106)
Creatinine, Ser: 0.93 mg/dL (ref 0.57–1.00)
GFR calc Af Amer: 79 mL/min/{1.73_m2} (ref >59)
GFR calc non Af Amer: 68 mL/min/{1.73_m2} (ref >59)
Glucose: 84 mg/dL (ref 65–99)
Potassium: 3.8 mmol/L (ref 3.5–5.2)
Sodium: 141 mmol/L (ref 134–144)

## 2019-12-10 ENCOUNTER — Telehealth (HOSPITAL_COMMUNITY): Payer: Self-pay | Admitting: Emergency Medicine

## 2019-12-10 NOTE — Telephone Encounter (Signed)
Reaching out to patient to offer assistance regarding upcoming cardiac imaging study; pt verbalizes understanding of appt date/time, parking situation and where to check in, pre-test NPO status and medications ordered, and verified current allergies; name and call back number provided for further questions should they arise Marchia Bond RN [facility] [phone] office [phone] cell   Pt to hold zestoric, taking metoprolol 2 hr prior to scan

## 2019-12-11 ENCOUNTER — Other Ambulatory Visit: Payer: Self-pay

## 2019-12-11 ENCOUNTER — Ambulatory Visit (HOSPITAL_COMMUNITY)
Admission: RE | Admit: 2019-12-11 | Discharge: 2019-12-11 | Disposition: A | Discharge status: Home / Self Care | Payer: 59 | Source: Ambulatory Visit | Attending: Cardiology | Admitting: Cardiology

## 2019-12-11 DIAGNOSIS — R0789 Other chest pain: Secondary | ICD-10-CM | POA: Insufficient documentation

## 2019-12-11 DIAGNOSIS — R072 Precordial pain: Secondary | ICD-10-CM | POA: Insufficient documentation

## 2019-12-11 DIAGNOSIS — R9439 Abnormal result of other cardiovascular function study: Secondary | ICD-10-CM | POA: Insufficient documentation

## 2019-12-11 MED ORDER — NITROGLYCERIN 0.4 MG SL SUBL
0.8000 mg | SUBLINGUAL_TABLET | Freq: Once | SUBLINGUAL | Status: AC
  Administered 2019-12-11: 0.8 mg via SUBLINGUAL

## 2019-12-11 MED ORDER — IOHEXOL 350 MG/ML SOLN
80.0000 mL | Freq: Once | INTRAVENOUS | Status: AC | PRN
  Administered 2019-12-11: 80 mL via INTRAVENOUS

## 2019-12-11 MED ORDER — NITROGLYCERIN 0.4 MG SL SUBL
SUBLINGUAL_TABLET | SUBLINGUAL | Status: AC
  Filled 2019-12-11: qty 2

## 2019-12-18 ENCOUNTER — Ambulatory Visit: Payer: 59 | Admitting: Cardiology

## 2020-01-12 ENCOUNTER — Ambulatory Visit: Payer: 59 | Admitting: Cardiology

## 2020-01-21 ENCOUNTER — Ambulatory Visit: Payer: 59 | Admitting: Neurology

## 2020-01-29 ENCOUNTER — Other Ambulatory Visit: Payer: Self-pay

## 2020-01-29 ENCOUNTER — Encounter: Payer: Self-pay | Admitting: Neurology

## 2020-01-29 ENCOUNTER — Other Ambulatory Visit (INDEPENDENT_AMBULATORY_CARE_PROVIDER_SITE_OTHER): Payer: 59

## 2020-01-29 ENCOUNTER — Ambulatory Visit (INDEPENDENT_AMBULATORY_CARE_PROVIDER_SITE_OTHER): Payer: 59 | Admitting: Neurology

## 2020-01-29 VITALS — BP 155/88 | HR 71 | Ht 65.0 in | Wt 203.0 lb

## 2020-01-29 DIAGNOSIS — R200 Anesthesia of skin: Secondary | ICD-10-CM

## 2020-01-29 DIAGNOSIS — R202 Paresthesia of skin: Secondary | ICD-10-CM

## 2020-01-29 DIAGNOSIS — R29898 Other symptoms and signs involving the musculoskeletal system: Secondary | ICD-10-CM

## 2020-01-29 DIAGNOSIS — R6889 Other general symptoms and signs: Secondary | ICD-10-CM | POA: Diagnosis not present

## 2020-01-29 LAB — B12 AND FOLATE PANEL
Folate: >23.6 ng/mL (ref >5.9)
Vitamin B-12: 568 pg/mL (ref 211–911)

## 2020-01-29 NOTE — Patient Instructions (Signed)
Check labs.Results will be available on MyChart.

## 2020-01-29 NOTE — Progress Notes (Signed)
[facility] Clinic Note - Initial Visit   Date: 01/29/20  Yolanda Sanchez MRN: [id number] DOB: 11/10/1960   Dear Marge Duncans, PA-C:  Thank you for your kind referral of Yolanda Sanchez for consultation of left side numbness. Although her history is well known to you, please allow [place] to reiterate it for the purpose of our medical record. The patient was accompanied to the clinic by self.   History of Present Illness: Yolanda Sanchez is a 59 y.o. right-handed female with atypical chest pain, hyperlipidemia, anxiety, hypertension, and OSA presenting for evaluation of multitude of complaints.  She feels that all of her symptoms started after taking Ranexa which was prescribed for atypical chest pain in August.  She went to the ER with left cheek numbness, which slowly extended into the left neck. Constellation of other symptoms including blurred vision, eye twitching, difficulty swallowing, facial weakness, gait difficulty, stuttering, and slurred of speech.  She was evaluated by Dr. Jaynee Eagles at [facility] and had MRI brain and cervical spine which was normal. She has since stopped Ranexa and Lipitor and feels that some of her symptoms have improved, but her face remains numb and she continues to have discomfort on the left side.  She keeps talking at length about how to file a complaint with FDA about the medication and the side effects it has left her with. She is very frustrated that no one can help her.   Out-side paper records, electronic medical record, and images have been reviewed where available and summarized as:  MRI brain wwo contrast 10/25/2019: Unremarkable MRI scan of the brain with and without contrast  MRI cervical spine 10/30/2019:  MRI cervical spine (without) demonstrating: - At C3-4: disc bulging and uncovertebral joint hypertrophy and facet hypertrophy with severe left foraminal stenosis. - At C4-5: disc bulging and uncovertebral joint  hypertrophy and facet hypertrophy with mild left foraminal stenosis.  Lab Results  Component Value Date   TSH 1.030 11/03/2019   Lab Results  Component Value Date   ESRSEDRATE 14 10/20/2019    Past Medical History:  Diagnosis Date  . Abnormal stress test 11/10/2019  . Anxiety   . Atypical chest pain 09/15/2019  . Depression   . Gait abnormality 11/03/2019  . HSV infection    1 & 2   . Hypercholesterolemia 09/11/2018  . Hypertension   . Left facial numbness 10/25/2019  . Mixed hyperlipidemia 11/03/2019  . Obstructive sleep apnea 09/11/2018  . Palpitations 09/15/2019  . Paresthesia 11/03/2019  . Swelling of both lower extremities 09/11/2018    Past Surgical History:  Procedure Laterality Date  . BREAST BIOPSY Left   . CHOLECYSTECTOMY    . COLONOSCOPY    . TUBAL LIGATION       Medications:  Outpatient Encounter Medications as of 01/29/2020  Medication Sig  . albuterol (VENTOLIN HFA) 108 (90 Base) MCG/ACT inhaler Inhale 2 puffs into the lungs every 6 (six) hours as needed for wheezing or shortness of breath.  Marland Kitchen aspirin EC 81 MG tablet Take 1 tablet (81 mg total) by mouth daily. Swallow whole.  Marland Kitchen buPROPion (WELLBUTRIN XL) 300 MG 24 hr tablet Take 300 mg by mouth daily.  . fluticasone (FLONASE) 50 MCG/ACT nasal spray Place 1 spray into both nostrils daily as needed for allergies or rhinitis.  . meloxicam (MOBIC) 7.5 MG tablet Take 7.5 mg by mouth daily. As needed.  . nitroGLYCERIN (NITROSTAT) 0.4 MG SL tablet Place 1 tablet (0.4 mg total) under  the tongue every 5 (five) minutes as needed.  . valACYclovir (VALTREX) 1000 MG tablet Take 1 tablet (1,000 mg total) by mouth daily.  Marland Kitchen lisinopril-hydrochlorothiazide (ZESTORETIC) 10-12.5 MG tablet Take 1 tablet by mouth daily. (Patient not taking: Reported on 01/29/2020)  . montelukast (SINGULAIR) 10 MG tablet Take 1 tablet by mouth daily. (Patient not taking: Reported on 01/29/2020)  . [DISCONTINUED] metoprolol tartrate (LOPRESSOR) 100 MG  tablet Take 1 tablet (100 mg total) by mouth once for 1 dose. Take 2 hours prior to your CT if your heart rate is greater than 55   No facility-administered encounter medications on file as of 01/29/2020.    Allergies:  Allergies  Allergen Reactions  . Ranexa [Ranolazine]     10-20-19    Family History: Family History  Problem Relation Age of Onset  . Hyperlipidemia Mother   . Hypertension Mother   . Depression Mother   . Hyperlipidemia Father   . Skin cancer Father   . Hypertension Brother   . Diabetes Brother   . Heart attack Maternal Grandmother   . Heart disease Maternal Grandmother   . Heart attack Maternal Grandfather   . Dementia Paternal Grandmother   . Colon cancer Paternal Grandfather   . Hypertension Brother   . Hyperlipidemia Brother   . Sleep apnea Brother   . Diabetes Brother   . Diabetes Son     Social History: Social History   Tobacco Use  . Smoking status: Former Smoker    Quit date: 2019    Years since quitting: 2.8  . Smokeless tobacco: Never Used  . Tobacco comment: on and off for 20 years   Vaping Use  . Vaping Use: Former  Substance Use Topics  . Alcohol use: Yes    Alcohol/week: 1.0 - 2.0 standard drink    Types: 1 - 2 Standard drinks or equivalent per week  . Drug use: Yes    Types: Marijuana    Comment: at night for insomnia sometimes    Social History   Social History Narrative   Lives alone    Right handed   Caffeine: 1 cup/day    Vital Signs:  BP (!) 155/88   Pulse 71   Ht 5' 5" (1.651 m)   Wt 203 lb (92.1 kg)   SpO2 98%   BMI 33.78 kg/m   Neurological Exam: MENTAL STATUS including orientation to time, place, person, recent and remote memory, attention span and concentration, language, and fund of knowledge is normal.  Speech is not dysarthric.  She is anxious and perseverates on how the medication caused her problems.  Towards the end of the visit, she became tearful  CRANIAL NERVES: II:  No visual field defects.   Unremarkable fundi.   III-IV-VI: Pupils equal round and reactive to light.  Normal conjugate, extra-ocular eye movements in all directions of gaze.  No nystagmus.  No ptosis.   V:  Normal facial sensation.    VII:  Normal facial symmetry and movements.   VIII:  Normal hearing and vestibular function.   IX-X:  Normal palatal movement.   XI:  Normal shoulder shrug and head rotation.   XII:  Normal tongue strength and range of motion, no deviation or fasciculation.  MOTOR:  No atrophy, fasciculations or abnormal movements.  No pronator drift.   Upper Extremity:  Right  Left  Deltoid  5/5   5/5   Biceps  5/5   5/5   Triceps  5/5   5/5  Infraspinatus 5/5  5/5  Medial pectoralis 5/5  5/5  Wrist extensors  5/5   5/5   Wrist flexors  5/5   5/5   Finger extensors  5/5   5/5   Finger flexors  5/5   5/5   Dorsal interossei  5/5   5/5   Abductor pollicis  5/5   5/5   Tone (Ashworth scale)  0  0   Lower Extremity:  Right  Left  Hip flexors  5/5   5/5   Hip extensors  5/5   5/5   Adductor 5/5  5/5  Abductor 5/5  5/5  Knee flexors  5/5   5/5   Knee extensors  5/5   5/5   Dorsiflexors  5/5   5/5   Plantarflexors  5/5   5/5   Toe extensors  5/5   5/5   Toe flexors  5/5   5/5   Tone (Ashworth scale)  0  0   MSRs:  Right        Left                  brachioradialis 2+  2+  biceps 2+  2+  triceps 2+  2+  patellar 2+  2+  ankle jerk 2+  2+  Hoffman no  no  plantar response down  down   SENSORY:  Patchy loss of temperature and pin prick in the legs.  Otherwise, normal and symmetric perception of light touch, pinprick, vibration, and proprioception.   COORDINATION/GAIT: Normal finger-to- nose-finger and heel-to-shin.  Intact rapid alternating movements bilaterally. Gait is slow, intermittently wide-based, stable, unassisted. Tandem and stressed gait intact.    IMPRESSION: Myriad of somatic symptoms (left face/neck numbness, speech changes, vision complaints, weakness, imbalance),  which do not conform to a central or peripheral nervous system condition.  Neurological exam is non-focal and her testing has excluded multiple sclerosis, stroke, tumor, or compressive pathology of the cervical spine.  I have not seen her symptoms as an adverse effect of Ranexa and cannot isolate organic nerve pathology to suggest this is the cause. To be complete, I will check vitamin B12, folate, and vitamin B1 for her paresthesias. She is frustrated that I do not have an answer or treatment plan.  I explained that not all symptoms translate into neurological disease/conditions and management is focused at coping with symptoms through counseling for coping mechanisms. She will consider this and discuss with PCP, if she chooses this route.    Thank you for allowing me to participate in patient's care.  If I can answer any additional questions, I would be pleased to do so.    Sincerely,    Posey Petrik K. Posey Pronto, DO

## 2020-02-01 LAB — VITAMIN B1: Vitamin B1 (Thiamine): 33 nmol/L — ABNORMAL HIGH (ref 8–30)

## 2020-02-10 ENCOUNTER — Telehealth (INDEPENDENT_AMBULATORY_CARE_PROVIDER_SITE_OTHER): Payer: 59 | Admitting: Cardiovascular Disease

## 2020-02-10 ENCOUNTER — Encounter: Payer: Self-pay | Admitting: Cardiovascular Disease

## 2020-02-10 VITALS — Ht 65.0 in | Wt 191.0 lb

## 2020-02-10 DIAGNOSIS — R0683 Snoring: Secondary | ICD-10-CM

## 2020-02-10 DIAGNOSIS — R0789 Other chest pain: Secondary | ICD-10-CM | POA: Diagnosis not present

## 2020-02-10 DIAGNOSIS — F419 Anxiety disorder, unspecified: Secondary | ICD-10-CM

## 2020-02-10 DIAGNOSIS — G4733 Obstructive sleep apnea (adult) (pediatric): Secondary | ICD-10-CM

## 2020-02-10 DIAGNOSIS — I10 Essential (primary) hypertension: Secondary | ICD-10-CM | POA: Diagnosis not present

## 2020-02-10 DIAGNOSIS — E782 Mixed hyperlipidemia: Secondary | ICD-10-CM | POA: Diagnosis not present

## 2020-02-10 NOTE — Patient Instructions (Signed)
Medication Instructions:  No Changes In Medications at this time.  *If you need a refill on your cardiac medications before your next appointment, please call your pharmacy*  Follow-Up: At [facility], you and your health needs are our priority.  As part of our continuing mission to provide you with exceptional heart care, we have created designated Provider Care Teams.  These Care Teams include your primary Cardiologist (physician) and Advanced Practice Providers (APPs -  Physician Assistants and Nurse Practitioners) who all work together to provide you with the care you need, when you need it.  Your next appointment:   12 month(s)  The format for your next appointment:   In Person  Provider:   Shelva Majestic, MD

## 2020-02-10 NOTE — Progress Notes (Signed)
Virtual Visit via Video Note   This visit type was conducted due to national recommendations for restrictions regarding the COVID-19 Pandemic (e.g. social distancing) in an effort to limit this patient's exposure and mitigate transmission in our community.  Due to her co-morbid illnesses, this patient is at least at moderate risk for complications without adequate follow up.  This format is felt to be most appropriate for this patient at this time.  All issues noted in this document were discussed and addressed.  A limited physical exam was performed with this format.  Please refer to the patient's chart for her consent to telehealth for [facility].       Date:  02/10/2020   ID:  Yolanda Sanchez, DOB 1961/02/22, MRN [id number] The patient was identified using 2 identifiers.  Patient Location: Home Provider Location: Home Office  PCP:  Marge Duncans, PA-C  Cardiologist: Dr. Agustin Cree; Shelva Majestic, MD (sleep) Electrophysiologist:  None   Chief Complaint: New evaluation for sleep apnea  History of Present Illness:    Yolanda Sanchez is a 59 y.o. female who has a history of essential hypertension, mixed hyperlipidemia, episodic chest pain with an abnormal stress test who has seen Drs. Krasowski and Revankar.  She presents for initial sleep evaluation following a sleep study and ultimate AutoPap therapy.  Ms. Brinker states that she has had symptoms of snoring, daytime sleepiness, and nonrestorative sleep for some time.  Apparently, in September 2020 she had a home sleep test which was notable for moderate overall sleep apnea with an AHI of 25.2/h.  She had significant oxygen desaturation to a nadir of 79%.  Since this was a home study, the severity of sleep apnea during REM sleep was unable to be assessed.  She snored 48% of her sleep duration.  Apparently, she was denied by insurance undergo an in lab CPAP titration.  For some reason, over 1 year had elapsed and finally in  October 2021 she instituted AutoPap initial therapy.  Prior to starting treatment, she did have significant other issues and apparently underwent neurologic evaluation and apparently had developed some side effects when she was initiated given a trial of ranolazine for her chest pain.  She was never given an answer as to her constellation of symptoms which included blurred vision, eye twitching, difficulty swallowing, facial weakness, gait difficulty, stuttering and slurred speech.  An MRI of her brain and cervical spinal cord was normal.  From a sleep perspective, she typically goes to bed late often midnight or later and wakes up approximately 6 AM.  She had significant snoring and daytime sleepiness with nonrestorative sleep prior to initiating AutoPap.  Over the past year she states that she has had a 40 to 50 pound weight loss purposefully with a peak weight at approximately 260.  A download was obtained today of her AutoPap from January 10, 2020 through February 08, 2020.  This revealed 93% of usage days with usage greater than 4 hours at 73%.  She was averaging 5 hours and 30 minutes of CPAP use per night.  Her AutoPap was set at a minimum pressure of 6 and maximum of 20.  Her 95th percentile pressure is 11.2 with a maximum average pressure of 12.4.  AHI is 1.4.  She has felt improved since initiating CPAP therapy.  Presently she is unaware of breakthrough snoring.  She denies awakening gasping for breath.  Her sleep is more restorative.  She presents for initial evaluation with me.  The  patient does not have symptoms concerning for COVID-19 infection (fever, chills, cough, or new shortness of breath).    Past Medical History:  Diagnosis Date  . Abnormal stress test 11/10/2019  . Anxiety   . Atypical chest pain 09/15/2019  . Depression   . Gait abnormality 11/03/2019  . HSV infection    1 & 2   . Hypercholesterolemia 09/11/2018  . Hypertension   . Left facial numbness 10/25/2019  . Mixed  hyperlipidemia 11/03/2019  . Obstructive sleep apnea 09/11/2018  . Palpitations 09/15/2019  . Paresthesia 11/03/2019  . Swelling of both lower extremities 09/11/2018   Past Surgical History:  Procedure Laterality Date  . BREAST BIOPSY Left   . CHOLECYSTECTOMY    . COLONOSCOPY    . TUBAL LIGATION       Current Meds  Medication Sig  . albuterol (VENTOLIN HFA) 108 (90 Base) MCG/ACT inhaler Inhale 2 puffs into the lungs every 6 (six) hours as needed for wheezing or shortness of breath.  Marland Kitchen aspirin EC 81 MG tablet Take 1 tablet (81 mg total) by mouth daily. Swallow whole.  Marland Kitchen buPROPion (WELLBUTRIN XL) 300 MG 24 hr tablet Take 300 mg by mouth daily.  . fluticasone (FLONASE) 50 MCG/ACT nasal spray Place 1 spray into both nostrils daily as needed for allergies or rhinitis.  Marland Kitchen lisinopril-hydrochlorothiazide (ZESTORETIC) 10-12.5 MG tablet Take 1 tablet by mouth daily.  . meloxicam (MOBIC) 7.5 MG tablet Take 7.5 mg by mouth daily. As needed.  . montelukast (SINGULAIR) 10 MG tablet Take 1 tablet by mouth daily.   . nitroGLYCERIN (NITROSTAT) 0.4 MG SL tablet Place 1 tablet (0.4 mg total) under the tongue every 5 (five) minutes as needed.     Allergies:   Ranexa [ranolazine]   Social History   Tobacco Use  . Smoking status: Former Smoker    Quit date: 2019    Years since quitting: 2.9  . Smokeless tobacco: Never Used  . Tobacco comment: on and off for 20 years   Vaping Use  . Vaping Use: Former  Substance Use Topics  . Alcohol use: Yes    Alcohol/week: 1.0 - 2.0 standard drink    Types: 1 - 2 Standard drinks or equivalent per week  . Drug use: Yes    Types: Marijuana    Comment: at night for insomnia sometimes     Additional social history is notable that she is divorced.  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  Her job is high stress.  She has 1 son who has sleep apnea.  Family Hx: The patient's family history includes Colon cancer in her paternal grandfather; Dementia in her  paternal grandmother; Depression in her mother; Diabetes in her brother, brother, and son; Heart attack in her maternal grandfather and maternal grandmother; Heart disease in her maternal grandmother; Hyperlipidemia in her brother, father, and mother; Hypertension in her brother, brother, and mother; Skin cancer in her father; Sleep apnea in her brother.  Her father is deceased as is her mother. 2 brothers have sleep apnea; 1 brother is deceased  ROS:   Please see the history of present illness.    History of previous morbid obesity with peak weight at 260 pounds now reduced to 191. Constellation of symptoms as noted above which he believes may have been related to ranolazine. No wheezing No exertional chest discomfort No awareness of atrial fibrillation Positive for dyslipidemia No significant edema History of depression All other systems reviewed and are negative.  Prior CV studies:   The following studies were reviewed today:  Patient Name: Etosha, Wetherell Date: 12/02/2018 Gender: Female D.O.B: [birth year] Age (years): 58 Referring Provider: Park Liter Height (inches): 61 Interpreting Physician: Shelva Majestic MD, ABSM Weight (lbs): 258 RPSGT: Jacolyn Reedy BMI: 44 MRN: [id number] Neck Size: 15.50  CLINICAL INFORMATION Sleep Study Type: HST  Indication for sleep study: Snoring  Epworth Sleepiness Score: 4  SLEEP STUDY TECHNIQUE A multi-channel overnight portable sleep study was performed. The channels recorded were: nasal airflow, thoracic respiratory movement, and oxygen saturation with a pulse oximetry. Snoring was also monitored.  MEDICATIONS     buPROPion (WELLBUTRIN XL) 300 MG 24 hr tablet             lisinopril-hydrochlorothiazide (ZESTORETIC) 10-12.5 MG tablet         meloxicam (MOBIC) 7.5 MG tablet         montelukast (SINGULAIR) 10 MG tablet         valACYclovir (VALTREX) 500 MG tablet      Patient self administered medications  include: N/A.  SLEEP ARCHITECTURE Patient was studied for 450 minutes. The sleep efficiency was 100.0 % and the patient was supine for 97%. The arousal index was 0.0 per hour.  RESPIRATORY PARAMETERS The overall AHI was 25.2 per hour, with a central apnea index of 0.0 per hour.   The oxygen nadir was 79% during sleep.  CARDIAC DATA Mean heart rate during sleep was 69.2 bpm.  IMPRESSIONS - Moderate obstructive sleep apnea (AHI 25.2/h). Since this was a home study the severity of sleep apnea during REM sleep cannot be determined. - No significant central sleep apnea occurred during this study (CAI = 0.0/h).. - Severe oxygen desaturation to a nadir of O2 79%. Time spent < 89% was 15.2 minutes - Patient snored 48.0% during the sleep.  DIAGNOSIS - Obstructive Sleep Apnea (327.23 [G47.33 ICD-10]) - Nocturnal Hypoxemia (327.26 [G47.36 ICD-10])  RECOMMENDATIONS - In this patient with cardiovascular comorbidities and oxygen desaturation recommend a CPAP titration study to optimally assess her sleep disordered breathing. If unable to do an in-lab titration initiate AutoPap with EPR of 3 at 6 - 20 cm water. - Effort should be made to optimize nasal and oropharyngeal patency. - Avoid alcohol, sedatives and other CNS depressants that may worsen sleep apnea and disrupt normal sleep architecture. - Sleep hygiene should be reviewed to assess factors that may improve sleep quality. - Weight management (BMI 44) and regular exercise should be initiated or continued. - Recommend a download after 30 days of CPAP initiation and sleep clinic evaluation.  [Electronically signed] 12/07/2018 11:35 AM    Labs/Other Tests and Data Reviewed:    EKG:  An ECG dated October 20, 2019 was personally reviewed today and demonstrated: Normal sinus rhythm at 85 bpm.  Recent Labs: 10/20/2019: ALT 17; Hemoglobin 14.7; Magnesium 2.0; Platelets 219 11/03/2019: TSH 1.030 12/08/2019: BUN 16; Creatinine, Ser 0.93;  Potassium 3.8; Sodium 141   Recent Lipid Panel Lab Results  Component Value Date/Time   CHOL 144 09/15/2019 04:49 PM   TRIG 66 09/15/2019 04:49 PM   HDL 50 09/15/2019 04:49 PM   CHOLHDL 2.9 09/15/2019 04:49 PM   LDLCALC 81 09/15/2019 04:49 PM    Wt Readings from Last 3 Encounters:  02/10/20 191 lb (86.6 kg)  01/29/20 203 lb (92.1 kg)  11/26/19 212 lb 9.6 oz (96.4 kg)      Objective:    Vital Signs:  Ht 5\' 5"  (1.651 m)  Wt 191 lb (86.6 kg)   BMI 31.78 kg/m    This was a video visit. The patient appeared in no acute distress Breathing was normal and not labored There was no audible wheezing She denied any rhythm disturbance She did not have the ability to take her blood pressure today No obvious swelling Normal affect and mood  ASSESSMENT & PLAN:    1. Obstructive sleep apnea: I had a long discussion with Ms. Hottenstein.  I reviewed her initial home sleep study from September 2020 which confirmed overall moderate sleep apnea but she had significant oxygen desaturation to a nadir of 79%.  I suspect her sleep apnea was more severe during REM sleep.  She snored essentially half of the night.  She was ultimately denied an in lab CPAP titration and for some reason there was a significant delay in her AutoPap institution.  This may have been related to her recent neurologic issues for which she had been evaluated.  I obtained a download of her CPAP unit.  She is meeting compliance standards.  However usage duration is only 5 hours and 30 minutes.  I discussed with her the importance of sleeping ideally for 7 to 8 hours if at all possible.  I also discussed that the preponderance of REM sleep occurs in the second half of the night and the importance of using therapy for the nights duration since more often sleep apnea is worse during REM sleep.  Fortunately she has lost weight since her initial evaluation which undoubtedly is contributing to improvement in her sleep apnea.  There is  approximately a 60+ pound weight loss from her peak weight of 260 pounds.  I also discussed the importance of continued exercise and weight loss management.  I thoroughly reviewed the effects of sleep apnea on normal sleep architecture as well as the potential adverse cardiovascular consequences if sleep apnea is untreated particularly with reference to its effect on blood pressure, nocturnal arrhythmias with increased risk for atrial fibrillation, potential for nocturnal ischemia in the setting of significant development of nocturnal hypoxia as well as its effects on glucose metabolism and GERD.  I will now make adjustments to her AutoPap and change her from a 6-20 range to at 8-16 range particularly with her 95th percentile pressure 11.2 cm of water. 2. Essential hypertension.  Currently on lisinopril HCT 10/12.5 mg.  I discussed hypertensive guidelines with stage I hypertension beginning at 130/80 and stage II hypertension at 140/90.  She was unable to take her blood pressure today.  Her blood pressure when seen by Dr. Geraldo Pitter in September was elevated at 144/96. 3. Hyperlipidemia: She will be having follow-up laboratory in February 12, 2020 by her primary physician 4. History of anxiety/depression, currently on bupropion. 5. History of chest pain: CTA December 11, 2019 with calcium score 0 6. Obesity: Purposeful weight loss with peak weight 260 now down to 191    COVID-19 Education: The signs and symptoms of COVID-19 were discussed with the patient and how to seek care for testing (follow up with PCP or arrange E-visit).  The importance of social distancing was discussed today.  Time:   Today, I have spent 30 minutes with the patient with telehealth technology discussing the above problems.     Medication Adjustments/Labs and Tests Ordered: Current medicines are reviewed at length with the patient today.  Concerns regarding medicines are outlined above.   Tests Ordered: No orders of the defined  types were placed in this encounter.  Medication Changes: No orders of the defined types were placed in this encounter.   Follow Up: In office evaluation in 1 year  Signed, Shelva Majestic, MD  02/10/2020 8:30 AM    [place]

## 2020-02-11 ENCOUNTER — Ambulatory Visit (INDEPENDENT_AMBULATORY_CARE_PROVIDER_SITE_OTHER): Payer: 59 | Admitting: Allergy and Immunology

## 2020-02-11 ENCOUNTER — Ambulatory Visit: Payer: 59 | Admitting: Physician Assistant

## 2020-02-11 ENCOUNTER — Other Ambulatory Visit: Payer: Self-pay

## 2020-02-11 ENCOUNTER — Encounter: Payer: Self-pay | Admitting: Allergy and Immunology

## 2020-02-11 VITALS — BP 110/62 | HR 95 | Resp 16 | Ht 65.0 in | Wt 195.6 lb

## 2020-02-11 DIAGNOSIS — L719 Rosacea, unspecified: Secondary | ICD-10-CM | POA: Diagnosis not present

## 2020-02-11 DIAGNOSIS — J4530 Mild persistent asthma, uncomplicated: Secondary | ICD-10-CM | POA: Diagnosis not present

## 2020-02-11 DIAGNOSIS — Z79899 Other long term (current) drug therapy: Secondary | ICD-10-CM

## 2020-02-11 DIAGNOSIS — J3089 Other allergic rhinitis: Secondary | ICD-10-CM | POA: Diagnosis not present

## 2020-02-11 MED ORDER — LOSARTAN POTASSIUM 50 MG PO TABS: 50.0000 mg | ORAL_TABLET | Freq: Every day | ORAL | 3 refills | Status: DC

## 2020-02-11 MED ORDER — MONTELUKAST SODIUM 10 MG PO TABS: 10.0000 mg | ORAL_TABLET | Freq: Every day | ORAL | 5 refills | Status: DC

## 2020-02-11 MED ORDER — ALVESCO 160 MCG/ACT IN AERS: 1.0000 | INHALATION_SPRAY | Freq: Two times a day (BID) | RESPIRATORY_TRACT | 5 refills | Status: DC

## 2020-02-11 MED ORDER — ALBUTEROL SULFATE HFA 108 (90 BASE) MCG/ACT IN AERS: 2.0000 | INHALATION_SPRAY | Freq: Four times a day (QID) | RESPIRATORY_TRACT | 1 refills | Status: DC | PRN

## 2020-02-11 MED ORDER — METRONIDAZOLE 0.75 % EX CREA: TOPICAL_CREAM | Freq: Two times a day (BID) | CUTANEOUS | 5 refills | Status: DC

## 2020-02-11 NOTE — Progress Notes (Signed)
[place] - [place] - [place] - [place] - [place]   Dear Dr. Gaylyn Cheers,  Thank you for referring Yolanda Sanchez to the [facility] on 02/11/2020.   Below is a summation of this patient's evaluation and recommendations.  Thank you for your referral. I will keep you informed about this patient's response to treatment.   If you have any questions please do not hesitate to contact me.   Sincerely,  Jiles Prows, MD Allergy / Immunology [place]   ______________________________________________________________________    NEW PATIENT NOTE  Referring Provider: Cletis Athens, MD Primary Provider: Marge Duncans, Hershal Coria Date of office visit: 02/11/2020    Subjective:   Chief Complaint:  Yolanda Sanchez (DOB: 1960/05/08) is a 59 y.o. female who Sanchez to the clinic on 02/11/2020 with a chief complaint of Asthma and Allergic Rhinitis  .     HPI: Yolanda Sanchez to this clinic in evaluation of recurrent respiratory tract problems.  She apparently has a long history of allergic rhinitis with some nasal congestion and sneezing but over the course of the past 18 months to 24 months she has developed very significant problems with her airway. She states that she has developed recurrent problems with "sinusitis" and "bronchitis". Basically she develops these scenarios where she gets trouble breathing and coughing with post tussive urination and watery eyes and intermittent sneezing and earaches. Over the course of the past 18 months she has been treated 3 times with antibiotics and steroids for these issues. She had 2 chest x-rays which have been "normal".  In addition, she has had a decreased ability to smell for about the past 3 weeks. She apparently did have a CT scan of her sinuses ordered by Dr. Jerilynn Mages, ENT, which did not identify any sinus disease but did identify a deviated  septum.  She does have a history of classic reflux for which she would occasionally take a Zantac but since she has lost approximately 60 pounds of weight in the past year and she is really much better with that issue. She still drinks 1 coffee per day and occasional Coke but does not have any chocolate or alcohol and does not smoke tobacco.  All of her respiratory tract symptoms appear to occur while consistently using Singulair and occasionally using Flonase.  She is now using CPAP over the course of the past 3 months and apparently her AHI went from a high of 25 to a low of 1.  She is using lisinopril to control her hypertension.  Past Medical History:  Diagnosis Date  . Abnormal stress test 11/10/2019  . Anxiety   . Asthma   . Atypical chest pain 09/15/2019  . Depression   . Gait abnormality 11/03/2019  . HSV infection    1 & 2   . Hypercholesterolemia 09/11/2018  . Hypertension   . Left facial numbness 10/25/2019  . Mixed hyperlipidemia 11/03/2019  . Obstructive sleep apnea 09/11/2018  . Palpitations 09/15/2019  . Paresthesia 11/03/2019  . Swelling of both lower extremities 09/11/2018    Past Surgical History:  Procedure Laterality Date  . BREAST BIOPSY Left   . CHOLECYSTECTOMY    . COLONOSCOPY    . TUBAL LIGATION      Allergies as of 02/11/2020      Reactions   Ranexa [ranolazine]    10-20-19      Medication List      albuterol  108 (90 Base) MCG/ACT inhaler  Commonly known as: VENTOLIN HFA Inhale 2 puffs into the lungs every 6 (six) hours as needed for wheezing or shortness of breath.   aspirin EC 81 MG tablet Take 1 tablet (81 mg total) by mouth daily. Swallow whole.   buPROPion 300 MG 24 hr tablet Commonly known as: WELLBUTRIN XL Take 300 mg by mouth daily.   fluticasone 50 MCG/ACT nasal spray Commonly known as: FLONASE Place 1 spray into both nostrils daily as needed for allergies or rhinitis.   lisinopril-hydrochlorothiazide 10-12.5 MG tablet Commonly known as:  ZESTORETIC Take 1 tablet by mouth daily.   meloxicam 7.5 MG tablet Commonly known as: MOBIC Take 7.5 mg by mouth daily. As needed.   montelukast 10 MG tablet Commonly known as: SINGULAIR Take 1 tablet by mouth daily.   nitroGLYCERIN 0.4 MG SL tablet Commonly known as: NITROSTAT Place 1 tablet (0.4 mg total) under the tongue every 5 (five) minutes as needed.   valACYclovir 1000 MG tablet Commonly known as: VALTREX Take 1 tablet (1,000 mg total) by mouth daily.       Review of systems negative except as noted in HPI / PMHx or noted below:  Review of Systems  Constitutional: Negative.   HENT: Negative.   Eyes: Negative.   Respiratory: Negative.   Cardiovascular: Negative.   Gastrointestinal: Negative.   Genitourinary: Negative.   Musculoskeletal: Negative.   Skin: Negative.   Neurological: Negative.   Endo/Heme/Allergies: Negative.   Psychiatric/Behavioral: Negative.     Family History  Problem Relation Age of Onset  . Hyperlipidemia Mother   . Hypertension Mother   . Depression Mother   . Hyperlipidemia Father   . Skin cancer Father   . Hypertension Brother   . Diabetes Brother   . Heart attack Maternal Grandmother   . Heart disease Maternal Grandmother   . Heart attack Maternal Grandfather   . Dementia Paternal Grandmother   . Colon cancer Paternal Grandfather   . Hypertension Brother   . Hyperlipidemia Brother   . Sleep apnea Brother   . Diabetes Brother   . Diabetes Son   . Heart failure Sister   . Atrial fibrillation Sister   . Allergic rhinitis Neg Hx   . Angioedema Neg Hx   . Asthma Neg Hx   . Atopy Neg Hx   . Eczema Neg Hx   . Immunodeficiency Neg Hx   . Urticaria Neg Hx     Social History   Socioeconomic History  . Marital status: Divorced    Spouse name: Not on file  . Number of children: 2  . Years of education: Not on file  . Highest education level: Not on file  Occupational History  . Not on file  Tobacco Use  . Smoking  status: Former Smoker    Quit date: 2019    Years since quitting: 2.9  . Smokeless tobacco: Never Used  . Tobacco comment: on and off for 20 years   Vaping Use  . Vaping Use: Former  Substance and Sexual Activity  . Alcohol use: Yes    Alcohol/week: 1.0 - 2.0 standard drink    Types: 1 - 2 Standard drinks or equivalent per week  . Drug use: Yes    Types: Marijuana    Comment: at night for insomnia sometimes   . Sexual activity: Not on file  Other Topics Concern  . 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  Objective:   Vitals:   02/11/20 1456  BP: 110/62  Pulse: 95  Resp: 16  SpO2: 96%   Height: 5\' 5"  (165.1 cm) Weight: 195 lb 9.6 oz (88.7 kg)  Physical Exam Constitutional:      Appearance: She is not diaphoretic.  HENT:     Head: Normocephalic.     Right Ear: Tympanic membrane, ear canal and external ear normal.     Left Ear: Tympanic membrane, ear canal and external ear normal.     Nose: Nose normal. No mucosal edema or rhinorrhea.     Mouth/Throat:     Pharynx: Uvula midline. No oropharyngeal exudate.  Eyes:     Conjunctiva/sclera: Conjunctivae normal.  Neck:     Thyroid: No thyromegaly.     Trachea: Trachea normal. No tracheal tenderness or tracheal deviation.  Cardiovascular:     Rate and Rhythm: Normal rate and regular rhythm.     Heart sounds: Normal heart sounds, S1 normal and S2 normal. No murmur heard.   Pulmonary:     Effort: No respiratory distress.     Breath sounds: Normal breath sounds. No stridor. No wheezing or rales.  Lymphadenopathy:     Head:     Right side of head: No tonsillar adenopathy.     Left side  of head: No tonsillar adenopathy.     Cervical: No cervical adenopathy.  Skin:    Findings: No erythema or rash (Facial erythema with cystic lesions.).     Nails: There is no clubbing.  Neurological:     Mental Status: She is alert.     Diagnostics: Allergy skin tests were performed.  She did not demonstrate any hypersensitivity against a screening panel of aeroallergens or foods.  Spirometry was performed and demonstrated an FEV1 of 2.61 @ 96 % of predicted. FEV1/FVC = 0.84.  Following administration of inhaled albuterol her FEV1 did not change significantly  Assessment and Plan:    1. Not well controlled mild persistent asthma   2. Perennial allergic rhinitis   3. Rosacea   4. On angiotensin-converting enzyme (ACE) inhibitors     1. Allergen avoidance measures?  2. Treat and prevent inflammation:   A. Flonase - 1 spray each nostril 2 times per day  B. Alvesco 160 - 1 inhalation 2 times per day with spacer  C. Montelukast 10 mg - 1 tablet 1 time per day  3. If needed:   A. Albuterol HFA - 2 inhalations every 4-6 hours  4. Treat and prevent rosacea:   A. Metrocream applied to face 2 times per day  5. Change lisinopril to losartan 50 mg 1 time per day. Check blood pressure  6. Reviewed results of recent chest x-ray and sinus CT scan  7. Return to clinic in 4 weeks or earlier if problem  8. Reflux / LPR???  Tiannah has some irritation and inflammation of her airway and we will treat her with anti-inflammatory agents for both her upper and lower airway as noted above.  She is on lisinopril and I imagine she intermittently increases her bradykinin levels quite significantly with the use of this medication thus leading to the  development of cough and will eliminate her lisinopril and give her a ARB as replacement.  She appears to have rosacea and we will give her some MetroCream.  We need to consider the possibility that one of the triggers for her respiratory tract  inflammation and irritation is reflux but will hold off on any further evaluation or treatment for that issue until we see her back in this clinic in 4 weeks to assess her response to the approach noted above.  Jiles Prows, MD Allergy / Immunology [facility]

## 2020-02-11 NOTE — Patient Instructions (Addendum)
  1. Allergen avoidance measures?  2. Treat and prevent inflammation:   A. Flonase - 1 spray each nostril 2 times per day  B. Alvesco 160 - 1 inhalation 2 times per day with spacer  C. Montelukast 10 mg - 1 tablet 1 time per day  3. If needed:   A. Albuterol HFA - 2 inhalations every 4-6 hours  4. Treat and prevent rosacea:   A. Metrocream applied to face 2 times per day  5. Change lisinopril to losartan 50 mg 1 time per day. Check blood pressure  6. Reviewed results of recent chest x-ray and sinus CT scan  7. Return to clinic in 4 weeks or earlier if problem  8. Reflux / LPR???

## 2020-02-12 ENCOUNTER — Ambulatory Visit: Payer: 59 | Admitting: Neurology

## 2020-02-12 ENCOUNTER — Ambulatory Visit (INDEPENDENT_AMBULATORY_CARE_PROVIDER_SITE_OTHER): Payer: 59 | Admitting: Cardiology

## 2020-02-12 ENCOUNTER — Encounter: Payer: Self-pay | Admitting: Cardiology

## 2020-02-12 ENCOUNTER — Other Ambulatory Visit: Payer: Self-pay

## 2020-02-12 ENCOUNTER — Ambulatory Visit: Payer: 59 | Admitting: Physician Assistant

## 2020-02-12 VITALS — BP 110/76 | HR 74 | Ht 65.0 in | Wt 194.0 lb

## 2020-02-12 DIAGNOSIS — I10 Essential (primary) hypertension: Secondary | ICD-10-CM

## 2020-02-12 DIAGNOSIS — R9439 Abnormal result of other cardiovascular function study: Secondary | ICD-10-CM | POA: Diagnosis not present

## 2020-02-12 DIAGNOSIS — F419 Anxiety disorder, unspecified: Secondary | ICD-10-CM

## 2020-02-12 DIAGNOSIS — R0789 Other chest pain: Secondary | ICD-10-CM

## 2020-02-12 DIAGNOSIS — E782 Mixed hyperlipidemia: Secondary | ICD-10-CM | POA: Diagnosis not present

## 2020-02-12 NOTE — Progress Notes (Signed)
Cardiology Office Note:    Date:  02/12/2020   ID:  Yolanda Sanchez, DOB 23-Oct-1960, MRN [id number]  PCP:  Marge Duncans, PA-C  Cardiologist:  Jenne Campus, MD    Referring MD: Marge Duncans, PA-C   Chief Complaint  Patient presents with   Follow-up  I am doing much better   History of Present Illness:    Yolanda Sanchez is a 59 y.o. female with past medical history significant for essential hypertension, obstructive sleep apnea, atypical chest pain she did have a stress test done which showed small area of ischemia after that trial of ranolazine initiated she developed some very strange side effects of this medication that been discontinued eventually she ended up having quite extensive neurological evaluation with being completely negative.  She also got coronary CT angio which showed no significant coronary artery disease.  She comes today 2 months to follow-up and discuss this.  Overall she is doing very well still complain of having the side effects that she suffer from ranolazine which include face numbness.  However she also started dieting she lost significant months of weights she feels significantly better, blood pressure is much improved.  Also her sleep apnea got much but overall there is a lot of improvement in her life that happened and she is very happy and satisfied when she feels.  Past Medical History:  Diagnosis Date   Abnormal stress test 11/10/2019   Anxiety    Asthma    Atypical chest pain 09/15/2019   Depression    Gait abnormality 11/03/2019   HSV infection    1 & 2    Hypercholesterolemia 09/11/2018   Hypertension    Left facial numbness 10/25/2019   Mixed hyperlipidemia 11/03/2019   Obstructive sleep apnea 09/11/2018   Palpitations 09/15/2019   Paresthesia 11/03/2019   Swelling of both lower extremities 09/11/2018    Past Surgical History:  Procedure Laterality Date   BREAST BIOPSY Left    CHOLECYSTECTOMY     COLONOSCOPY      TUBAL LIGATION      Current Medications: Current Meds  Medication Sig   albuterol (VENTOLIN HFA) 108 (90 Base) MCG/ACT inhaler Inhale 2 puffs into the lungs every 6 (six) hours as needed for wheezing or shortness of breath.   aspirin EC 81 MG tablet Take 1 tablet (81 mg total) by mouth daily. Swallow whole.   buPROPion (WELLBUTRIN XL) 300 MG 24 hr tablet Take 300 mg by mouth daily.   ciclesonide (ALVESCO) 160 MCG/ACT inhaler Inhale 1 puff into the lungs 2 (two) times daily. with spacer   fluticasone (FLONASE) 50 MCG/ACT nasal spray Place 1 spray into both nostrils daily as needed for allergies or rhinitis.   losartan (COZAAR) 50 MG tablet Take 1 tablet (50 mg total) by mouth daily.   meloxicam (MOBIC) 7.5 MG tablet Take 7.5 mg by mouth daily. As needed.   metroNIDAZOLE (METROCREAM) 0.75 % cream Apply topically 2 (two) times daily.   montelukast (SINGULAIR) 10 MG tablet Take 1 tablet (10 mg total) by mouth at bedtime.   nitroGLYCERIN (NITROSTAT) 0.4 MG SL tablet Place 1 tablet (0.4 mg total) under the tongue every 5 (five) minutes as needed.     Allergies:   Ranexa [ranolazine]   Social History   Socioeconomic History   Marital status: Divorced    Spouse name: Not on file   Number of children: 2   Years of education: Not on file   Highest education level: Not on  file  Occupational History   Not on file  Tobacco Use   Smoking status: Former Smoker    Quit date: 2019    Years since quitting: 2.9   Smokeless tobacco: Never Used   Tobacco comment: on and off for 20 years   Vaping Use   Vaping Use: Former  Substance and Sexual Activity   Alcohol use: Yes    Alcohol/week: 1.0 - 2.0 standard drink    Types: 1 - 2 Standard drinks or equivalent per week   Drug use: Yes    Types: Marijuana    Comment: at night for insomnia sometimes    Sexual activity: Not on file  Other Topics Concern   Not on file  Social History Narrative   Lives alone    Right handed    Caffeine: 1 cup/day   Social Determinants of Health   Financial Resource Strain:    Difficulty of Paying Living Expenses: Not on file  Food Insecurity:    Worried About Charity fundraiser in the Last Year: Not on file   [employer] of Food in the Last Year: Not on file  Transportation Needs:    Lack of Transportation (Medical): Not on file   Lack of Transportation (Non-Medical): Not on file  Physical Activity:    Days of Exercise per Week: Not on file   Minutes of Exercise per Session: Not on file  Stress:    Feeling of Stress : Not on file  Social Connections:    Frequency of Communication with Friends and Family: Not on file   Frequency of Social Gatherings with Friends and Family: Not on file   Attends Religious Services: Not on file   Active Member of Clubs or Organizations: Not on file   Attends Archivist Meetings: Not on file   Marital Status: Not on file     Family History: The patient's family history includes Atrial fibrillation in her sister; Colon cancer in her paternal grandfather; Dementia in her paternal grandmother; Depression in her mother; Diabetes in her brother, brother, and son; Heart attack in her maternal grandfather and maternal grandmother; Heart disease in her maternal grandmother; Heart failure in her sister; Hyperlipidemia in her brother, father, and mother; Hypertension in her brother, brother, and mother; Skin cancer in her father; Sleep apnea in her brother. There is no history of Allergic rhinitis, Angioedema, Asthma, Atopy, Eczema, Immunodeficiency, or Urticaria. ROS:   Please see the history of present illness.    All 14 point review of systems negative except as described per history of present illness  EKGs/Labs/Other Studies Reviewed:      Recent Labs: 10/20/2019: ALT 17; Hemoglobin 14.7; Magnesium 2.0; Platelets 219 11/03/2019: TSH 1.030 12/08/2019: BUN 16; Creatinine, Ser 0.93; Potassium 3.8; Sodium 141  Recent Lipid  Panel    Component Value Date/Time   CHOL 144 09/15/2019 1649   TRIG 66 09/15/2019 1649   HDL 50 09/15/2019 1649   CHOLHDL 2.9 09/15/2019 1649   LDLCALC 81 09/15/2019 1649    Physical Exam:    VS:  BP 110/76 (BP Location: Left Arm, Patient Position: Sitting)    Pulse 74    Ht 5\' 5"  (1.651 m)    Wt 194 lb (88 kg)    SpO2 96%    BMI 32.28 kg/m     Wt Readings from Last 3 Encounters:  02/12/20 194 lb (88 kg)  02/11/20 195 lb 9.6 oz (88.7 kg)  02/10/20 191 lb (86.6 kg)  GEN:  Well nourished, well developed in no acute distress HEENT: Normal NECK: No JVD; No carotid bruits LYMPHATICS: No lymphadenopathy CARDIAC: RRR, no murmurs, no rubs, no gallops RESPIRATORY:  Clear to auscultation without rales, wheezing or rhonchi  ABDOMEN: Soft, non-tender, non-distended MUSCULOSKELETAL:  No edema; No deformity  SKIN: Warm and dry LOWER EXTREMITIES: no swelling NEUROLOGIC:  Alert and oriented x 3 PSYCHIATRIC:  Normal affect   ASSESSMENT:    1. Atypical chest pain   2. Abnormal stress test   3. Anxiety   4. Mixed hyperlipidemia   5. Primary hypertension    PLAN:    In order of problems listed above:  1. Atypical chest pain stress test positive but coronary CT angio normal.  Very low likelihood of significant coronary artery disease.  Scared to discontinue aspirin. 2. Anxiety follow-up antimedicine team. 3. Mixed dyslipidemia.  We will recheck her cholesterol today. 4. Essential hypertension blood pressure well controlled. 5. I did review note by neurology for this visit   Medication Adjustments/Labs and Tests Ordered: Current medicines are reviewed at length with the patient today.  Concerns regarding medicines are outlined above.  No orders of the defined types were placed in this encounter.  Medication changes: No orders of the defined types were placed in this encounter.   Signed, Park Liter, MD, [facility] 02/12/2020 3:36 PM    [place]

## 2020-02-12 NOTE — Patient Instructions (Signed)

## 2020-02-13 LAB — LIPID PANEL
Chol/HDL Ratio: 3.6 ratio (ref 0.0–4.4)
Cholesterol, Total: 164 mg/dL (ref 100–199)
HDL: 46 mg/dL (ref >39)
LDL Chol Calc (NIH): 106 mg/dL — ABNORMAL HIGH (ref 0–99)
Triglycerides: 61 mg/dL (ref 0–149)
VLDL Cholesterol Cal: 12 mg/dL (ref 5–40)

## 2020-02-14 ENCOUNTER — Encounter: Payer: Self-pay | Admitting: Cardiovascular Disease

## 2020-02-15 ENCOUNTER — Encounter: Payer: Self-pay | Admitting: Allergy and Immunology

## 2020-02-16 ENCOUNTER — Other Ambulatory Visit: Payer: Self-pay

## 2020-02-16 MED ORDER — ALBUTEROL SULFATE HFA 108 (90 BASE) MCG/ACT IN AERS: INHALATION_SPRAY | RESPIRATORY_TRACT | 1 refills | Status: DC

## 2020-03-01 ENCOUNTER — Ambulatory Visit (INDEPENDENT_AMBULATORY_CARE_PROVIDER_SITE_OTHER): Payer: 59 | Admitting: Physician Assistant

## 2020-03-01 ENCOUNTER — Other Ambulatory Visit: Payer: Self-pay

## 2020-03-01 ENCOUNTER — Encounter: Payer: Self-pay | Admitting: Physician Assistant

## 2020-03-01 VITALS — BP 122/80 | HR 76 | Temp 97.3°F | Ht 65.0 in | Wt 192.2 lb

## 2020-03-01 DIAGNOSIS — E782 Mixed hyperlipidemia: Secondary | ICD-10-CM | POA: Diagnosis not present

## 2020-03-01 DIAGNOSIS — Z23 Encounter for immunization: Secondary | ICD-10-CM

## 2020-03-01 DIAGNOSIS — I10 Essential (primary) hypertension: Secondary | ICD-10-CM | POA: Diagnosis not present

## 2020-03-01 HISTORY — DX: Encounter for immunization: Z23

## 2020-03-01 NOTE — Progress Notes (Signed)
New Patient Office Visit  Subjective:  Patient ID: Yolanda Sanchez, female    DOB: [birth year]  Age: 59 y.o. MRN: [id number]  CC:  Chief Complaint  Patient presents with  . Hypertension    14M follow up    HPI Yolanda Sanchez presents for hypertension  Pt states that she has had hypertension for over 3 years -she is currently taking cozaar 50mg  qd Denies chest pain/sob/edema  Pt with history of hyperlipidemia- she is currently taking lipitor 10mg  qd - voices no problems or concerns - is trying to watch diet - had recent lipid panel with cardiology which was normal  Pt with history of allergies - current meds include albuterol as needed - fluticasone and singulair   Past Medical History:  Diagnosis Date  . Abnormal stress test 11/10/2019  . Anxiety   . Asthma   . Atypical chest pain 09/15/2019  . Depression   . Gait abnormality 11/03/2019  . HSV infection    1 & 2   . Hypercholesterolemia 09/11/2018  . Hypertension   . Left facial numbness 10/25/2019  . Mixed hyperlipidemia 11/03/2019  . Obstructive sleep apnea 09/11/2018  . Palpitations 09/15/2019  . Paresthesia 11/03/2019  . Swelling of both lower extremities 09/11/2018    Past Surgical History:  Procedure Laterality Date  . BREAST BIOPSY Left   . CHOLECYSTECTOMY    . COLONOSCOPY    . TUBAL LIGATION      Family History  Problem Relation Age of Onset  . Hyperlipidemia Mother   . Hypertension Mother   . Depression Mother   . Hyperlipidemia Father   . Skin cancer Father   . Hypertension Brother   . Diabetes Brother   . Heart attack Maternal Grandmother   . Heart disease Maternal Grandmother   . Heart attack Maternal Grandfather   . Dementia Paternal Grandmother   . Colon cancer Paternal Grandfather   . Hypertension Brother   . Hyperlipidemia Brother   . Sleep apnea Brother   . Diabetes Brother   . Diabetes Son   . Heart failure Sister   . Atrial fibrillation Sister   . Allergic rhinitis Neg Hx   .  Angioedema Neg Hx   . Asthma Neg Hx   . Atopy Neg Hx   . Eczema Neg Hx   . Immunodeficiency Neg Hx   . Urticaria Neg Hx     Social History   Socioeconomic History  . Marital status: Divorced    Spouse name: Not on file  . Number of children: 2  . Years of education: Not on file  . Highest education level: Not on file  Occupational History  . Not on file  Tobacco Use  . Smoking status: Former Smoker    Quit date: 2019    Years since quitting: 2.9  . Smokeless tobacco: Never Used  . Tobacco comment: on and off for 20 years   Vaping Use  . Vaping Use: Former  Substance and Sexual Activity  . Alcohol use: Yes    Alcohol/week: 1.0 - 2.0 standard drink    Types: 1 - 2 Standard drinks or equivalent per week  . Drug use: Yes    Types: Marijuana    Comment: at night for insomnia sometimes   . Sexual activity: Not on file  Other Topics Concern  . Not on file  Social History Narrative   Lives alone    Right handed   Caffeine: 1 cup/day   Social Determinants  of Health   Financial Resource Strain: Not on file  Food Insecurity: Not on file  Transportation Needs: Not on file  Physical Activity: Not on file  Stress: Not on file  Social Connections: Not on file  Intimate Partner Violence: Not on file     Current Outpatient Medications:  .  albuterol (VENTOLIN HFA) 108 (90 Base) MCG/ACT inhaler, Can inhale two puffs every four to six hours as needed for cough or wheeze., Disp: 8 g, Rfl: 1 .  aspirin EC 81 MG tablet, Take 1 tablet (81 mg total) by mouth daily. Swallow whole., Disp: 90 tablet, Rfl: 3 .  buPROPion (WELLBUTRIN XL) 300 MG 24 hr tablet, Take 300 mg by mouth daily., Disp: , Rfl:  .  ciclesonide (ALVESCO) 160 MCG/ACT inhaler, Inhale 1 puff into the lungs 2 (two) times daily. with spacer, Disp: 6.1 g, Rfl: 5 .  fluticasone (FLONASE) 50 MCG/ACT nasal spray, Place 1 spray into both nostrils daily as needed for allergies or rhinitis., Disp: , Rfl:  .  losartan (COZAAR)  50 MG tablet, Take 1 tablet (50 mg total) by mouth daily., Disp: 30 tablet, Rfl: 3 .  meloxicam (MOBIC) 7.5 MG tablet, Take 7.5 mg by mouth daily. As needed., Disp: , Rfl:  .  metroNIDAZOLE (METROCREAM) 0.75 % cream, Apply topically 2 (two) times daily., Disp: 45 g, Rfl: 5 .  montelukast (SINGULAIR) 10 MG tablet, Take 1 tablet (10 mg total) by mouth at bedtime., Disp: 30 tablet, Rfl: 5 .  valACYclovir (VALTREX) 1000 MG tablet, Take 1 tablet (1,000 mg total) by mouth daily., Disp: 30 tablet, Rfl: 5 .  nitroGLYCERIN (NITROSTAT) 0.4 MG SL tablet, Place 1 tablet (0.4 mg total) under the tongue every 5 (five) minutes as needed., Disp: 25 tablet, Rfl: 6   Allergies  Allergen Reactions  . Ranexa [Ranolazine]     10-20-19    ROS CONSTITUTIONAL: Negative for chills, fatigue, fever, unintentional weight gain and unintentional weight loss.  CARDIOVASCULAR: Negative for chest pain, dizziness, palpitations and pedal edema.  RESPIRATORY: Negative for recent cough and dyspnea.   PSYCHIATRIC: Negative for sleep disturbance and to question depression screen.  Negative for depression, negative for anhedonia.         Objective:    PHYSICAL EXAM:   VS: BP 122/80 (BP Location: Left Arm, Patient Position: Sitting, Cuff Size: Normal)   Pulse 76   Temp (!) 97.3 F (36.3 C) (Temporal)   Ht 5\' 5"  (1.651 m)   Wt 192 lb 3.2 oz (87.2 kg)   SpO2 97%   BMI 31.98 kg/m   GEN: Well nourished, well developed, in no acute distress  Cardiac: RRR; no murmurs, rubs, or gallops,no edema -  Respiratory:  normal respiratory rate and pattern with no distress - normal breath sounds with no rales, rhonchi, wheezes or rubs Skin: warm and dry, no rash  Neuro:  Alert and Oriented x 3,  -  Psych: euthymic mood, appropriate affect and demeanor    BP 122/80 (BP Location: Left Arm, Patient Position: Sitting, Cuff Size: Normal)   Pulse 76   Temp (!) 97.3 F (36.3 C) (Temporal)   Ht 5\' 5"  (1.651 m)   Wt 192 lb 3.2 oz  (87.2 kg)   SpO2 97%   BMI 31.98 kg/m  Wt Readings from Last 3 Encounters:  03/01/20 192 lb 3.2 oz (87.2 kg)  02/12/20 194 lb (88 kg)  02/11/20 195 lb 9.6 oz (88.7 kg)     Health Maintenance Due  Topic Date Due  . Hepatitis C Screening  Never done  . HIV Screening  Never done  . COLONOSCOPY  Never done  . PAP SMEAR-Modifier  06/30/2017  . INFLUENZA VACCINE  10/11/2019    There are no preventive care reminders to display for this patient.  Lab Results  Component Value Date   TSH 1.030 11/03/2019   Lab Results  Component Value Date   WBC 6.3 10/20/2019   HGB 14.7 10/20/2019   HCT 44.0 10/20/2019   MCV 93.4 10/20/2019   PLT 219 10/20/2019   Lab Results  Component Value Date   NA 141 12/08/2019   K 3.8 12/08/2019   CO2 25 12/08/2019   GLUCOSE 84 12/08/2019   BUN 16 12/08/2019   CREATININE 0.93 12/08/2019   BILITOT 0.2 (L) 10/20/2019   ALKPHOS 55 10/20/2019   AST 19 10/20/2019   ALT 17 10/20/2019   PROT 7.9 10/20/2019   ALBUMIN 4.5 10/20/2019   CALCIUM 9.4 12/08/2019   ANIONGAP 7 10/20/2019   Lab Results  Component Value Date   CHOL 164 02/12/2020   Lab Results  Component Value Date   HDL 46 02/12/2020   Lab Results  Component Value Date   LDLCALC 106 (H) 02/12/2020   Lab Results  Component Value Date   TRIG 61 02/12/2020   Lab Results  Component Value Date   CHOLHDL 3.6 02/12/2020   No results found for: HGBA1C    Assessment & Plan:   Problem List Items Addressed This Visit      Cardiovascular and Mediastinum   Hypertension   Relevant Orders   Comprehensive metabolic panel Continue current meds    Other Visit Diagnoses    Need for prophylactic vaccination and inoculation against influenza    -  Primary   Relevant Orders   Flu Vaccine QUAD 6+ mos PF IM (Fluarix Quad PF)   Need for tetanus, diphtheria, and acellular pertussis (Tdap) vaccine       Relevant Orders   Tdap vaccine greater than or equal to 7yo IM      No orders of the  defined types were placed in this encounter.   Follow-up: Return in about 6 months (around 08/30/2020) for chronic fasting.    SARA R Sandrea Boer, PA-C

## 2020-03-02 LAB — COMPREHENSIVE METABOLIC PANEL
ALT: 14 IU/L (ref 0–32)
AST: 15 IU/L (ref 0–40)
Albumin/Globulin Ratio: 1.7 (ref 1.2–2.2)
Albumin: 4.3 g/dL (ref 3.8–4.9)
Alkaline Phosphatase: 62 IU/L (ref 44–121)
BUN/Creatinine Ratio: 22 (ref 9–23)
BUN: 17 mg/dL (ref 6–24)
Bilirubin Total: 0.3 mg/dL (ref 0.0–1.2)
CO2: 25 mmol/L (ref 20–29)
Calcium: 9.5 mg/dL (ref 8.7–10.2)
Chloride: 106 mmol/L (ref 96–106)
Creatinine, Ser: 0.79 mg/dL (ref 0.57–1.00)
GFR calc Af Amer: 96 mL/min/{1.73_m2} (ref >59)
GFR calc non Af Amer: 83 mL/min/{1.73_m2} (ref >59)
Globulin, Total: 2.6 g/dL (ref 1.5–4.5)
Glucose: 90 mg/dL (ref 65–99)
Potassium: 4 mmol/L (ref 3.5–5.2)
Sodium: 142 mmol/L (ref 134–144)
Total Protein: 6.9 g/dL (ref 6.0–8.5)

## 2020-03-10 ENCOUNTER — Encounter: Payer: Self-pay | Admitting: Allergy and Immunology

## 2020-03-10 ENCOUNTER — Ambulatory Visit (INDEPENDENT_AMBULATORY_CARE_PROVIDER_SITE_OTHER): Payer: 59 | Admitting: Allergy and Immunology

## 2020-03-10 ENCOUNTER — Other Ambulatory Visit: Payer: Self-pay

## 2020-03-10 VITALS — BP 136/82 | HR 70 | Resp 16

## 2020-03-10 DIAGNOSIS — L719 Rosacea, unspecified: Secondary | ICD-10-CM | POA: Diagnosis not present

## 2020-03-10 DIAGNOSIS — J3089 Other allergic rhinitis: Secondary | ICD-10-CM

## 2020-03-10 DIAGNOSIS — J4530 Mild persistent asthma, uncomplicated: Secondary | ICD-10-CM

## 2020-03-10 DIAGNOSIS — K219 Gastro-esophageal reflux disease without esophagitis: Secondary | ICD-10-CM

## 2020-03-10 MED ORDER — OMEPRAZOLE 40 MG PO CPDR: 40.0000 mg | DELAYED_RELEASE_CAPSULE | Freq: Every morning | ORAL | 5 refills | Status: DC

## 2020-03-10 MED ORDER — FAMOTIDINE 40 MG PO TABS: 40.0000 mg | ORAL_TABLET | Freq: Every evening | ORAL | 5 refills | Status: DC

## 2020-03-10 NOTE — Progress Notes (Signed)
[place] [facility] [place] [facility] [place] [facility] [place] [facility] [place]   Follow-up Note  Referring Provider: Marianne Sofia, PA-C Primary Provider: Marianne Sofia, Cordelia Poche Date of Office Visit: 03/10/2020  Subjective:   Paticia Sanchez (DOB: 1960-04-24) is a 59 y.o. female who [facility] in re-evaluation of the following:  HPI: [name] returns to this clinic in evaluation of a combination of respiratory tract symptoms presently being evaluated as asthma and rhinitis and possible LPR.  Her last visit to this clinic was her initial evaluation of 11 February 2020 at which point in time we removed her ACE inhibitor and treated her for inflammation of both her upper and lower airway and as well addressed the issue of rosacea.  She still has lots of postnasal drip and throat clearing.  Fortunately, she is not developing any of her severe coughing associated with posttussive urination.  She ran out of her Alvesco the past several days.  She continues to use a nasal steroid and a leukotriene modifier.  The skin on her face is really doing a lot better at this point in time.    Allergies as of 03/10/2020      Reactions   Ranexa [ranolazine]    10-20-19      Medication List      albuterol 108 (90 Base) MCG/ACT inhaler Commonly known as: VENTOLIN HFA Can inhale two puffs every four to six hours as needed for cough or wheeze.   Alvesco 160 MCG/ACT inhaler Generic drug: ciclesonide Inhale 1 puff into the lungs 2 (two) times daily. with spacer   aspirin EC 81 MG tablet Take 1 tablet (81 mg total) by mouth daily. Swallow whole.   buPROPion 300 MG 24 hr tablet Commonly known as: WELLBUTRIN XL Take 300 mg by mouth daily.   fluticasone 50 MCG/ACT nasal spray Commonly known as: FLONASE Place 1 spray into both nostrils daily as needed for allergies or rhinitis.   losartan 50 MG tablet Commonly known as: Cozaar Take 1 tablet (50 mg total) by mouth daily.    meloxicam 7.5 MG tablet Commonly known as: MOBIC Take 7.5 mg by mouth daily. As needed.   metroNIDAZOLE 0.75 % cream Commonly known as: METROCREAM Apply topically 2 (two) times daily.   montelukast 10 MG tablet Commonly known as: SINGULAIR Take 1 tablet (10 mg total) by mouth at bedtime.   nitroGLYCERIN 0.4 MG SL tablet Commonly known as: NITROSTAT Place 1 tablet (0.4 mg total) under the tongue every 5 (five) minutes as needed.   valACYclovir 1000 MG tablet Commonly known as: VALTREX Take 1 tablet (1,000 mg total) by mouth daily.       Past Medical History:  Diagnosis Date  . Abnormal stress test 11/10/2019  . Anxiety   . Asthma   . Atypical chest pain 09/15/2019  . Depression   . Gait abnormality 11/03/2019  . HSV infection    1 & 2   . Hypercholesterolemia 09/11/2018  . Hypertension   . Left facial numbness 10/25/2019  . Mixed hyperlipidemia 11/03/2019  . Obstructive sleep apnea 09/11/2018  . Palpitations 09/15/2019  . Paresthesia 11/03/2019  . Swelling of both lower extremities 09/11/2018    Past Surgical History:  Procedure Laterality Date  . BREAST BIOPSY Left   . CHOLECYSTECTOMY    . COLONOSCOPY    . TUBAL LIGATION      Review of systems negative except as noted in HPI / PMHx or noted below:  Review of Systems  Constitutional: Negative.   HENT: Negative.   Eyes: Negative.   Respiratory: Negative.   Cardiovascular: Negative.   Gastrointestinal: Negative.   Genitourinary: Negative.   Musculoskeletal: Negative.   Skin: Negative.   Neurological: Negative.   Endo/Heme/Allergies: Negative.   Psychiatric/Behavioral: Negative.      Objective:   Vitals:   03/10/20 1520  BP: 136/82  Pulse: 70  Resp: 16  SpO2: 97%          Physical Exam Constitutional:      Appearance: She is not diaphoretic.  HENT:     Head: Normocephalic.     Right Ear: Tympanic membrane, ear canal and external ear normal.     Left Ear: Tympanic membrane, ear canal and external  ear normal.     Nose: Nose normal. No mucosal edema or rhinorrhea.     Mouth/Throat:     Mouth: Oropharynx is clear and moist and mucous membranes are normal.     Pharynx: Uvula midline. No oropharyngeal exudate.  Eyes:     Conjunctiva/sclera: Conjunctivae normal.  Neck:     Thyroid: No thyromegaly.     Trachea: Trachea normal. No tracheal tenderness or tracheal deviation.  Cardiovascular:     Rate and Rhythm: Normal rate and regular rhythm.     Heart sounds: Normal heart sounds, S1 normal and S2 normal. No murmur heard.   Pulmonary:     Effort: No respiratory distress.     Breath sounds: Normal breath sounds. No stridor. No wheezing or rales.  Musculoskeletal:        General: No edema.  Lymphadenopathy:     Head:     Right side of head: No tonsillar adenopathy.     Left side of head: No tonsillar adenopathy.     Cervical: No cervical adenopathy.  Skin:    Findings: No erythema or rash (Facial erythema).     Nails: There is no clubbing.  Neurological:     Mental Status: She is alert.     Diagnostics:    Spirometry was performed and demonstrated an FEV1 of 2.68 at 98% of predicted.  Assessment and Plan:   1. Asthma, well controlled, mild persistent   2. Perennial allergic rhinitis   3. Rosacea   4. LPRD (laryngopharyngeal reflux disease)     1. Continue to Treat and prevent inflammation:   A. Flonase - 1 spray each nostril 2 times per day  B. Montelukast 10 mg - 1 tablet 1 time per day  C. Discontinue Alvesco  2. Treat and prevent reflux / LPR:   A. Minimize all forms of caffeiene  B. Omeprazole 40 mg - 1 tablet in AM  C. Famotidine 40 mg - 1 tablet in PM  3. If needed:   A. Albuterol HFA - 2 inhalations every 4-6 hours  4. Treat and prevent rosacea:   A. Metrocream applied to face 2 times per day  5. Return to clinic in 8 weeks or earlier if problem  Kerly appears to have continued problems with throat clearing and postnasal drip and given her  previous history of reflux she probably has LPR and we will treat her empirically for this condition with the therapy noted above.  She will remain on some anti-inflammatory agents for her airway but I am going to decrease her Alvesco as this did not really help her over the course of the past month.  She will continue to treat inflammation of her skin with MetroCream.  I will see her back in his clinic in 8 weeks or earlier if there is a problem.  Allena Katz, MD Allergy / Immunology [place]

## 2020-03-10 NOTE — Patient Instructions (Addendum)
  1. Continue to Treat and prevent inflammation:   A. Flonase - 1 spray each nostril 2 times per day  B. Montelukast 10 mg - 1 tablet 1 time per day  C. Discontinue Alvesco  2. Treat and prevent reflux / LPR:   A. Minimize all forms of caffeiene  B. Omeprazole 40 mg - 1 tablet in AM  C. Famotidine 40 mg - 1 tablet in PM  3. If needed:   A. Albuterol HFA - 2 inhalations every 4-6 hours  4. Treat and prevent rosacea:   A. Metrocream applied to face 2 times per day  5. Return to clinic in 8 weeks or earlier if problem

## 2020-03-14 ENCOUNTER — Encounter: Payer: Self-pay | Admitting: Allergy and Immunology

## 2020-04-13 MED ORDER — LISINOPRIL-HYDROCHLOROTHIAZIDE 10-12.5 MG PO TABS
1.0000 | ORAL_TABLET | Freq: Every day | ORAL | 1 refills | Status: DC
Start: 2020-04-13 — End: 2020-09-27

## 2020-04-13 NOTE — Addendum Note (Signed)
Addended by: Senaida Ores on: 04/13/2020 08:59 AM   Modules accepted: Orders

## 2020-04-26 ENCOUNTER — Encounter: Payer: Self-pay | Admitting: Physician Assistant

## 2020-04-27 ENCOUNTER — Other Ambulatory Visit: Payer: Self-pay

## 2020-04-27 MED ORDER — BUPROPION HCL ER (XL) 300 MG PO TB24: 300.0000 mg | ORAL_TABLET | Freq: Every day | ORAL | 0 refills | Status: DC

## 2020-04-27 MED ORDER — FLUTICASONE PROPIONATE 50 MCG/ACT NA SUSP: 2.0000 | Freq: Every day | NASAL | 3 refills | Status: DC | PRN

## 2020-05-06 ENCOUNTER — Other Ambulatory Visit: Payer: Self-pay | Admitting: Physician Assistant

## 2020-05-12 ENCOUNTER — Encounter: Payer: Self-pay | Admitting: Allergy and Immunology

## 2020-05-12 ENCOUNTER — Ambulatory Visit (INDEPENDENT_AMBULATORY_CARE_PROVIDER_SITE_OTHER): Payer: 59 | Admitting: Allergy and Immunology

## 2020-05-12 ENCOUNTER — Other Ambulatory Visit: Payer: Self-pay

## 2020-05-12 VITALS — BP 130/82 | HR 85 | Resp 16 | Ht 65.0 in | Wt 181.0 lb

## 2020-05-12 DIAGNOSIS — K219 Gastro-esophageal reflux disease without esophagitis: Secondary | ICD-10-CM | POA: Diagnosis not present

## 2020-05-12 DIAGNOSIS — L719 Rosacea, unspecified: Secondary | ICD-10-CM | POA: Diagnosis not present

## 2020-05-12 DIAGNOSIS — J4530 Mild persistent asthma, uncomplicated: Secondary | ICD-10-CM | POA: Diagnosis not present

## 2020-05-12 DIAGNOSIS — J3089 Other allergic rhinitis: Secondary | ICD-10-CM

## 2020-05-12 MED ORDER — DOXYCYCLINE HYCLATE 50 MG PO CAPS: 50.0000 mg | ORAL_CAPSULE | Freq: Every day | ORAL | 5 refills | Status: DC

## 2020-05-12 NOTE — Progress Notes (Signed)
[place]   Follow-up Note  Referring Provider: Marge Duncans, PA-C Primary Provider: Marge Duncans, Hershal Coria Date of Office Visit: 05/12/2020  Subjective:   Yolanda Sanchez (DOB: 17-Jul-1960) is a 60 y.o. female who returns to [facility] on 05/12/2020 in re-evaluation of the following:  HPI: Yolanda Sanchez returns to this clinic in reevaluation of of mild asthma and rhinitis and LPR and rosacea.  Her last visit to this clinic was 10 March 2020.  She has been consistently treating her LPR with a combination of a proton pump inhibitor and H2 receptor blocker and she has not redeveloped her cough and she has not had any significant issues with her throat.  Almost all of the issues with postnasal drip and throat clearing have abated.  She was able to restart her lisinopril and this use of an ACE inhibitor did not reprecipitate any cough.  During her last visit we tapered her off an inhaled steroid and she continued to do well and rarely uses a short acting bronchodilator.  She has been intermittently using her montelukast at this point.  Her nose is doing well while using Flonase.  She still has some redness of her skin even while using her MetroCream.  Allergies as of 05/12/2020      Reactions   Ranexa [ranolazine]    10-20-19      Medication List    albuterol 108 (90 Base) MCG/ACT inhaler Commonly known as: VENTOLIN HFA Can inhale two puffs every four to six hours as needed for cough or wheeze.   aspirin EC 81 MG tablet Take 1 tablet (81 mg total) by mouth daily. Swallow whole.   buPROPion 300 MG 24 hr tablet Commonly known as: WELLBUTRIN XL Take 1 tablet (300 mg total) by mouth daily.   famotidine 40 MG tablet Commonly known as: PEPCID Take 1 tablet (40 mg total) by mouth every evening.   fluticasone 50 MCG/ACT nasal spray Commonly known as: FLONASE Place 2 sprays into both nostrils daily as needed for  allergies or rhinitis.   lisinopril-hydrochlorothiazide 10-12.5 MG tablet Commonly known as: Zestoretic Take 1 tablet by mouth daily.   losartan 50 MG tablet Commonly known as: Cozaar Take 1 tablet (50 mg total) by mouth daily.   meloxicam 7.5 MG tablet Commonly known as: MOBIC Take 7.5 mg by mouth daily. As needed.   metroNIDAZOLE 0.75 % cream Commonly known as: METROCREAM Apply topically 2 (two) times daily.   montelukast 10 MG tablet Commonly known as: SINGULAIR Take 1 tablet (10 mg total) by mouth at bedtime.   nitroGLYCERIN 0.4 MG SL tablet Commonly known as: NITROSTAT Place 1 tablet (0.4 mg total) under the tongue every 5 (five) minutes as needed.   omeprazole 40 MG capsule Commonly known as: PRILOSEC Take 1 capsule (40 mg total) by mouth in the morning.   valACYclovir 1000 MG tablet Commonly known as: VALTREX TAKE 1 TABLET BY MOUTH DAILY       Past Medical History:  Diagnosis Date  . Abnormal stress test 11/10/2019  . Anxiety   . Asthma   . Atypical chest pain 09/15/2019  . Depression   . Gait abnormality 11/03/2019  . HSV infection    1 & 2   . Hypercholesterolemia 09/11/2018  . Hypertension   . Left facial numbness 10/25/2019  . Mixed hyperlipidemia 11/03/2019  . Obstructive sleep apnea 09/11/2018  . Palpitations 09/15/2019  . Paresthesia 11/03/2019  .  Swelling of both lower extremities 09/11/2018    Past Surgical History:  Procedure Laterality Date  . BREAST BIOPSY Left   . CHOLECYSTECTOMY    . COLONOSCOPY    . TUBAL LIGATION      Review of systems negative except as noted in HPI / PMHx or noted below:  Review of Systems  Constitutional: Negative.   HENT: Negative.   Eyes: Negative.   Respiratory: Negative.   Cardiovascular: Negative.   Gastrointestinal: Negative.   Genitourinary: Negative.   Musculoskeletal: Negative.   Skin: Negative.   Neurological: Negative.   Endo/Heme/Allergies: Negative.   Psychiatric/Behavioral: Negative.       Objective:   Vitals:   05/12/20 1632  BP: 130/82  Pulse: 85  Resp: 16  SpO2: 94%   Height: 5\' 5"  (165.1 cm)  Weight: 181 lb (82.1 kg)   Physical Exam Constitutional:      Appearance: She is not diaphoretic.  HENT:     Head: Normocephalic.     Right Ear: Tympanic membrane, ear canal and external ear normal.     Left Ear: Tympanic membrane, ear canal and external ear normal.     Nose: Nose normal. No mucosal edema or rhinorrhea.     Mouth/Throat:     Mouth: Oropharynx is clear and moist and mucous membranes are normal.     Pharynx: Uvula midline. No oropharyngeal exudate.  Eyes:     Conjunctiva/sclera: Conjunctivae normal.  Neck:     Thyroid: No thyromegaly.     Trachea: Trachea normal. No tracheal tenderness or tracheal deviation.  Cardiovascular:     Rate and Rhythm: Normal rate and regular rhythm.     Heart sounds: Normal heart sounds, S1 normal and S2 normal. No murmur heard.   Pulmonary:     Effort: No respiratory distress.     Breath sounds: Normal breath sounds. No stridor. No wheezing or rales.  Musculoskeletal:        General: No edema.  Lymphadenopathy:     Head:     Right side of head: No tonsillar adenopathy.     Left side of head: No tonsillar adenopathy.     Cervical: No cervical adenopathy.  Skin:    Findings: No erythema or rash (Facial erythema with cystic lesions).     Nails: There is no clubbing.  Neurological:     Mental Status: She is alert.     Diagnostics: none  Assessment and Plan:   1. Asthma, well controlled, mild persistent   2. Perennial allergic rhinitis   3. Rosacea   4. LPRD (laryngopharyngeal reflux disease)     1. Continue to Treat and prevent inflammation:   A. Flonase - 1 spray each nostril 1-2 times per day  B. Discontinue Montelukast    2. Treat and prevent reflux / LPR:   A. Minimize all forms of caffeiene  B. Omeprazole 40 mg - 1 tablet in AM  C. Famotidine 40 mg - 1 tablet in PM  3. If needed:   A.  Albuterol HFA - 2 inhalations every 4-6 hours  4. Treat and prevent rosacea:   A.  Metrocream applied to face 2 times per day  B.  Doxycycline 50 mg - 1 time per day (sun sensitivity???)  5. Return to clinic in 6 months or earlier if problem  We will have Yolanda Sanchez increase her therapy for rosacea by using doxycycline.  I did talk to her about possible sun sensitivity that can sometimes occur with the use  of this medication.  If she still has problems with her skin in the face of this treatment we will refer her onto [facility] dermatology.  All of her other issues are under excellent control.  I think there is an opportunity to further consolidate her medical treatment by discontinuing her montelukast.  She will remain on pretty aggressive therapy for reflux for the next 6 months.  I will see her back in this clinic at that point in time or earlier if there is a problem.  Allena Katz, MD Allergy / Immunology [place]

## 2020-05-12 NOTE — Patient Instructions (Addendum)
°  1. Continue to Treat and prevent inflammation:   A. Flonase - 1 spray each nostril 1-2 times per day  B. Discontinue Montelukast    2. Treat and prevent reflux / LPR:   A. Minimize all forms of caffeiene  B. Omeprazole 40 mg - 1 tablet in AM  C. Famotidine 40 mg - 1 tablet in PM  3. If needed:   A. Albuterol HFA - 2 inhalations every 4-6 hours  4. Treat and prevent rosacea:   A.  Metrocream applied to face 2 times per day  B.  Doxycycline 50 mg - 1 time per day (sun sensitivity???)  5. Return to clinic in 6 months or earlier if problem

## 2020-05-13 ENCOUNTER — Encounter: Payer: Self-pay | Admitting: Allergy and Immunology

## 2020-06-02 ENCOUNTER — Other Ambulatory Visit: Payer: Self-pay | Admitting: Allergy and Immunology

## 2020-06-22 ENCOUNTER — Other Ambulatory Visit: Payer: Self-pay | Admitting: Physician Assistant

## 2020-07-28 ENCOUNTER — Other Ambulatory Visit: Payer: Self-pay | Admitting: Allergy and Immunology

## 2020-08-10 DIAGNOSIS — J45909 Unspecified asthma, uncomplicated: Secondary | ICD-10-CM | POA: Insufficient documentation

## 2020-08-12 ENCOUNTER — Encounter: Payer: Self-pay | Admitting: Cardiology

## 2020-08-12 ENCOUNTER — Other Ambulatory Visit: Payer: Self-pay

## 2020-08-12 ENCOUNTER — Ambulatory Visit: Payer: 59 | Admitting: Cardiology

## 2020-08-30 ENCOUNTER — Encounter: Payer: Self-pay | Admitting: Physician Assistant

## 2020-08-30 ENCOUNTER — Ambulatory Visit: Payer: 59 | Admitting: Physician Assistant

## 2020-08-30 ENCOUNTER — Other Ambulatory Visit: Payer: Self-pay

## 2020-08-30 ENCOUNTER — Other Ambulatory Visit: Payer: Self-pay | Admitting: Physician Assistant

## 2020-08-30 ENCOUNTER — Other Ambulatory Visit: Payer: Self-pay | Admitting: Allergy and Immunology

## 2020-08-30 ENCOUNTER — Ambulatory Visit (INDEPENDENT_AMBULATORY_CARE_PROVIDER_SITE_OTHER): Payer: 59

## 2020-08-30 VITALS — BP 112/68 | HR 67 | Temp 97.5°F | Ht 65.0 in | Wt 173.0 lb

## 2020-08-30 DIAGNOSIS — Z23 Encounter for immunization: Secondary | ICD-10-CM

## 2020-08-30 DIAGNOSIS — Z113 Encounter for screening for infections with a predominantly sexual mode of transmission: Secondary | ICD-10-CM

## 2020-08-30 DIAGNOSIS — E782 Mixed hyperlipidemia: Secondary | ICD-10-CM

## 2020-08-30 DIAGNOSIS — T300 Burn of unspecified body region, unspecified degree: Secondary | ICD-10-CM | POA: Diagnosis not present

## 2020-08-30 DIAGNOSIS — I10 Essential (primary) hypertension: Secondary | ICD-10-CM | POA: Diagnosis not present

## 2020-08-30 DIAGNOSIS — N6310 Unspecified lump in the right breast, unspecified quadrant: Secondary | ICD-10-CM | POA: Diagnosis not present

## 2020-08-30 MED ORDER — MUPIROCIN 2 % EX OINT: 1.0000 "application " | TOPICAL_OINTMENT | Freq: Two times a day (BID) | CUTANEOUS | 0 refills | Status: DC
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
## 2020-08-30 NOTE — Progress Notes (Signed)
   Covid-19 Vaccination Clinic  Name:  Birdell Frasier    MRN: [id number] DOB: 08-19-1960  08/30/2020  Ms. Heatherington was observed post Covid-19 immunization for 15 minutes without incident. She was provided with Vaccine Information Sheet and instruction to access the V-Safe system.   Ms. Sistare was instructed to call 911 with any severe reactions post vaccine: Difficulty breathing  Swelling of face and throat  A fast heartbeat  A bad rash all over body  Dizziness and weakness

## 2020-08-31 ENCOUNTER — Ambulatory Visit (INDEPENDENT_AMBULATORY_CARE_PROVIDER_SITE_OTHER): Payer: 59 | Admitting: Cardiology

## 2020-08-31 ENCOUNTER — Ambulatory Visit: Payer: 59 | Admitting: Cardiology

## 2020-08-31 ENCOUNTER — Encounter: Payer: Self-pay | Admitting: Cardiology

## 2020-08-31 VITALS — BP 118/68 | HR 78 | Ht 65.0 in | Wt 171.2 lb

## 2020-08-31 DIAGNOSIS — G4733 Obstructive sleep apnea (adult) (pediatric): Secondary | ICD-10-CM | POA: Diagnosis not present

## 2020-08-31 DIAGNOSIS — R002 Palpitations: Secondary | ICD-10-CM

## 2020-08-31 DIAGNOSIS — I10 Essential (primary) hypertension: Secondary | ICD-10-CM

## 2020-08-31 DIAGNOSIS — E7800 Pure hypercholesterolemia, unspecified: Secondary | ICD-10-CM

## 2020-08-31 DIAGNOSIS — R9439 Abnormal result of other cardiovascular function study: Secondary | ICD-10-CM | POA: Diagnosis not present

## 2020-08-31 DIAGNOSIS — R0789 Other chest pain: Secondary | ICD-10-CM

## 2020-08-31 LAB — CARDIOVASCULAR RISK ASSESSMENT

## 2020-08-31 LAB — CBC WITH DIFFERENTIAL/PLATELET
Basophils Absolute: 0 10*3/uL (ref 0.0–0.2)
Basos: 1 %
EOS (ABSOLUTE): 0.1 10*3/uL (ref 0.0–0.4)
Eos: 1 %
Hematocrit: 40.2 % (ref 34.0–46.6)
Hemoglobin: 13.6 g/dL (ref 11.1–15.9)
Immature Grans (Abs): 0 10*3/uL (ref 0.0–0.1)
Immature Granulocytes: 0 %
Lymphocytes Absolute: 2.1 10*3/uL (ref 0.7–3.1)
Lymphs: 30 %
MCH: 32.4 pg (ref 26.6–33.0)
MCHC: 33.8 g/dL (ref 31.5–35.7)
MCV: 96 fL (ref 79–97)
Monocytes Absolute: 0.3 10*3/uL (ref 0.1–0.9)
Monocytes: 4 %
Neutrophils Absolute: 4.5 10*3/uL (ref 1.4–7.0)
Neutrophils: 64 %
Platelets: 198 10*3/uL (ref 150–450)
RBC: 4.2 x10E6/uL (ref 3.77–5.28)
RDW: 13 % (ref 11.7–15.4)
WBC: 7.1 10*3/uL (ref 3.4–10.8)

## 2020-08-31 LAB — COMPREHENSIVE METABOLIC PANEL
ALT: 17 IU/L (ref 0–32)
AST: 17 IU/L (ref 0–40)
Albumin/Globulin Ratio: 1.6 (ref 1.2–2.2)
Albumin: 4.1 g/dL (ref 3.8–4.9)
Alkaline Phosphatase: 59 IU/L (ref 44–121)
BUN/Creatinine Ratio: 27 — ABNORMAL HIGH (ref 9–23)
BUN: 24 mg/dL (ref 6–24)
Bilirubin Total: 0.2 mg/dL (ref 0.0–1.2)
CO2: 25 mmol/L (ref 20–29)
Calcium: 9.4 mg/dL (ref 8.7–10.2)
Chloride: 106 mmol/L (ref 96–106)
Creatinine, Ser: 0.9 mg/dL (ref 0.57–1.00)
Globulin, Total: 2.5 g/dL (ref 1.5–4.5)
Glucose: 74 mg/dL (ref 65–99)
Potassium: 4.4 mmol/L (ref 3.5–5.2)
Sodium: 143 mmol/L (ref 134–144)
Total Protein: 6.6 g/dL (ref 6.0–8.5)
eGFR: 74 mL/min/{1.73_m2} (ref >59)

## 2020-08-31 LAB — TSH: TSH: 1.23 u[IU]/mL (ref 0.450–4.500)

## 2020-08-31 LAB — LIPID PANEL
Chol/HDL Ratio: 3 ratio (ref 0.0–4.4)
Cholesterol, Total: 134 mg/dL (ref 100–199)
HDL: 44 mg/dL (ref >39)
LDL Chol Calc (NIH): 78 mg/dL (ref 0–99)
Triglycerides: 56 mg/dL (ref 0–149)
VLDL Cholesterol Cal: 12 mg/dL (ref 5–40)

## 2020-08-31 LAB — HEPATITIS C ANTIBODY: Hep C Virus Ab: <0.1 s/co ratio (ref 0.0–0.9)

## 2020-08-31 LAB — HIV ANTIBODY (ROUTINE TESTING W REFLEX): HIV Screen 4th Generation wRfx: NONREACTIVE

## 2020-08-31 NOTE — Patient Instructions (Signed)
Medication Instructions:  Your physician recommends that you continue on your current medications as directed. Please refer to the Current Medication list given to you today.  *If you need a refill on your cardiac medications before your next appointment, please call your pharmacy*   Lab Work:  If you have labs (blood work) drawn today and your tests are completely normal, you will receive your results only by: [place] (if you have MyChart) OR A paper copy in the mail If you have any lab test that is abnormal or we need to change your treatment, we will call you to review the results.   Testing/Procedures:    Follow-Up: At [facility], you and your health needs are our priority.  As part of our continuing mission to provide you with exceptional heart care, we have created designated Provider Care Teams.  These Care Teams include your primary Cardiologist (physician) and Advanced Practice Providers (APPs -  Physician Assistants and Nurse Practitioners) who all work together to provide you with the care you need, when you need it.  We recommend signing up for the patient portal called "MyChart".  Sign up information is provided on this After Visit Summary.  MyChart is used to connect with patients for Virtual Visits (Telemedicine).  Patients are able to view lab/test results, encounter notes, upcoming appointments, etc.  Non-urgent messages can be sent to your provider as well.   To learn more about what you can do with MyChart, go to [email or web address].    Your next appointment:   6 month(s)  The format for your next appointment:   In Person  Provider:   Jenne Campus, MD   Other Instructions

## 2020-08-31 NOTE — Progress Notes (Signed)
Cardiology Office Note:    Date:  08/31/2020   ID:  Lashayla, Armes 02/20/1961, MRN [id number]  PCP:  Marge Duncans, PA-C  Cardiologist:  Jenne Campus, MD    Referring MD: Marge Duncans, PA-C   No chief complaint on file. I am doing great  History of Present Illness:    Yolanda Sanchez is a 60 y.o. female who was referred originally to [place] because of atypical chest pain.  Stress test was done which was abnormal after that she did not want to proceed with cardiac catheterization coronary CT angio being done coronary CT angio was perfectly normal.  She also had history of hypertension dyslipidemia morbid obesity obstructive sleep apnea.  However, she started changing her habits meaning a change her eating habits and she lost 90 pounds.  Since that time she is feeling dramatically better.  Her blood pressure seems to be well controlled, I did review her cholesterol which LDL is 78 right now without any medications.  Sleep apnea improve dramatically.  She is doing very well.  Still complain of having some chest pain which is atypical lasting only for split-second on the left side of her chest not related to exercise.  Past Medical History:  Diagnosis Date   Abnormal stress test 11/10/2019   Anxiety    Asthma    Atypical chest pain 09/15/2019   Chest tightness 11/26/2019   Depression    Gait abnormality 11/03/2019   HSV infection    1 & 2    Hypercholesterolemia 09/11/2018   Hypertension    Left facial numbness 10/25/2019   Mixed hyperlipidemia 11/03/2019   Need for prophylactic vaccination and inoculation against influenza 03/01/2020   Need for tetanus, diphtheria, and acellular pertussis (Tdap) vaccine 03/01/2020   Obstructive sleep apnea 09/11/2018   Palpitations 09/15/2019   Paresthesia 11/03/2019   Swelling of both lower extremities 09/11/2018    Past Surgical History:  Procedure Laterality Date   BREAST BIOPSY Left    CHOLECYSTECTOMY     COLONOSCOPY     TUBAL LIGATION       Current Medications: Current Meds  Medication Sig   aspirin EC 81 MG tablet Take 1 tablet (81 mg total) by mouth daily. Swallow whole.   buPROPion (WELLBUTRIN XL) 300 MG 24 hr tablet TAKE 1 TABLET BY MOUTH DAILY (Patient taking differently: Take 300 mg by mouth daily.)   doxycycline (VIBRAMYCIN) 50 MG capsule Take 1 capsule (50 mg total) by mouth daily.   famotidine (PEPCID) 40 MG tablet Take 1 tablet (40 mg total) by mouth every evening.   fluticasone (FLONASE) 50 MCG/ACT nasal spray USE 2 SPRAYS INTO BOTH NOSTRILS DAILY ASNEEDED FOR ALLERGIES OR RHINITIS   lisinopril-hydrochlorothiazide (ZESTORETIC) 10-12.5 MG tablet Take 1 tablet by mouth daily. (Patient taking differently: Take 0.5 tablets by mouth daily.)   meloxicam (MOBIC) 7.5 MG tablet Take 7.5 mg by mouth daily. As needed.   metroNIDAZOLE (METROCREAM) 0.75 % cream Apply topically 2 (two) times daily. (Patient taking differently: Apply 1 application topically 2 (two) times daily.)   montelukast (SINGULAIR) 10 MG tablet Take 1 tablet (10 mg total) by mouth at bedtime.   mupirocin ointment (BACTROBAN) 2 % Apply 1 application topically 2 (two) times daily.   omeprazole (PRILOSEC) 40 MG capsule TAKE 1 CAPSULE BY MOUTH IN THE MORNING   valACYclovir (VALTREX) 1000 MG tablet TAKE 1 TABLET BY MOUTH DAILY (Patient taking differently: Take 1,000 mg by mouth daily.)     Allergies:  Ranexa [ranolazine]   Social History   Socioeconomic History   Marital status: Divorced    Spouse name: Not on file   Number of children: 2   Years of education: Not on file   Highest education level: Not on file  Occupational History   Not on file  Tobacco Use   Smoking status: Former    Pack years: 0.00    Types: Cigarettes    Quit date: 2019    Years since quitting: 3.4   Smokeless tobacco: Never   Tobacco comments:    on and off for 20 years   Vaping Use   Vaping Use: Former  Substance and Sexual Activity   Alcohol use: Yes     Alcohol/week: 1.0 - 2.0 standard drink    Types: 1 - 2 Standard drinks or equivalent per week   Drug use: Yes    Types: Marijuana    Comment: at night for insomnia sometimes    Sexual activity: Not on file  Other Topics Concern   Not on file  Social History Narrative   Lives alone    Right handed   Caffeine: 1 cup/day   Social Determinants of Health   Financial Resource Strain: Not on file  Food Insecurity: Not on file  Transportation Needs: Not on file  Physical Activity: Not on file  Stress: Not on file  Social Connections: Not on file     Family History: The patient's family history includes Atrial fibrillation in her sister; Colon cancer in her paternal grandfather; Dementia in her paternal grandmother; Depression in her mother; Diabetes in her brother, brother, and son; Heart attack in her maternal grandfather and maternal grandmother; Heart disease in her maternal grandmother; Heart failure in her sister; Hyperlipidemia in her brother, father, and mother; Hypertension in her brother, brother, and mother; Myelodysplastic syndrome in her brother; Skin cancer in her father; Sleep apnea in her brother. There is no history of Allergic rhinitis, Angioedema, Asthma, Atopy, Eczema, Immunodeficiency, or Urticaria. ROS:   Please see the history of present illness.    All 14 point review of systems negative except as described per history of present illness  EKGs/Labs/Other Studies Reviewed:      Recent Labs: 10/20/2019: Magnesium 2.0 08/30/2020: ALT 17; BUN 24; Creatinine, Ser 0.90; Hemoglobin 13.6; Platelets 198; Potassium 4.4; Sodium 143; TSH 1.230  Recent Lipid Panel    Component Value Date/Time   CHOL 134 08/30/2020 0851   TRIG 56 08/30/2020 0851   HDL 44 08/30/2020 0851   CHOLHDL 3.0 08/30/2020 0851   LDLCALC 78 08/30/2020 0851    Physical Exam:    VS:  BP 118/68 (BP Location: Right Arm, Patient Position: Sitting, Cuff Size: Normal)   Pulse 78   Ht 5\' 5"  (1.651 m)    Wt 171 lb 3.2 oz (77.7 kg)   SpO2 96%   BMI 28.49 kg/m     Wt Readings from Last 3 Encounters:  08/31/20 171 lb 3.2 oz (77.7 kg)  08/30/20 173 lb (78.5 kg)  08/12/20 171 lb (77.6 kg)     GEN:  Well nourished, well developed in no acute distress HEENT: Normal NECK: No JVD; No carotid bruits LYMPHATICS: No lymphadenopathy CARDIAC: RRR, no murmurs, no rubs, no gallops RESPIRATORY:  Clear to auscultation without rales, wheezing or rhonchi  ABDOMEN: Soft, non-tender, non-distended MUSCULOSKELETAL:  No edema; No deformity  SKIN: Warm and dry LOWER EXTREMITIES: no swelling NEUROLOGIC:  Alert and oriented x 3 PSYCHIATRIC:  Normal affect  ASSESSMENT:    1. Atypical chest pain   2. Abnormal stress test   3. Palpitations   4. Obstructive sleep apnea   5. Primary hypertension   6. Hypercholesterolemia    PLAN:    In order of problems listed above:  Atypical chest pain not heart related.  She did have coronary CT angio which is negative.  Continue risk factors modifications Abnormal stress test: CT angio after that normal Palpitations denies having any right now Obstructive sleep apnea: She admits that she does not use CPAP mask now after she lost 90 pounds she does not think she have any problem anymore. Essential hypertension: Blood pressure well controlled Dyslipidemia I did review her fasting lipid profile done just yesterday which show LDL of 78.   Medication Adjustments/Labs and Tests Ordered: Current medicines are reviewed at length with the patient today.  Concerns regarding medicines are outlined above.  No orders of the defined types were placed in this encounter.  Medication changes: No orders of the defined types were placed in this encounter.   Signed, Park Liter, MD, [facility] 08/31/2020 3:48 PM    [place]

## 2020-09-01 ENCOUNTER — Encounter: Payer: Self-pay | Admitting: Physician Assistant

## 2020-09-03 ENCOUNTER — Emergency Department (HOSPITAL_BASED_OUTPATIENT_CLINIC_OR_DEPARTMENT_OTHER): Payer: 59

## 2020-09-03 ENCOUNTER — Encounter (HOSPITAL_BASED_OUTPATIENT_CLINIC_OR_DEPARTMENT_OTHER): Payer: Self-pay | Admitting: *Deleted

## 2020-09-03 ENCOUNTER — Emergency Department (HOSPITAL_BASED_OUTPATIENT_CLINIC_OR_DEPARTMENT_OTHER)
Admission: EM | Admit: 2020-09-03 | Discharge: 2020-09-03 | Disposition: A | Discharge status: Home / Self Care | Payer: 59 | Attending: Emergency Medicine | Admitting: Emergency Medicine

## 2020-09-03 ENCOUNTER — Other Ambulatory Visit: Payer: Self-pay

## 2020-09-03 DIAGNOSIS — Z87891 Personal history of nicotine dependence: Secondary | ICD-10-CM | POA: Diagnosis not present

## 2020-09-03 DIAGNOSIS — X58XXXA Exposure to other specified factors, initial encounter: Secondary | ICD-10-CM | POA: Diagnosis not present

## 2020-09-03 DIAGNOSIS — Z79899 Other long term (current) drug therapy: Secondary | ICD-10-CM | POA: Diagnosis not present

## 2020-09-03 DIAGNOSIS — J45909 Unspecified asthma, uncomplicated: Secondary | ICD-10-CM | POA: Diagnosis not present

## 2020-09-03 DIAGNOSIS — G44309 Post-traumatic headache, unspecified, not intractable: Secondary | ICD-10-CM | POA: Diagnosis not present

## 2020-09-03 DIAGNOSIS — Z7982 Long term (current) use of aspirin: Secondary | ICD-10-CM | POA: Diagnosis not present

## 2020-09-03 DIAGNOSIS — Z7951 Long term (current) use of inhaled steroids: Secondary | ICD-10-CM | POA: Diagnosis not present

## 2020-09-03 DIAGNOSIS — F0781 Postconcussional syndrome: Secondary | ICD-10-CM | POA: Diagnosis not present

## 2020-09-03 DIAGNOSIS — S0990XA Unspecified injury of head, initial encounter: Secondary | ICD-10-CM

## 2020-09-03 DIAGNOSIS — I10 Essential (primary) hypertension: Secondary | ICD-10-CM | POA: Insufficient documentation

## 2020-09-03 DIAGNOSIS — R55 Syncope and collapse: Secondary | ICD-10-CM

## 2020-09-03 LAB — URINALYSIS, ROUTINE W REFLEX MICROSCOPIC
Bilirubin Urine: NEGATIVE
Glucose, UA: NEGATIVE mg/dL
Hgb urine dipstick: NEGATIVE
Ketones, ur: NEGATIVE mg/dL
Leukocytes,Ua: NEGATIVE
Nitrite: NEGATIVE
Protein, ur: NEGATIVE mg/dL
Specific Gravity, Urine: <1.005 — ABNORMAL LOW (ref 1.005–1.030)
pH: 6 (ref 5.0–8.0)

## 2020-09-03 LAB — COMPREHENSIVE METABOLIC PANEL
ALT: 18 U/L (ref 0–44)
AST: 21 U/L (ref 15–41)
Albumin: 3.9 g/dL (ref 3.5–5.0)
Alkaline Phosphatase: 50 U/L (ref 38–126)
Anion gap: 6 (ref 5–15)
BUN: 22 mg/dL — ABNORMAL HIGH (ref 6–20)
CO2: 26 mmol/L (ref 22–32)
Calcium: 8.9 mg/dL (ref 8.9–10.3)
Chloride: 103 mmol/L (ref 98–111)
Creatinine, Ser: 0.71 mg/dL (ref 0.44–1.00)
GFR, Estimated: >60 mL/min (ref >60)
Glucose, Bld: 88 mg/dL (ref 70–99)
Potassium: 3.9 mmol/L (ref 3.5–5.1)
Sodium: 135 mmol/L (ref 135–145)
Total Bilirubin: 0.3 mg/dL (ref 0.3–1.2)
Total Protein: 7.2 g/dL (ref 6.5–8.1)

## 2020-09-03 LAB — CBC WITH DIFFERENTIAL/PLATELET
Abs Immature Granulocytes: 0.02 10*3/uL (ref 0.00–0.07)
Basophils Absolute: 0 10*3/uL (ref 0.0–0.1)
Basophils Relative: 1 %
Eosinophils Absolute: 0.1 10*3/uL (ref 0.0–0.5)
Eosinophils Relative: 2 %
HCT: 37.5 % (ref 36.0–46.0)
Hemoglobin: 13 g/dL (ref 12.0–15.0)
Immature Granulocytes: 0 %
Lymphocytes Relative: 31 %
Lymphs Abs: 2.2 10*3/uL (ref 0.7–4.0)
MCH: 32.8 pg (ref 26.0–34.0)
MCHC: 34.7 g/dL (ref 30.0–36.0)
MCV: 94.7 fL (ref 80.0–100.0)
Monocytes Absolute: 0.6 10*3/uL (ref 0.1–1.0)
Monocytes Relative: 8 %
Neutro Abs: 4.3 10*3/uL (ref 1.7–7.7)
Neutrophils Relative %: 58 %
Platelets: 188 10*3/uL (ref 150–400)
RBC: 3.96 MIL/uL (ref 3.87–5.11)
RDW: 13 % (ref 11.5–15.5)
WBC: 7.3 10*3/uL (ref 4.0–10.5)
nRBC: 0 % (ref 0.0–0.2)

## 2020-09-03 LAB — CBG MONITORING, ED: Glucose-Capillary: 89 mg/dL (ref 70–99)

## 2020-09-03 NOTE — ED Provider Notes (Signed)
[facility] EMERGENCY DEPARTMENT Provider Note   CSN: [id number] Arrival date & time: 09/03/20  1704     History Chief Complaint  Patient presents with   Loss of Consciousness    Yolanda Sanchez is a 60 y.o. female.  60 year old female with past medical history below including hypertension, hyperlipidemia, anxiety/depression, OSA, atypical chest pain who presents with syncope and head injury.  Yesterday evening, the patient went on a blind date.  After having a single alcoholic drink, she stood up and walked to the bar and while standing the ER, she stated that she began not feeling well.  Her date reported that she collapsed to the ground and had a loss of consciousness for about 10 to 13 seconds.  No seizure activity.  She did hit the back of her head during the episode.  Today she has not felt right.  She has a knot on the back of her head and feels like she has had trouble concentrating, trouble remembering things.  She has had nausea but no vomiting.  No vision changes.  She also notes that throughout the day today, she has felt like her left leg does not want to work like it normally does.  She describes it as having to concentrate harder to get her leg to do what she wants it to do.  She has had the exact same symptoms previously for which she had an extensive neurology evaluation including MRI.  Work-up was normal.  She denies any recent illness.  They have lowered her blood pressure medications because she has lost 90 pounds over the past couple of years.  No anticoagulant use.  She recently saw her cardiologist and had a coronary CTA which was completely normal.  No chest pain or shortness of breath preceding the syncopal episode.  The history is provided by the patient.  Loss of Consciousness     Past Medical History:  Diagnosis Date   Abnormal stress test 11/10/2019   Anxiety    Asthma    Atypical chest pain 09/15/2019   Chest tightness 11/26/2019   Depression     Gait abnormality 11/03/2019   HSV infection    1 & 2    Hypercholesterolemia 09/11/2018   Hypertension    Left facial numbness 10/25/2019   Mixed hyperlipidemia 11/03/2019   Need for prophylactic vaccination and inoculation against influenza 03/01/2020   Need for tetanus, diphtheria, and acellular pertussis (Tdap) vaccine 03/01/2020   Obstructive sleep apnea 09/11/2018   Palpitations 09/15/2019   Paresthesia 11/03/2019   Swelling of both lower extremities 09/11/2018    Patient Active Problem List   Diagnosis Date Noted   Breast mass, right 08/30/2020   Burn 08/30/2020   Screen for sexually transmitted diseases 08/30/2020   Need for prophylactic vaccination against Streptococcus pneumoniae (pneumococcus) 08/30/2020   Asthma    Need for prophylactic vaccination and inoculation against influenza 03/01/2020   Need for tetanus, diphtheria, and acellular pertussis (Tdap) vaccine 03/01/2020   Chest tightness 11/26/2019   Depression    HSV infection    Abnormal stress test 11/10/2019   Paresthesia 11/03/2019   Gait abnormality 11/03/2019   Mixed hyperlipidemia 11/03/2019   Left facial numbness 10/25/2019   Palpitations 09/15/2019   Atypical chest pain 09/15/2019   Hypercholesterolemia 09/11/2018   Anxiety 09/11/2018   Hypertension 09/11/2018   Obstructive sleep apnea 09/11/2018   Swelling of both lower extremities 09/11/2018    Past Surgical History:  Procedure Laterality Date   BREAST  BIOPSY Left    CHOLECYSTECTOMY     COLONOSCOPY     TUBAL LIGATION       OB History   No obstetric history on file.     Family History  Problem Relation Age of Onset   Hyperlipidemia Mother    Hypertension Mother    Depression Mother    Hyperlipidemia Father    Skin cancer Father    Hypertension Brother    Diabetes Brother    Myelodysplastic syndrome Brother    Heart attack Maternal Grandmother    Heart disease Maternal Grandmother    Heart attack Maternal Grandfather    Dementia  Paternal Grandmother    Colon cancer Paternal Grandfather    Hypertension Brother    Hyperlipidemia Brother    Sleep apnea Brother    Diabetes Brother    Diabetes Son    Heart failure Sister    Atrial fibrillation Sister    Allergic rhinitis Neg Hx    Angioedema Neg Hx    Asthma Neg Hx    Atopy Neg Hx    Eczema Neg Hx    Immunodeficiency Neg Hx    Urticaria Neg Hx     Social History   Tobacco Use   Smoking status: Former    Pack years: 0.00    Types: Cigarettes    Quit date: 2019    Years since quitting: 3.4   Smokeless tobacco: Never   Tobacco comments:    on and off for 20 years   Vaping Use   Vaping Use: Former  Substance Use Topics   Alcohol use: Yes    Alcohol/week: 1.0 - 2.0 standard drink    Types: 1 - 2 Standard drinks or equivalent per week   Drug use: Yes    Types: Marijuana    Comment: at night for insomnia sometimes     Home Medications Prior to Admission medications   Medication Sig Start Date End Date Taking? Authorizing Provider  aspirin EC 81 MG tablet Take 1 tablet (81 mg total) by mouth daily. Swallow whole. 10/12/19   Park Liter, MD  buPROPion (WELLBUTRIN XL) 300 MG 24 hr tablet TAKE 1 TABLET BY MOUTH DAILY Patient taking differently: Take 300 mg by mouth daily. 06/22/20   Marge Duncans, PA-C  doxycycline (VIBRAMYCIN) 50 MG capsule Take 1 capsule (50 mg total) by mouth daily. 05/12/20   Kozlow, Donnamarie Poag, MD  famotidine (PEPCID) 40 MG tablet Take 1 tablet (40 mg total) by mouth every evening. 03/10/20   Kozlow, Donnamarie Poag, MD  fluticasone (FLONASE) 50 MCG/ACT nasal spray USE 2 SPRAYS INTO BOTH NOSTRILS DAILY ASNEEDED FOR ALLERGIES OR RHINITIS 08/30/20   Marge Duncans, PA-C  lisinopril-hydrochlorothiazide (ZESTORETIC) 10-12.5 MG tablet Take 1 tablet by mouth daily. Patient taking differently: Take 0.5 tablets by mouth daily. 04/13/20   Park Liter, MD  meloxicam (MOBIC) 7.5 MG tablet Take 7.5 mg by mouth daily. As needed.    [provider]   metroNIDAZOLE (METROCREAM) 0.75 % cream Apply topically 2 (two) times daily. Patient taking differently: Apply 1 application topically 2 (two) times daily. 02/11/20   Kozlow, Donnamarie Poag, MD  montelukast (SINGULAIR) 10 MG tablet Take 1 tablet (10 mg total) by mouth at bedtime. 02/11/20   Kozlow, Donnamarie Poag, MD  mupirocin ointment (BACTROBAN) 2 % Apply 1 application topically 2 (two) times daily. 08/30/20   Marge Duncans, PA-C  omeprazole (PRILOSEC) 40 MG capsule TAKE 1 CAPSULE BY MOUTH IN THE MORNING 08/30/20  Kozlow, Donnamarie Poag, MD  valACYclovir (VALTREX) 1000 MG tablet TAKE 1 TABLET BY MOUTH DAILY Patient taking differently: Take 1,000 mg by mouth daily. 05/06/20   Marge Duncans, PA-C    Allergies    Ranexa [ranolazine]  Review of Systems   Review of Systems  Cardiovascular:  Positive for syncope.  All other systems reviewed and are negative except that which was mentioned in HPI  Physical Exam Updated Vital Signs BP 114/65   Pulse 72   Temp 98.7 F (37.1 C) (Oral)   Resp 17   Ht _0  (1.651 m)   Wt 77.6 kg   SpO2 100%   BMI 28.47 kg/m   Physical Exam Vitals and nursing note reviewed.  Constitutional:      General: She is not in acute distress.    Appearance: She is well-developed.     Comments: Awake, alert  HENT:     Head: Normocephalic and atraumatic.  Eyes:     Extraocular Movements: Extraocular movements intact.     Conjunctiva/sclera: Conjunctivae normal.     Pupils: Pupils are equal, round, and reactive to light.  Cardiovascular:     Rate and Rhythm: Normal rate and regular rhythm.     Heart sounds: Normal heart sounds. No murmur heard. Pulmonary:     Effort: Pulmonary effort is normal. No respiratory distress.     Breath sounds: Normal breath sounds.  Abdominal:     General: Bowel sounds are normal. There is no distension.     Palpations: Abdomen is soft.     Tenderness: There is no abdominal tenderness.  Musculoskeletal:     Cervical back: Normal range of motion and neck  supple. No tenderness.  Skin:    General: Skin is warm and dry.  Neurological:     Mental Status: She is alert and oriented to person, place, and time.     Cranial Nerves: No cranial nerve deficit.     Motor: No abnormal muscle tone.     Deep Tendon Reflexes: Reflexes are normal and symmetric.     Comments: Fluent speech, normal finger-to-nose testing, negative pronator drift, no clonus 5/5 strength and normal sensation x all 4 extremities; normal heel-to-shin testing b/l  Psychiatric:        Thought Content: Thought content normal.        Judgment: Judgment normal.    ED Results / Procedures / Treatments   Labs (all labs ordered are listed, but only abnormal results are displayed) Labs Reviewed  COMPREHENSIVE METABOLIC PANEL - Abnormal; Notable for the following components:      Result Value   BUN 22 (*)    All other components within normal limits  URINALYSIS, ROUTINE W REFLEX MICROSCOPIC - Abnormal; Notable for the following components:   Color, Urine STRAW (*)    Specific Gravity, Urine <1.005 (*)    All other components within normal limits  CBC WITH DIFFERENTIAL/PLATELET  CBG MONITORING, ED    EKG EKG Interpretation  Date/Time:  Saturday September 03 2020 17:41:41 EDT Ventricular Rate:  66 PR Interval:  170 QRS Duration: 105 QT Interval:  400 QTC Calculation: 420 R Axis:   67 Text Interpretation: Sinus rhythm No significant change since last tracing Confirmed by Theotis Burrow [phone]) on 09/03/2020 6:11:37 PM  Radiology CT Head Wo Contrast  Result Date: 09/03/2020 CLINICAL DATA:  Passed out last night. Left-sided numbness and left leg weakness since then. EXAM: CT HEAD WITHOUT CONTRAST CT CERVICAL SPINE WITHOUT CONTRAST TECHNIQUE: Multidetector CT  imaging of the head and cervical spine was performed following the standard protocol without intravenous contrast. Multiplanar CT image reconstructions of the cervical spine were also generated. COMPARISON:  MRI cervical spine  dated October 28, 2019. MRI brain dated October 25, 2019. CT head dated July 04, 2019. FINDINGS: CT HEAD FINDINGS Brain: No evidence of acute infarction, hemorrhage, hydrocephalus, extra-axial collection or mass lesion/mass effect. Vascular: No hyperdense vessel or unexpected calcification. Skull: Normal. Negative for fracture or focal lesion. Sinuses/Orbits: No acute finding. Other: None. CT CERVICAL SPINE FINDINGS Alignment: Straightening of the normal cervical lordosis. No listhesis. Skull base and vertebrae: No acute fracture. No primary bone lesion or focal pathologic process. Soft tissues and spinal canal: No prevertebral fluid or swelling. No visible canal hematoma. Disc levels:  Unchanged mild multilevel disc height loss. Upper chest: Negative. Other: None. IMPRESSION: 1. No acute intracranial abnormality. 2. No acute cervical spine fracture or traumatic listhesis. The root Electronically Signed   By: Titus Dubin M.D.   On: 09/03/2020 18:50   CT Cervical Spine Wo Contrast  Result Date: 09/03/2020 CLINICAL DATA:  Passed out last night. Left-sided numbness and left leg weakness since then. EXAM: CT HEAD WITHOUT CONTRAST CT CERVICAL SPINE WITHOUT CONTRAST TECHNIQUE: Multidetector CT imaging of the head and cervical spine was performed following the standard protocol without intravenous contrast. Multiplanar CT image reconstructions of the cervical spine were also generated. COMPARISON:  MRI cervical spine dated October 28, 2019. MRI brain dated October 25, 2019. CT head dated July 04, 2019. FINDINGS: CT HEAD FINDINGS Brain: No evidence of acute infarction, hemorrhage, hydrocephalus, extra-axial collection or mass lesion/mass effect. Vascular: No hyperdense vessel or unexpected calcification. Skull: Normal. Negative for fracture or focal lesion. Sinuses/Orbits: No acute finding. Other: None. CT CERVICAL SPINE FINDINGS Alignment: Straightening of the normal cervical lordosis. No listhesis. Skull base and  vertebrae: No acute fracture. No primary bone lesion or focal pathologic process. Soft tissues and spinal canal: No prevertebral fluid or swelling. No visible canal hematoma. Disc levels:  Unchanged mild multilevel disc height loss. Upper chest: Negative. Other: None. IMPRESSION: 1. No acute intracranial abnormality. 2. No acute cervical spine fracture or traumatic listhesis. The root Electronically Signed   By: Titus Dubin M.D.   On: 09/03/2020 18:50    Procedures Procedures   Medications Ordered in ED Medications - No data to display  ED Course  I have reviewed the triage vital signs and the nursing notes.  Pertinent labs & imaging results that were available during my care of the patient were reviewed by me and considered in my medical decision making (see chart for details).    MDM Rules/Calculators/A&P                          Alert, no acute distress on exam with reassuring vital signs.  EKG showing sinus rhythm, normal QTC, no evidence of WPW or Brugada.  Screening lab work is unremarkable, no evidence of dehydration or electrolyte derangement. Because of head injury with ongoing symptoms, obtain CT of head and C-spine which was negative for acute injury.  I have discussed the possibility of postconcussive syndrome given her constellation of symptoms and discussed supportive measures for this as well as expectations for clinical course.  Regarding her report of left leg problems, she demonstrates normal strength, coordination, sensation, and DTRs on exam.  She also states that these are the same symptoms for which she had extensive neurology evaluation last year.  Neurology outpatient clinic note from 01/2020 states: "Myriad of somatic symptoms (left face/neck numbness, speech changes, vision complaints, weakness, imbalance), which do not conform to a central or peripheral nervous system condition.  Neurological exam is non-focal and her testing has excluded multiple sclerosis, stroke,  tumor, or compressive pathology of the cervical spine.  I have not seen her symptoms as an adverse effect of Ranexa and cannot isolate organic nerve pathology to suggest this is the cause."  Given this information and normal neurologic exam currently, I feel she is stable for outpatient neurology follow-up and I have extensively reviewed return precautions regarding the symptoms.  Regarding syncopal episode, I have recommended that she contact cardiology clinic for follow-up, consideration of further investigation with echo and/or Zio patch.  Patient voiced understanding of follow-up plan and return precautions. Final Clinical Impression(s) / ED Diagnoses Final diagnoses:  Syncope, unspecified syncope type  Closed head injury, initial encounter  Post concussive syndrome    Rx / DC Orders ED Discharge Orders     None        Tranae Laramie, Wenda Overland, MD 09/03/20 2319

## 2020-09-03 NOTE — ED Triage Notes (Signed)
Pt reports she was on a blind date last night and "passed out". Her date reported she was out ~13 seconds. EMS checked her out on site but she was not taken to hospital. States her blood sugar was "low". States since then she has been "dragging her left leg" and has left side numbness. Reports similar symptoms last August when she was started on a new heart medicine. Cao x 4 at present, grips equal, facial symmetry present

## 2020-09-05 ENCOUNTER — Encounter: Payer: Self-pay | Admitting: Physician Assistant

## 2020-09-05 ENCOUNTER — Ambulatory Visit (INDEPENDENT_AMBULATORY_CARE_PROVIDER_SITE_OTHER): Payer: 59

## 2020-09-05 DIAGNOSIS — R55 Syncope and collapse: Secondary | ICD-10-CM

## 2020-09-05 NOTE — Telephone Encounter (Signed)
Please call patient If having new acute symptoms of left sided weakness/facial numbness needs to go back to ED for further evaluation ASAP Otherwise if this is residual from after being seen in ED and having CT scan then she would need to make hospital follow up (30 min) with me for further evaluation and to get referral to neuro

## 2020-09-07 ENCOUNTER — Other Ambulatory Visit: Payer: Self-pay | Admitting: Physician Assistant

## 2020-09-07 DIAGNOSIS — N6310 Unspecified lump in the right breast, unspecified quadrant: Secondary | ICD-10-CM

## 2020-09-08 ENCOUNTER — Encounter: Payer: Self-pay | Admitting: Physician Assistant

## 2020-09-08 ENCOUNTER — Ambulatory Visit (INDEPENDENT_AMBULATORY_CARE_PROVIDER_SITE_OTHER): Payer: 59 | Admitting: Physician Assistant

## 2020-09-08 ENCOUNTER — Other Ambulatory Visit: Payer: Self-pay

## 2020-09-08 VITALS — BP 122/66 | HR 84 | Temp 97.4°F | Ht 65.0 in | Wt 177.0 lb

## 2020-09-08 DIAGNOSIS — R200 Anesthesia of skin: Secondary | ICD-10-CM | POA: Diagnosis not present

## 2020-09-08 DIAGNOSIS — R55 Syncope and collapse: Secondary | ICD-10-CM

## 2020-09-08 DIAGNOSIS — R269 Unspecified abnormalities of gait and mobility: Secondary | ICD-10-CM

## 2020-09-08 NOTE — Progress Notes (Signed)
Subjective:  Patient ID: Yolanda Sanchez, female    DOB: 20-Jun-1960  Age: 60 y.o. MRN: [id number]  Chief Complaint  Patient presents with   ER follow up    HPI  Pt here for follow up after having syncopal episode - She was seen at ED and had negative workup including normal CT of head Pt states that she still is having some postconcussive symptoms such as headache and some vision issues Pt will be making optometry visit  Pt has had longstanding symptoms of facial numbness and leg weakness for over a year -states that after having syncopal spell these symptoms became more pronounced She has seen 2 different neurologists and did have a normal MRI last year   Pt does follow with cardiology and has contacted their office - has been set up for echo and zio patch because she had the syncopal episode  Current Outpatient Medications on File Prior to Visit  Medication Sig Dispense Refill   aspirin EC 81 MG tablet Take 1 tablet (81 mg total) by mouth daily. Swallow whole. 90 tablet 3   buPROPion (WELLBUTRIN XL) 300 MG 24 hr tablet TAKE 1 TABLET BY MOUTH DAILY (Patient taking differently: Take 300 mg by mouth daily.) 90 tablet 0   doxycycline (VIBRAMYCIN) 50 MG capsule Take 1 capsule (50 mg total) by mouth daily. 30 capsule 5   famotidine (PEPCID) 40 MG tablet Take 1 tablet (40 mg total) by mouth every evening. 30 tablet 5   fluticasone (FLONASE) 50 MCG/ACT nasal spray USE 2 SPRAYS INTO BOTH NOSTRILS DAILY ASNEEDED FOR ALLERGIES OR RHINITIS 16 g 2   lisinopril-hydrochlorothiazide (ZESTORETIC) 10-12.5 MG tablet Take 1 tablet by mouth daily. (Patient taking differently: Take 0.5 tablets by mouth daily.) 90 tablet 1   meloxicam (MOBIC) 7.5 MG tablet Take 7.5 mg by mouth daily. As needed.     metroNIDAZOLE (METROCREAM) 0.75 % cream Apply topically 2 (two) times daily. (Patient taking differently: Apply 1 application topically 2 (two) times daily.) 45 g 5   montelukast (SINGULAIR) 10 MG tablet  Take 1 tablet (10 mg total) by mouth at bedtime. 30 tablet 5   mupirocin ointment (BACTROBAN) 2 % Apply 1 application topically 2 (two) times daily. 22 g 0   omeprazole (PRILOSEC) 40 MG capsule TAKE 1 CAPSULE BY MOUTH IN THE MORNING 30 capsule 3   valACYclovir (VALTREX) 1000 MG tablet TAKE 1 TABLET BY MOUTH DAILY (Patient taking differently: Take 1,000 mg by mouth daily.) 30 tablet 5   No current facility-administered medications on file prior to visit.   Past Medical History:  Diagnosis Date   Abnormal stress test 11/10/2019   Anxiety    Asthma    Atypical chest pain 09/15/2019   Chest tightness 11/26/2019   Depression    Gait abnormality 11/03/2019   HSV infection    1 & 2    Hypercholesterolemia 09/11/2018   Hypertension    Left facial numbness 10/25/2019   Mixed hyperlipidemia 11/03/2019   Need for prophylactic vaccination and inoculation against influenza 03/01/2020   Need for tetanus, diphtheria, and acellular pertussis (Tdap) vaccine 03/01/2020   Obstructive sleep apnea 09/11/2018   Palpitations 09/15/2019   Paresthesia 11/03/2019   Swelling of both lower extremities 09/11/2018   Past Surgical History:  Procedure Laterality Date   BREAST BIOPSY Left    CHOLECYSTECTOMY     COLONOSCOPY     TUBAL LIGATION      Family History  Problem Relation Age of Onset  Hyperlipidemia Mother    Hypertension Mother    Depression Mother    Hyperlipidemia Father    Skin cancer Father    Hypertension Brother    Diabetes Brother    Myelodysplastic syndrome Brother    Heart attack Maternal Grandmother    Heart disease Maternal Grandmother    Heart attack Maternal Grandfather    Dementia Paternal Grandmother    Colon cancer Paternal Grandfather    Hypertension Brother    Hyperlipidemia Brother    Sleep apnea Brother    Diabetes Brother    Diabetes Son    Heart failure Sister    Atrial fibrillation Sister    Allergic rhinitis Neg Hx    Angioedema Neg Hx    Asthma Neg Hx    Atopy Neg Hx     Eczema Neg Hx    Immunodeficiency Neg Hx    Urticaria Neg Hx    Social History   Socioeconomic History   Marital status: Divorced    Spouse name: Not on file   Number of children: 2   Years of education: Not on file   Highest education level: Not on file  Occupational History   Not on file  Tobacco Use   Smoking status: Former    Pack years: 0.00    Types: Cigarettes    Quit date: 2019    Years since quitting: 3.4   Smokeless tobacco: Never   Tobacco comments:    on and off for 20 years   Vaping Use   Vaping Use: Former  Substance and Sexual Activity   Alcohol use: Yes    Alcohol/week: 1.0 - 2.0 standard drink    Types: 1 - 2 Standard drinks or equivalent per week   Drug use: Yes    Types: Marijuana    Comment: at night for insomnia sometimes    Sexual activity: Not on file  Other Topics Concern   Not on file  Social History Narrative   Lives alone    Right handed   Caffeine: 1 cup/day   Social Determinants of Health   Financial Resource Strain: Not on file  Food Insecurity: Not on file  Transportation Needs: Not on file  Physical Activity: Not on file  Stress: Not on file  Social Connections: Not on file    Review of Systems  CONSTITUTIONAL: Negative for chills, fatigue, fever, unintentional weight gain and unintentional weight loss.  E/N/T: Negative for ear pain, nasal congestion and sore throat.  CARDIOVASCULAR: pt has had some intermittent palpitations RESPIRATORY: Negative for recent cough and dyspnea.  GASTROINTESTINAL: Negative for abdominal pain, acid reflux symptoms, constipation, diarrhea, nausea and vomiting.  MSK: Negative for arthralgias and myalgias.  INTEGUMENTARY: Negative for rash.  NEUROLOGICAL: see HPI PSYCHIATRIC: Negative for sleep disturbance and to question depression screen.  Negative for depression, negative for anhedonia.      Objective:  BP 122/66   Pulse 84   Temp (!) 97.4 F (36.3 C)   Ht 5\' 5"  (1.651 m)   Wt 177 lb  (80.3 kg)   SpO2 99%   BMI 29.45 kg/m   BP/Weight 09/08/2020 09/03/2020 05/25/1759  Systolic BP 607 371 062  Diastolic BP 66 65 68  Wt. (Lbs) 177 171.08 171.2  BMI 29.45 28.47 28.49    Physical Exam PHYSICAL EXAM:   VS: BP 122/66   Pulse 84   Temp (!) 97.4 F (36.3 C)   Ht 5\' 5"  (1.651 m)   Wt 177 lb (80.3 kg)  SpO2 99%   BMI 29.45 kg/m   GEN: Well nourished, well developed, in no acute distress  Cardiac: RRR; no murmurs, rubs, or gallops,no edema -no significant varicosities Respiratory:  normal respiratory rate and pattern with no distress - normal breath sounds with no rales, rhonchi, wheezes or rubs MS: no deformity or atrophy  Skin: warm and dry, no rash  Neuro:  Alert and Oriented x 3, - CN II-Xii grossly intact Psych: euthymic mood, appropriate affect and demeanor  Diabetic Foot Exam - Simple   No data filed      Lab Results  Component Value Date   WBC 7.3 09/03/2020   HGB 13.0 09/03/2020   HCT 37.5 09/03/2020   PLT 188 09/03/2020   GLUCOSE 88 09/03/2020   CHOL 134 08/30/2020   TRIG 56 08/30/2020   HDL 44 08/30/2020   LDLCALC 78 08/30/2020   ALT 18 09/03/2020   AST 21 09/03/2020   NA 135 09/03/2020   K 3.9 09/03/2020   CL 103 09/03/2020   CREATININE 0.71 09/03/2020   BUN 22 (H) 09/03/2020   CO2 26 09/03/2020   TSH 1.230 08/30/2020      Assessment & Plan:   1. Syncope and collapse Continue with cardiology recommendations 2. Left facial numbness - Ambulatory referral to Neurology  3. Gait abnormality - Ambulatory referral to Neurology    No orders of the defined types were placed in this encounter.   Orders Placed This Encounter  Procedures   Ambulatory referral to Neurology     Follow-up: as previously scheduled Recommend to make eye apt  An After Visit Summary was printed and given to the patient.  Yetta Flock [facility] [phone]

## 2020-09-09 DIAGNOSIS — R55 Syncope and collapse: Secondary | ICD-10-CM

## 2020-09-13 ENCOUNTER — Other Ambulatory Visit: Payer: Self-pay | Admitting: Physician Assistant

## 2020-09-13 DIAGNOSIS — T300 Burn of unspecified body region, unspecified degree: Secondary | ICD-10-CM

## 2020-09-19 NOTE — Telephone Encounter (Signed)
Called patient and had her to call Dr. Jaynee Eagles and make an appointment.

## 2020-09-26 ENCOUNTER — Ambulatory Visit (INDEPENDENT_AMBULATORY_CARE_PROVIDER_SITE_OTHER): Payer: 59

## 2020-09-26 ENCOUNTER — Other Ambulatory Visit: Payer: Self-pay | Admitting: Cardiology

## 2020-09-26 ENCOUNTER — Telehealth: Payer: Self-pay | Admitting: Cardiology

## 2020-09-26 ENCOUNTER — Other Ambulatory Visit: Payer: Self-pay

## 2020-09-26 DIAGNOSIS — R55 Syncope and collapse: Secondary | ICD-10-CM | POA: Diagnosis not present

## 2020-09-26 LAB — ECHOCARDIOGRAM COMPLETE
Area-P 1/2: 3.06 cm2
S' Lateral: 2.8 cm

## 2020-09-26 NOTE — Telephone Encounter (Signed)
[name] is calling requesting to be worked in for a f/u to discuss her results after her last test which is scheduled on 10/12/20. She states when she had these test ran last year no one ever called her with her results so she wold like to come in for an appt to discuss it. Please advise.

## 2020-09-26 NOTE — Telephone Encounter (Signed)
Spoke to the patient just now and I let her know that Dr. Wendy Poet soonest opening is on 12/06/2020. She agreed to come in that day to see him. I also advised her that she would receive a phone call in regards to her results once her tests were completed and she verbalizes understanding.    Encouraged patient to call back with any questions or concerns.

## 2020-09-26 NOTE — Progress Notes (Signed)
Complete echocardiogram performed.  Jimmy Dorthey Depace RDCS, RVT  

## 2020-09-27 ENCOUNTER — Other Ambulatory Visit: Payer: Self-pay | Admitting: Allergy and Immunology

## 2020-09-27 ENCOUNTER — Other Ambulatory Visit: Payer: Self-pay | Admitting: Cardiology

## 2020-10-03 ENCOUNTER — Encounter: Payer: Self-pay | Admitting: Neurology

## 2020-10-03 ENCOUNTER — Ambulatory Visit: Payer: 59 | Admitting: Neurology

## 2020-10-03 VITALS — BP 151/88 | HR 76 | Ht 65.0 in | Wt 171.5 lb

## 2020-10-03 DIAGNOSIS — R55 Syncope and collapse: Secondary | ICD-10-CM

## 2020-10-03 DIAGNOSIS — R6889 Other general symptoms and signs: Secondary | ICD-10-CM | POA: Diagnosis not present

## 2020-10-03 DIAGNOSIS — R200 Anesthesia of skin: Secondary | ICD-10-CM

## 2020-10-03 DIAGNOSIS — R202 Paresthesia of skin: Secondary | ICD-10-CM

## 2020-10-03 DIAGNOSIS — R29898 Other symptoms and signs involving the musculoskeletal system: Secondary | ICD-10-CM | POA: Diagnosis not present

## 2020-10-03 DIAGNOSIS — H539 Unspecified visual disturbance: Secondary | ICD-10-CM

## 2020-10-03 NOTE — Patient Instructions (Addendum)
EEG   Syncope  Syncope refers to a condition in which a person temporarily loses consciousness. Syncope may also be called fainting or passing out. It is caused by a sudden decrease in blood flow to the brain. Even though most causes of syncope are not dangerous, syncope can be a sign of a serious medical problem. Your health care provider may do tests to find the reason why you are havingsyncope. Signs that you may be about to faint include: Feeling dizzy or light-headed. Feeling nauseous. Seeing all white or all black in your field of vision. Having cold, clammy skin. If you faint, get medical help right away. Call your local emergency services (911 in the [place] Do not drive yourself to the hospital. Follow these instructions at home: Pay attention to any changes in your symptoms. Take these actions to stay safeand to help relieve your symptoms: Lifestyle Do not drive, use machinery, or play sports until your health care provider says it is okay. Do not drink alcohol. Do not use any products that contain nicotine or tobacco, such as cigarettes and e-cigarettes. If you need help quitting, ask your health care provider. Drink enough fluid to keep your urine pale yellow. General instructions Take over-the-counter and prescription medicines only as told by your health care provider. If you are taking blood pressure or heart medicine, get up slowly and take several minutes to sit and then stand. This can reduce dizziness or light-headedness. Have someone stay with you until you feel stable. If you start to feel like you might faint, lie down right away and raise (elevate) your feet above the level of your heart. Breathe deeply and steadily. Wait until all the symptoms have passed. Keep all follow-up visits as told by your health care provider. This is important. Get help right away if you: Have a severe headache. Faint once or repeatedly. Have pain in your chest, abdomen, or back. Have a  very fast or irregular heartbeat (palpitations). Have pain when you breathe. Are bleeding from your mouth or rectum, or you have black or tarry stool. Have a seizure. Are confused. Have trouble walking. Have severe weakness. Have vision problems. These symptoms may represent a serious problem that is an emergency. Do not wait to see if your symptoms will go away. Get medical help right away. Call your local emergency services (911 in the [place] Do not drive yourself to the hospital. Summary Syncope refers to a condition in which a person temporarily loses consciousness. It is caused by a sudden decrease in blood flow to the brain. Signs that you may be about to faint include dizziness, feeling light-headed, feeling nauseous, sudden vision changes, or cold, clammy skin. Although most causes of syncope are not dangerous, syncope can be a sign of a serious medical problem. If you faint, get medical help right away. This information is not intended to replace advice given to you by your health care provider. Make sure you discuss any questions you have with your healthcare provider. Document Revised: 06/09/2019 Document Reviewed: 07/09/2019 Elsevier Patient Education  [place] Syndrome  A concussion is a brain injury from a direct hit to the head or body. This hit causes the brain to shake quickly back and forth inside the skull. This can damage brain cells and cause chemical changes in the brain. Concussions areusually not life-threatening but can cause serious symptoms. Post-concussion syndrome is when symptoms that occur after a concussion lastlonger than normal. These symptoms can last from  weeks to months. What are the causes? The cause of this condition is not known. It can happen whether your headinjury was mild or severe. What increases the risk? You are more likely to develop this condition if: You are female. You are a child, teen, or young adult. You have  had a past head injury. You have a history of headaches. You have depression or anxiety. You have loss of consciousness or cannot remember the event (have amnesia of the event). You have multiple symptoms or severe symptoms at the time of your concussion. What are the signs or symptoms? Symptoms of this condition include: Physical symptoms. You may have: Headaches. Tiredness. Dizziness and weakness. Blurry vision and sensitivity to light. Hearing difficulties. Problems with balance. Mental and emotional symptoms. You may have: Memory problems and trouble concentrating. Difficulty sleeping or staying asleep. Feelings of irritability. Anxiety or depression. Difficulty learning new things. How is this diagnosed? This condition may be diagnosed based on: Your symptoms. A description of your injury. Your medical history. Testing your strength, balance, and nerve function (neurological examination). Your health care provider may order other tests, including brain imaging such as a CT scan or an MRI, and memory testing (neuropsychological testing). How is this treated? Treatment for this condition may depend on your symptoms. Symptoms usually go away on their own over time. Treatments may include: Medicines for headaches, anxiety, depression, and trouble sleeping (insomnia). Resting your brain and body for a few days after your injury. Rehabilitation therapy, such as: Physical or occupational therapy. This may include exercises to help with balance and dizziness. Mental health counseling. A form of talk therapy called cognitive behavioral therapy (CBT) can be especially helpful. This therapy helps you set goals and follow up on the changes that you make. Speech therapy. Vision therapy. A brain and eye specialist can recommend treatments for vision problems. Follow these instructions at home: Medicines Take over-the-counter and prescription medicines only as told by your health care  provider. Avoid opioid prescription pain medicines when recovering from a concussion. Activity Limit your mental activities for the first few days after your injury. This may include not doing the following: Homework or job-related work. Complex thinking. Watching TV, and using a computer or phone. Playing memory games and puzzles. Gradually return to your normal activity level. If a certain activity brings on your symptoms, stop or slow down until you can do the activity without it triggering your symptoms. Limit physical activity, such as exercise or sports, for the first few days after a concussion. Gradually return to normal activity as told by your health care provider. Rest. Rest helps your brain heal. Make sure you: Get plenty of sleep at night. Most adults should get at least 7-9 hours of sleep each night. Rest during the day. Take naps or rest breaks when you feel tired. Do not do high-risk activities that could cause a second concussion, such as riding a bike or playing sports. Having another concussion before the first one has healed can be dangerous. General instructions  Do not drink alcohol until your health care provider says that you can. Keep track of the frequency and the severity of your symptoms. Give this information to your health care provider. Keep all follow-up visits as directed by your health care provider. This is important. This includes visits with specialists.  Contact a health care provider if: Your symptoms do not improve. You have another injury. Get help right away if you: Have a severe or worsening headache.  Are confused. Have trouble staying awake. Faint. Vomit. Have weakness or numbness in any part of your body. Have a seizure. Have trouble speaking. Summary Post-concussion syndrome is when symptoms that occur after a concussion last longer than normal. Symptoms usually go away on their own over time. Depending on your symptoms, you may need  treatment, such as medicines or rehabilitation therapy. Rest your brain and body for a few days after your injury. Gradually return to normal activities as told by your health care provider. Get plenty of sleep, and avoid alcohol and opioid pain medicines while recovering from a concussion. This information is not intended to replace advice given to you by your health care provider. Make sure you discuss any questions you have with your healthcare provider. Document Revised: 02/18/2019 Document Reviewed: 02/18/2019 Elsevier Patient Education  [place].

## 2020-10-03 NOTE — Progress Notes (Addendum)
[facility]    Provider:  Dr Jaynee Eagles Requesting Provider: Charlesetta Shanks, MD Primary Care Provider:  Marge Duncans, PA-C  CC:  Facial numbness  Interval history October 03, 2020: Patient seen multiple neurologists.  She saw me in August 2021 for facial numbness work-up was unremarkable.  She saw Dr. Narda Amber in November 2021 for "evaluation of multiple complaints" including similar thing she saw me for with left cheek numbness.  Constellation of other symptoms including blurred vision, eye twitching, difficulty swallowing, facial weakness, gait difficulty, stuttering and slurred speech.  MRI of the brain and the cervical spine was normal. Her face remains numb.  I reviewed Dr. Serita Grit notes.  Neurologic exam was normal.  Dr. Posey Pronto noted a "myriad of somatic symptoms" including left face neck numbness, speech changes, vision complaints, weakness, balance which do not conform to a central or peripheral nervous system condition, neurologic exam is nonfocal and her testing is included multiple sclerosis extract tumor or compressive pathology of the cervical spine.  Dr. Posey Pronto also checked B12 and B1.  She is here for the same problems, the whole left side of her body loss of sensation of face, leg arm, she had a black out she was standing, felt something, felt lightheaded, didn't have time to sit down, no abnormal movements, no urinating or deficating on herself, out for 13 seconds, no post-ictal confusion. She hs ths the same symptoms, vision was checked, still same symptoms left face/neck numbness, vision changes, weakness, imbalance.She "has been to every doctor about this". She had an echocardiogram. She has long-term cardiac monitoring. She is having a VAS US Carotid. Getting upset triggers her left sided facial sensory changes. She has been waking up with headaches daily starting 3 weeks prior to blacking out and the headache never really goes away with sensitivity to sound and light  and some ringing in ears on occasion. Her coworkers say she has a "bad look" on her  face all time.  FINDINGS: Personally reviewed images and agree with findings. CT HEAD FINDINGS   Brain: No evidence of acute infarction, hemorrhage, hydrocephalus, extra-axial collection or mass lesion/mass effect.   Vascular: No hyperdense vessel or unexpected calcification.   Skull: Normal. Negative for fracture or focal lesion.   Sinuses/Orbits: No acute finding.   Other: None.   CT CERVICAL SPINE FINDINGS   Alignment: Straightening of the normal cervical lordosis. No listhesis.   Skull base and vertebrae: No acute fracture. No primary bone lesion or focal pathologic process.   Soft tissues and spinal canal: No prevertebral fluid or swelling. No visible canal hematoma.   Disc levels:  Unchanged mild multilevel disc height loss.   Upper chest: Negative.   Other: None.   IMPRESSION: 1. No acute intracranial abnormality. 2. No acute cervical spine fracture or traumatic listhesis. The root   HPI 10/2019:  Yolanda Sanchez is a 60 y.o. female here as requested by  Charlesetta Shanks, MD for facial numbness.  She has a past medical history of palpitations and atypical chest pain, hypercholesterolemia, anxiety, hypertension, obstructive sleep apnea and swelling of both lower extremities.  I reviewed Charlesetta Shanks, MD's notes: Patient was seen at the emergency room October 20, 2019 after starting to feel a sensation of numbness in the left face, most noticeable over the zygomatic arch of the cheek, persisted and spread to include more of the lower face and the side of her neck on the left, of the course the day she also started feeling tightness  in the left side of the neck, no difficulty swallowing or breathing, no fevers or chills, no headache, no visual changes, no weakness numbness or tingling of the lower extremities, she did have a blister on the inside of her mouth last week that was  carefully checked and evaluated by her doctor for HSV and 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  Also recently suffering a lot of anxiety.  Examination showed subjective difference to light touch in the left side of the face from below the eye to the side of the neck, otherwise nonfocal, motor 5 out of 5, patient was anxious and sometimes tearful.  Labs showed positive THC otherwise CBC, CMP, unremarkable.  She was started on steroids, continued on Valtrex, presumed early Bell's palsy, no objective exam findings. Sed rate was normal.   Patient is here alone and reports acute onset facial numbness, decreased sensation, feels like dental anesthesia, it in her eye, as well as blurred vision, started a few weeks ago also develops her cheek, ear, into the neck and back of the throat, she has to clear her throat a bunch, she is having trouble swallowing food, also reports left eye twitching. Back in April she had an episode of dizziness, chest pain, she had a workup and CT scan of the head and everything was normal. She was put on medications Renexa and BP medications, her BP shot up to 163/103 a few days ago and at the same time she had numbness on the left side of the faceShe is having eye pain and it feels tired, watering, overly dry, anesthesias on the left cheek, loss of sensation down the throat and difficulty slobering and loss of sensation subjectively. She feels she can;t swallow. She has spit collecting on the left side of her mouth and weakness on the left side of the face. Monocular blurry vision. She is havng eye twitching. The vision changed prior to the numbness which started on the 9th (was in the ED 10th), the eye issue started happening a few weeks ago unclear if connected. No other rashes, lesions of the genitals.    Reviewed notes, labs and imaging from outside physicians, which showed:    CT head 06/2019: showed No acute  intracranial abnormalities including mass lesion or mass effect, hydrocephalus, extra-axial fluid collection, midline shift, hemorrhage, or acute infarction, large ischemic events (personally reviewed images)     Review of Systems: Patient complains of symptoms per HPI as well as the following symptoms: decreased concentration . Pertinent negatives and positives per HPI. All others negative    Social History   Socioeconomic History   Marital status: Divorced    Spouse name: Not on file   Number of children: 2   Years of education: Not on file   Highest education level: Not on file  Occupational History   Not on file  Tobacco Use   Smoking status: Former    Types: Cigarettes    Quit date: 2019    Years since quitting: 3.5   Smokeless tobacco: Never   Tobacco comments:    on and off for 20 years   Vaping Use   Vaping Use: Former  Substance and Sexual Activity   Alcohol use: Yes    Alcohol/week: 1.0 - 2.0 standard drink    Types: 1 - 2 Standard drinks or equivalent per week   Drug use: Yes    Types:  Marijuana    Comment: at night for insomnia sometimes    Sexual activity: Not on file  Other Topics Concern   Not on file  Social History Narrative   Lives alone    Right handed   Caffeine: 1 cup/day   Social Determinants of Health   Financial Resource Strain: Not on file  Food Insecurity: Not on file  Transportation Needs: Not on file  Physical Activity: Not on file  Stress: Not on file  Social Connections: Not on file  Intimate Partner Violence: Not on file    Family History  Problem Relation Age of Onset   Hyperlipidemia Mother    Hypertension Mother    Depression Mother    Hyperlipidemia Father    Skin cancer Father    Hypertension Brother    Diabetes Brother    Myelodysplastic syndrome Brother    Heart attack Maternal Grandmother    Heart disease Maternal Grandmother    Heart attack Maternal Grandfather    Dementia Paternal Grandmother    Colon  cancer Paternal Grandfather    Hypertension Brother    Hyperlipidemia Brother    Sleep apnea Brother    Diabetes Brother    Diabetes Son    Heart failure Sister    Atrial fibrillation Sister    Allergic rhinitis Neg Hx    Angioedema Neg Hx    Asthma Neg Hx    Atopy Neg Hx    Eczema Neg Hx    Immunodeficiency Neg Hx    Urticaria Neg Hx     Past Medical History:  Diagnosis Date   Abnormal stress test 11/10/2019   Anxiety    Asthma    Atypical chest pain 09/15/2019   Chest tightness 11/26/2019   Depression    Gait abnormality 11/03/2019   HSV infection    1 & 2    Hypercholesterolemia 09/11/2018   Hypertension    Left facial numbness 10/25/2019   Mixed hyperlipidemia 11/03/2019   Need for prophylactic vaccination and inoculation against influenza 03/01/2020   Need for tetanus, diphtheria, and acellular pertussis (Tdap) vaccine 03/01/2020   Obstructive sleep apnea 09/11/2018   Palpitations 09/15/2019   Paresthesia 11/03/2019   Swelling of both lower extremities 09/11/2018    Patient Active Problem List   Diagnosis Date Noted   Syncope and collapse 09/08/2020   Breast mass, right 08/30/2020   Burn 08/30/2020   Screen for sexually transmitted diseases 08/30/2020   Need for prophylactic vaccination against Streptococcus pneumoniae (pneumococcus) 08/30/2020   Asthma    Need for prophylactic vaccination and inoculation against influenza 03/01/2020   Need for tetanus, diphtheria, and acellular pertussis (Tdap) vaccine 03/01/2020   Chest tightness 11/26/2019   Depression    HSV infection    Abnormal stress test 11/10/2019   Paresthesia 11/03/2019   Gait abnormality 11/03/2019   Mixed hyperlipidemia 11/03/2019   Left facial numbness 10/25/2019   Palpitations 09/15/2019   Atypical chest pain 09/15/2019   Hypercholesterolemia 09/11/2018   Anxiety 09/11/2018   Hypertension 09/11/2018   Obstructive sleep apnea 09/11/2018   Swelling of both lower extremities 09/11/2018    Past  Surgical History:  Procedure Laterality Date   BREAST BIOPSY Left    CHOLECYSTECTOMY     COLONOSCOPY     TUBAL LIGATION      Current Outpatient Medications  Medication Sig Dispense Refill   albuterol (VENTOLIN HFA) 108 (90 Base) MCG/ACT inhaler INHALE 2 PUFF EVERY 4 TO 6 HOURS AS NEEDED FOR COUGH OR WHEEZE  8.5 g 0   aspirin EC 81 MG tablet Take 1 tablet (81 mg total) by mouth daily. Swallow whole. 90 tablet 3   buPROPion (WELLBUTRIN XL) 300 MG 24 hr tablet TAKE 1 TABLET BY MOUTH DAILY (Patient taking differently: Take 300 mg by mouth daily.) 90 tablet 0   doxycycline (VIBRAMYCIN) 50 MG capsule Take 1 capsule (50 mg total) by mouth daily. 30 capsule 5   famotidine (PEPCID) 40 MG tablet TAKE 1 TABLET BY MOUTH EVERY EVENING 30 tablet 0   fluticasone (FLONASE) 50 MCG/ACT nasal spray USE 2 SPRAYS INTO BOTH NOSTRILS DAILY ASNEEDED FOR ALLERGIES OR RHINITIS 16 g 2   lisinopril-hydrochlorothiazide (ZESTORETIC) 10-12.5 MG tablet Take 0.5 tablets by mouth daily. 45 tablet 3   meloxicam (MOBIC) 7.5 MG tablet Take 7.5 mg by mouth daily. As needed.     metroNIDAZOLE (METROCREAM) 0.75 % cream Apply topically 2 (two) times daily. (Patient taking differently: Apply 1 application topically 2 (two) times daily.) 45 g 5   montelukast (SINGULAIR) 10 MG tablet Take 1 tablet (10 mg total) by mouth at bedtime. 30 tablet 5   mupirocin ointment (BACTROBAN) 2 % APPLY 1 APPLICATION TOPICALLY 2 TIMES DAILY 22 g 0   omeprazole (PRILOSEC) 40 MG capsule TAKE 1 CAPSULE BY MOUTH IN THE MORNING 30 capsule 3   valACYclovir (VALTREX) 1000 MG tablet TAKE 1 TABLET BY MOUTH DAILY (Patient taking differently: Take 1,000 mg by mouth daily.) 30 tablet 5   No current facility-administered medications for this visit.    Allergies as of 10/03/2020 - Review Complete 10/03/2020  Allergen Reaction Noted   Ranexa [ranolazine]  11/26/2019    Vitals: BP (!) 151/88   Pulse 76   Ht '5\' 5"'  (1.651 m)   Wt 171 lb 8 oz (77.8 kg)   BMI  28.54 kg/m  Last Weight:  Wt Readings from Last 1 Encounters:  10/03/20 171 lb 8 oz (77.8 kg)   Last Height:   Ht Readings from Last 1 Encounters:  10/03/20 '5\' 5"'  (1.651 m)   Exam: NAD, pleasant                  Speech:    Speech is normal; fluent and spontaneous with normal comprehension.  Cognition:    The patient is oriented to person, place, and time;     recent and remote memory intact;     language fluent;    Cranial Nerves:    The pupils are equal, round, and reactive to light.Trigeminal sensation is intact and the muscles of mastication are normal. The face is symmetric. The palate elevates in the midline. Hearing intact. Voice is normal. Shoulder shrug is normal. The tongue has normal motion without fasciculations.   Coordination:  No dysmetria  Motor Observation:    No asymmetry, no atrophy, and no involuntary movements noted. Tone:    Normal muscle tone.     Strength:    Strength is V/V in the upper and lower limbs.      Sensation: intact to LT    Assessment/Plan:  She is here for similar problems she saw me for 10/2019 and another neurologist 01/2020(Dr. Narda Amber), the whole left side of her body loss of sensation of face, leg arm, she had a black out for 13 seconds she was standing, felt something, felt lightheaded, didn't have time to sit down, no abnormal movements, no urinating or deficating on herself, out for 13 seconds, no post-ictal confusion, less likely. Otherwise She has had  simi;ar symptoms that she discussed with me in 10/2019 and Dr. Posey Pronto in 01/2020, vision changes (vision was checked), episodes of left face/neck numbness, weakness, imbalance.She "has been to every doctor about this". So unfortunately she is frustrated She had an echocardiogram. She has had long-term cardiac monitoring. She is having a VAS US Carotid, pending. Getting upset is a trigger and causes her left sided facial sensory changes. MRI of the brain and cervical spine have been  unremarkable in the past. I do NOT think this is "complicated" migraines. And the syncope sounds like vasovagal or orthostatic syncope but will check routine EEG.I believe anxiety may be a factor. Until eeg results, she should not drive.   Unfortunately I had a discussion with patient, I cannot find any neurologic disorder to account for her symptoms. She has also seen Dr. Narda Amber at [facility] neurology whose opinion was the same. But as always, we are not perfect as physicians as much as we try; and if patient feels that she has something undiagnosed I recommend she discuss referral to [facility] or [facility] with her primary care for another set of eyes. In the meantime we will order an EEG.  Orders Placed This Encounter  Procedures   EEG adult     Cc: Marge Duncans, PA-C   Sarina Ill, MD  [facility] [address] [place], [place] [address]  Phone [phone] Fax [phone]  I spent 40 minutes of face-to-face and non-face-to-face time with patient on the  1. Syncope, unspecified syncope type   2. Syncope and collapse   3. Numbness and tingling of right arm   4. Left leg weakness   5. Multiple somatic complaints   6. Left facial numbness   7. Vision changes    diagnosis.  This included previsit chart review, lab review, study review, order entry, electronic health record documentation, patient education on the different diagnostic and therapeutic options, counseling and coordination of care, risks and benefits of management, compliance, or risk factor reduction

## 2020-10-12 ENCOUNTER — Ambulatory Visit (INDEPENDENT_AMBULATORY_CARE_PROVIDER_SITE_OTHER): Payer: 59

## 2020-10-12 ENCOUNTER — Other Ambulatory Visit: Payer: Self-pay

## 2020-10-12 DIAGNOSIS — R55 Syncope and collapse: Secondary | ICD-10-CM | POA: Diagnosis not present

## 2020-10-12 NOTE — Progress Notes (Addendum)
Carotid duplex exam has been performed.  Jimmy Cejay Cambre RDCS, RVT 

## 2020-10-13 ENCOUNTER — Encounter: Payer: Self-pay | Admitting: Physician Assistant

## 2020-10-13 ENCOUNTER — Telehealth: Payer: Self-pay

## 2020-10-13 DIAGNOSIS — E7800 Pure hypercholesterolemia, unspecified: Secondary | ICD-10-CM

## 2020-10-13 DIAGNOSIS — I10 Essential (primary) hypertension: Secondary | ICD-10-CM

## 2020-10-13 MED ORDER — ATORVASTATIN CALCIUM 10 MG PO TABS: 10.0000 mg | ORAL_TABLET | Freq: Every day | ORAL | 3 refills | Status: DC

## 2020-10-13 NOTE — Telephone Encounter (Signed)
-----   Message from Jenean Lindau, MD sent at 10/13/2020  4:32 PM EDT ----- Left carotid ultrasound reveals mild to moderate stenosis of the carotid artery as mentioned in the report.  No active intervention.  She would benefit from atorvastatin 10 mg daily and liver lipid check in 6 weeks.  If she has any significant symptoms she should go to the emergency room. ----- Message ----- From: Gita Kudo, RN Sent: 10/13/2020  11:43 AM EDT To: Jenean Lindau, MD  Please review the patients carotid ultrasound as well as her MyChart message. She is very worried about her results and is worried about her symptoms as well. Please advise as soon as possible so I can let her know.   Lilia Pro, RN

## 2020-10-13 NOTE — Telephone Encounter (Signed)
Spoke with patient, verbalized understanding and had no questions at this time.  

## 2020-10-13 NOTE — Telephone Encounter (Signed)
Spoke with patient regarding results and recommendation.  Patient verbalizes understanding and is agreeable to plan of care. Advised patient to call back with any issues or concerns.  

## 2020-10-21 ENCOUNTER — Other Ambulatory Visit: Payer: Self-pay

## 2020-10-21 ENCOUNTER — Ambulatory Visit: Payer: 59 | Admitting: Cardiology

## 2020-10-21 ENCOUNTER — Encounter: Payer: Self-pay | Admitting: Cardiology

## 2020-10-21 VITALS — BP 138/80 | HR 93 | Ht 65.0 in | Wt 168.0 lb

## 2020-10-21 DIAGNOSIS — F419 Anxiety disorder, unspecified: Secondary | ICD-10-CM

## 2020-10-21 DIAGNOSIS — R0789 Other chest pain: Secondary | ICD-10-CM | POA: Diagnosis not present

## 2020-10-21 DIAGNOSIS — R002 Palpitations: Secondary | ICD-10-CM | POA: Diagnosis not present

## 2020-10-21 DIAGNOSIS — G4733 Obstructive sleep apnea (adult) (pediatric): Secondary | ICD-10-CM

## 2020-10-21 DIAGNOSIS — E7800 Pure hypercholesterolemia, unspecified: Secondary | ICD-10-CM | POA: Diagnosis not present

## 2020-10-21 DIAGNOSIS — G451 Carotid artery syndrome (hemispheric): Secondary | ICD-10-CM

## 2020-10-21 NOTE — Progress Notes (Signed)
Cardiology Office Note:    Date:  10/21/2020   ID:  [name], DOB 07-Aug-1960, MRN [id number]  PCP:  Marge Duncans, PA-C  Cardiologist:  Jenne Campus, MD    Referring MD: Marge Duncans, PA-C   Chief Complaint  Patient presents with   Chest Pain   loss balance     L side is loss of motor sensors    History of Present Illness:    Yolanda Sanchez is a 60 y.o. female with past medical history significant for atypical chest pain, stress test initially done was abnormal however after the coronary CT angio showed perfectly normal coronary arteries, also hypertension, dyslipidemia, morbid obesity, obstructive sleep apnea.  Within the last year she lost about 90 pounds and she is feeling much better but she does have a lot of issues after admit this is quite frustrating situation she thinks something is wrong with the left side of her body she did have quite extensive evaluation done so far unrevealing.  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  She said she does have strength on the left side she does not feeling on the left side she being seen neurology so far all tests seems to be unrevealing.  She is scheduled to have a EEG done.  Past Medical History:  Diagnosis Date   Abnormal stress test 11/10/2019   Anxiety    Asthma    Atypical chest pain 09/15/2019   Chest tightness 11/26/2019   Depression    Gait abnormality 11/03/2019   HSV infection    1 & 2    Hypercholesterolemia 09/11/2018   Hypertension    Left facial numbness 10/25/2019   Mixed hyperlipidemia 11/03/2019   Need for prophylactic vaccination and inoculation against influenza 03/01/2020   Need for tetanus, diphtheria, and acellular pertussis (Tdap) vaccine 03/01/2020   Obstructive sleep apnea 09/11/2018   Palpitations 09/15/2019   Paresthesia 11/03/2019   Swelling of both lower extremities 09/11/2018    Past  Surgical History:  Procedure Laterality Date   BREAST BIOPSY Left    CHOLECYSTECTOMY     COLONOSCOPY     TUBAL LIGATION      Current Medications: Current Meds  Medication Sig   albuterol (VENTOLIN HFA) 108 (90 Base) MCG/ACT inhaler INHALE 2 PUFF EVERY 4 TO 6 HOURS AS NEEDED FOR COUGH OR WHEEZE (Patient taking differently: Inhale 2 puffs into the lungs every 6 (six) hours as needed for wheezing or shortness of breath.)   aspirin EC 81 MG tablet Take 1 tablet (81 mg total) by mouth daily. Swallow whole.   atorvastatin (LIPITOR) 10 MG tablet Take 1 tablet (10 mg total) by mouth daily.   buPROPion (WELLBUTRIN XL) 300 MG 24 hr tablet TAKE 1 TABLET BY MOUTH DAILY (Patient taking differently: Take 300 mg by mouth daily.)   doxycycline (VIBRAMYCIN) 50 MG capsule Take 1 capsule (50 mg total) by mouth daily.   famotidine (PEPCID) 40 MG tablet TAKE 1 TABLET BY MOUTH EVERY EVENING (Patient taking differently: Take 40 mg by mouth at bedtime.)   fluticasone (FLONASE) 50 MCG/ACT nasal spray USE 2 SPRAYS INTO BOTH NOSTRILS DAILY ASNEEDED FOR ALLERGIES OR RHINITIS (Patient taking differently: Place 1 spray into both nostrils daily.)   lisinopril-hydrochlorothiazide (ZESTORETIC) 10-12.5 MG tablet Take 0.5 tablets by mouth daily.   meloxicam (MOBIC) 7.5 MG tablet Take 7.5 mg by mouth daily. As needed.  metroNIDAZOLE (METROCREAM) 0.75 % cream Apply topically 2 (two) times daily. (Patient taking differently: Apply 1 application topically 2 (two) times daily.)   montelukast (SINGULAIR) 10 MG tablet Take 1 tablet (10 mg total) by mouth at bedtime.   mupirocin ointment (BACTROBAN) 2 % APPLY 1 APPLICATION TOPICALLY 2 TIMES DAILY (Patient taking differently: Apply 1 application topically 2 (two) times daily.)   omeprazole (PRILOSEC) 40 MG capsule TAKE 1 CAPSULE BY MOUTH IN THE MORNING (Patient taking differently: Take 40 mg by mouth daily.)   valACYclovir (VALTREX) 1000 MG tablet TAKE 1 TABLET BY MOUTH DAILY (Patient  taking differently: Take 1,000 mg by mouth daily.)     Allergies:   Ranexa [ranolazine]   Social History   Socioeconomic History   Marital status: Divorced    Spouse name: Not on file   Number of children: 2   Years of education: Not on file   Highest education level: Not on file  Occupational History   Not on file  Tobacco Use   Smoking status: Former    Types: Cigarettes    Quit date: 2019    Years since quitting: 3.6   Smokeless tobacco: Never   Tobacco comments:    on and off for 20 years   Vaping Use   Vaping Use: Former  Substance and Sexual Activity   Alcohol use: Yes    Alcohol/week: 1.0 - 2.0 standard drink    Types: 1 - 2 Standard drinks or equivalent per week   Drug use: Yes    Types: Marijuana    Comment: at night for insomnia sometimes    Sexual activity: Not on file  Other Topics Concern   Not on file  Social History Narrative   Lives alone    Right handed   Caffeine: 1 cup/day   Social Determinants of Health   Financial Resource Strain: Not on file  Food Insecurity: Not on file  Transportation Needs: Not on file  Physical Activity: Not on file  Stress: Not on file  Social Connections: Not on file     Family History: The patient's family history includes Atrial fibrillation in her sister; Colon cancer in her paternal grandfather; Dementia in her paternal grandmother; Depression in her mother; Diabetes in her brother, brother, and son; Heart attack in her maternal grandfather and maternal grandmother; Heart disease in her maternal grandmother; Heart failure in her sister; Hyperlipidemia in her brother, father, and mother; Hypertension in her brother, brother, and mother; Myelodysplastic syndrome in her brother; Skin cancer in her father; Sleep apnea in her brother. There is no history of Allergic rhinitis, Angioedema, Asthma, Atopy, Eczema, Immunodeficiency, or Urticaria. ROS:   Please see the history of present illness.    All 14 point review of  systems negative except as described per history of present illness  EKGs/Labs/Other Studies Reviewed:      Recent Labs: 08/30/2020: TSH 1.230 09/03/2020: ALT 18; BUN 22; Creatinine, Ser 0.71; Hemoglobin 13.0; Platelets 188; Potassium 3.9; Sodium 135  Recent Lipid Panel    Component Value Date/Time   CHOL 134 08/30/2020 0851   TRIG 56 08/30/2020 0851   HDL 44 08/30/2020 0851   CHOLHDL 3.0 08/30/2020 0851   LDLCALC 78 08/30/2020 0851    Physical Exam:    VS:  BP 138/80 (BP Location: Right Arm, Patient Position: Sitting)   Pulse 93   Ht '5\' 5"'$  (1.651 m)   Wt 168 lb (76.2 kg)   SpO2 93%   BMI 27.96 kg/m  Wt Readings from Last 3 Encounters:  10/21/20 168 lb (76.2 kg)  10/03/20 171 lb 8 oz (77.8 kg)  09/08/20 177 lb (80.3 kg)     GEN:  Well nourished, well developed in no acute distress HEENT: Normal NECK: No JVD; No carotid bruits LYMPHATICS: No lymphadenopathy CARDIAC: RRR, no murmurs, no rubs, no gallops RESPIRATORY:  Clear to auscultation without rales, wheezing or rhonchi  ABDOMEN: Soft, non-tender, non-distended MUSCULOSKELETAL:  No edema; No deformity  SKIN: Warm and dry LOWER EXTREMITIES: no swelling NEUROLOGIC:  Alert and oriented x 3 PSYCHIATRIC:  Normal affect   ASSESSMENT:    1. Atypical chest pain   2. Palpitations   3. Hypercholesterolemia   4. Anxiety   5. Obstructive sleep apnea    PLAN:    In order of problems listed above:  Atypical chest pain denies having any coronary CT angio normal Palpitations I did review her monitor which showed only some extrasystole otherwise no problems Dyslipidemia she is taking Lipitor which advised him to continue with LDL of 78 HDL 44 this is from 08/30/2020. Obstructive sleep apnea she lost significant amount of weight does not have a problem in the morning Anxiety may be contributing to this symptomatology she is absolutely convinced there is a problem with the left side of her body including neck.  I will  schedule her to have neck CT angio as well as brain CT angio make sure were not missing any intra cranial or neck pathology however ultrasounds of the neck showed only up to 60% narrowing.   Medication Adjustments/Labs and Tests Ordered: Current medicines are reviewed at length with the patient today.  Concerns regarding medicines are outlined above.  No orders of the defined types were placed in this encounter.  Medication changes: No orders of the defined types were placed in this encounter.   Signed, Park Liter, MD, [facility] 10/21/2020 3:37 PM    [place]

## 2020-10-21 NOTE — Patient Instructions (Addendum)
Medication Instructions:  Your physician recommends that you continue on your current medications as directed. Please refer to the Current Medication list given to you today.  *If you need a refill on your cardiac medications before your next appointment, please call your pharmacy*   Lab Work: Your physician recommends that you return for lab work in:  3-7 days before CT scan:  BMET If you have labs (blood work) drawn today and your tests are completely normal, you will receive your results only by: [place] (if you have MyChart) OR A paper copy in the mail If you have any lab test that is abnormal or we need to change your treatment, we will call you to review the results.   Testing/Procedures: Non-Cardiac CT Angiography (CTA), is a special type of CT scan that uses a computer to produce multi-dimensional views of major blood vessels throughout the body. In CT angiography, a contrast material is injected through an IV to help visualize the blood vessels    Follow-Up: At [facility], you and your health needs are our priority.  As part of our continuing mission to provide you with exceptional heart care, we have created designated Provider Care Teams.  These Care Teams include your primary Cardiologist (physician) and Advanced Practice Providers (APPs -  Physician Assistants and Nurse Practitioners) who all work together to provide you with the care you need, when you need it.  We recommend signing up for the patient portal called "MyChart".  Sign up information is provided on this After Visit Summary.  MyChart is used to connect with patients for Virtual Visits (Telemedicine).  Patients are able to view lab/test results, encounter notes, upcoming appointments, etc.  Non-urgent messages can be sent to your provider as well.   To learn more about what you can do with MyChart, go to [email or web address].    Your next appointment:   5 month(s)  The format for your next  appointment:   In Person  Provider:   Jenne Campus, MD   Other Instructions CT Angiogram A CT angiogram is a procedure to look at the blood vessels in various areas of the body. For this procedure, a large X-ray machine, called a CT scanner, takesdetailed pictures of blood vessels that have been injected with a contrast dye. A CT angiogram allows your health care provider to see how well blood is flowing to the area of your body that is being checked. Your health careprovider will be able to see if there are any problems, such as a blockage. Tell a health care provider about: Any allergies you have. This is especially important if you have had a previous allergic reaction to contrast dye. All medicines you are taking, including vitamins, herbs, eye drops, creams, and over-the-counter medicines. Any problems you or family members have had with anesthetic medicines. Any blood disorders you have. Any surgeries you have had. Any medical conditions you have. Whether you are pregnant or may be pregnant. Whether you are breastfeeding. Any anxiety disorders, long-term (chronic) pain, or other conditions you have that may increase your stress or prevent you from lying still. What are the risks? Generally, this is a safe procedure. However, problems may occur, including: Infection. Bleeding. Allergic reactions to medicines or dyes. Damage to other structures or organs. This may include kidney damage from the contrast dye that is used. Increased risk of cancer from radiation exposure. This risk is low. Talk with your health care provider about: The risks and benefits of testing. How  you can receive the lowest dose of radiation. What happens before the procedure? Wear comfortable clothing and remove any jewelry, glasses, dentures, and hearing aids. Follow instructions from your health care provider about eating and drinking. These may include: For 12 hours before the procedure, avoid  caffeine. Sources of caffeine include tea, coffee, soda, and energy drinks or energy pills. For 4-6 hours before the procedure, stop eating or drinking anything but water. The contrast dye can cause nausea, but this is less likely if your stomach is empty. Stay well hydrated by continuing to drink water. This will help to clear the contrast dye from your body after the procedure. Ask your health care provider about changing or stopping your regular medicines. This is especially important if you are taking diabetes medicines or blood thinners. What happens during the procedure?  An IV will be inserted into one of your veins. You will be asked to lie on an exam table. This table will slide in and out of the CT machine during the procedure. Contrast dye will be injected into the IV. You might feel warm, or you may get a metallic taste in your mouth. The table that you are lying on will move into the CT machine tunnel for the scan. The person running the machine will give you instructions while the scans are being done. You may be asked to: Keep your arms above your head. Hold your breath. Stay very still, even if the table is moving. When the scanning is complete, you will be moved out of the machine. The IV will be removed. The procedure may vary among health care providers and hospitals. What can I expect after the procedure? After your procedure, it is common to have: A metallic taste in your mouth from the contrast dye. A feeling of warmth. Follow these instructions at home: If told, drink enough fluid to keep your urine pale yellow. This will help to flush the contrast dye out of your body. Take over-the-counter and prescription medicines only as told by your health care provider. Most people can return to their normal activities right after the procedure. Ask your health care provider what activities are safe for you. It is up to you to get the results of your procedure. Ask your health  care provider, or the department that is doing the procedure, when your results will be ready. Keep all follow-up visits as told by your health care provider. This is important. Contact a health care provider if: You have any symptoms of allergy to the contrast dye. These include: Shortness of breath. Rash or hives. A racing heartbeat. Summary A CT angiogram is a procedure to look at the blood vessels in various areas of the body. You may be asked to stay very still during the procedure, even if the exam table is moving. If told, drink enough fluid to keep your urine pale yellow after the procedure. This will help to flush the contrast dye out of your body. This information is not intended to replace advice given to you by your health care provider. Make sure you discuss any questions you have with your healthcare provider. Document Revised: 12/30/2018 Document Reviewed: 12/30/2018 Elsevier Patient Education  [place].

## 2020-10-25 ENCOUNTER — Other Ambulatory Visit: Payer: Self-pay | Admitting: Physician Assistant

## 2020-10-25 ENCOUNTER — Other Ambulatory Visit: Payer: Self-pay | Admitting: Allergy and Immunology

## 2020-10-26 ENCOUNTER — Telehealth: Payer: Self-pay

## 2020-10-26 LAB — BASIC METABOLIC PANEL
BUN/Creatinine Ratio: 28 — ABNORMAL HIGH (ref 9–23)
BUN: 24 mg/dL (ref 6–24)
CO2: 27 mmol/L (ref 20–29)
Calcium: 9.5 mg/dL (ref 8.7–10.2)
Chloride: 100 mmol/L (ref 96–106)
Creatinine, Ser: 0.87 mg/dL (ref 0.57–1.00)
Glucose: 77 mg/dL (ref 65–99)
Potassium: 4.3 mmol/L (ref 3.5–5.2)
Sodium: 139 mmol/L (ref 134–144)
eGFR: 77 mL/min/{1.73_m2} (ref >59)

## 2020-10-26 NOTE — Telephone Encounter (Signed)
Spoke with patient regarding results and recommendation.  Patient verbalizes understanding and is agreeable to plan of care. Advised patient to call back with any issues or concerns.  

## 2020-10-26 NOTE — Telephone Encounter (Signed)
-----   Message from Park Liter, MD sent at 10/26/2020  3:54 PM EDT ----- Chem-7 looks good

## 2020-10-27 ENCOUNTER — Other Ambulatory Visit: Payer: Self-pay | Admitting: Allergy and Immunology

## 2020-10-28 ENCOUNTER — Other Ambulatory Visit: Payer: Self-pay

## 2020-10-28 ENCOUNTER — Ambulatory Visit (HOSPITAL_BASED_OUTPATIENT_CLINIC_OR_DEPARTMENT_OTHER)
Admission: RE | Admit: 2020-10-28 | Discharge: 2020-10-28 | Disposition: A | Discharge status: Home / Self Care | Payer: 59 | Source: Ambulatory Visit | Attending: Cardiology | Admitting: Cardiology

## 2020-10-28 DIAGNOSIS — G451 Carotid artery syndrome (hemispheric): Secondary | ICD-10-CM | POA: Diagnosis not present

## 2020-10-28 MED ORDER — IOHEXOL 350 MG/ML SOLN
100.0000 mL | Freq: Once | INTRAVENOUS | Status: AC | PRN
  Administered 2020-10-28: 100 mL via INTRAVENOUS

## 2020-11-01 ENCOUNTER — Ambulatory Visit: Payer: 59

## 2020-11-01 ENCOUNTER — Other Ambulatory Visit: Payer: Self-pay

## 2020-11-01 DIAGNOSIS — R55 Syncope and collapse: Secondary | ICD-10-CM

## 2020-11-09 NOTE — Procedures (Signed)
   HISTORY: 60 years old female, complains of passing out episode  TECHNIQUE:  This is a routine 16 channel EEG recording with one channel devoted to a limited EKG recording.  It was performed during wakefulness, drowsiness and asleep.  Hyperventilation and photic stimulation were performed as activating procedures.  There are minimum muscle and movement artifact noted.  Upon maximum arousal, posterior dominant waking rhythm consistent of rhythmic alpha range activity, with frequency of 10 Hz. Activities are symmetric over the bilateral posterior derivations and attenuated with eye opening.  Hyperventilation produced mild/moderate buildup with higher amplitude and the slower activities noted.  Photic stimulation did not alter the tracing.  During EEG recording, patient developed drowsiness and no deeper stage of sleep was achieved  During EEG recording, there was no epileptiform discharge noted.  EKG demonstrate sinus rhythm, with heart rate of 84 bpm  CONCLUSION: This is a  normal awake EEG.  There is no electrodiagnostic evidence of epileptiform discharge.  Marcial Pacas, M.D. Ph.D.  [facility] [place], [place] [address] Phone: [phone] Fax:      [phone]

## 2020-11-14 ENCOUNTER — Encounter: Payer: Self-pay | Admitting: Physician Assistant

## 2020-11-15 ENCOUNTER — Encounter: Payer: Self-pay | Admitting: Physician Assistant

## 2020-11-15 ENCOUNTER — Ambulatory Visit: Payer: 59 | Admitting: Physician Assistant

## 2020-11-15 ENCOUNTER — Other Ambulatory Visit: Payer: Self-pay

## 2020-11-15 VITALS — BP 118/74 | HR 88 | Temp 97.5°F | Ht 65.0 in | Wt 174.8 lb

## 2020-11-15 DIAGNOSIS — F3289 Other specified depressive episodes: Secondary | ICD-10-CM

## 2020-11-15 DIAGNOSIS — Z23 Encounter for immunization: Secondary | ICD-10-CM

## 2020-11-15 DIAGNOSIS — R5382 Chronic fatigue, unspecified: Secondary | ICD-10-CM | POA: Diagnosis not present

## 2020-11-15 MED ORDER — DULOXETINE HCL 30 MG PO CPEP: 30.0000 mg | ORAL_CAPSULE | Freq: Every day | ORAL | 1 refills | Status: DC

## 2020-11-15 NOTE — Progress Notes (Signed)
Acute Office Visit  Subjective:    Patient ID: Yolanda Sanchez, female    DOB: 11-21-1960, 60 y.o.   MRN: [id number]  Chief Complaint  Patient presents with   Fatigue    HPI Patient is in today for chronic fatigue - pt states that for the past year she has had continued fatigue as well as neurologic symptoms including intermittent paresthesias and chronic headaches Pt has had a negative cardiology workup and has seen two neurologists regarding symptoms - testing has been normal States she is starting to lose desire in normal activities and having trouble concentrating as well She is currently on wellbutrin for depression Pt states she would like checked for lyme disease - no known history of tick exposure but concern is noted with her chronic symptoms  Past Medical History:  Diagnosis Date   Abnormal stress test 11/10/2019   Anxiety    Asthma    Atypical chest pain 09/15/2019   Chest tightness 11/26/2019   Depression    Gait abnormality 11/03/2019   HSV infection    1 & 2    Hypercholesterolemia 09/11/2018   Hypertension    Left facial numbness 10/25/2019   Mixed hyperlipidemia 11/03/2019   Need for prophylactic vaccination and inoculation against influenza 03/01/2020   Need for tetanus, diphtheria, and acellular pertussis (Tdap) vaccine 03/01/2020   Obstructive sleep apnea 09/11/2018   Palpitations 09/15/2019   Paresthesia 11/03/2019   Swelling of both lower extremities 09/11/2018    Past Surgical History:  Procedure Laterality Date   BREAST BIOPSY Left    CHOLECYSTECTOMY     COLONOSCOPY     TUBAL LIGATION      Family History  Problem Relation Age of Onset   Hyperlipidemia Mother    Hypertension Mother    Depression Mother    Hyperlipidemia Father    Skin cancer Father    Hypertension Brother    Diabetes Brother    Myelodysplastic syndrome Brother    Heart attack Maternal Grandmother    Heart disease Maternal Grandmother    Heart attack Maternal Grandfather     Dementia Paternal Grandmother    Colon cancer Paternal Grandfather    Hypertension Brother    Hyperlipidemia Brother    Sleep apnea Brother    Diabetes Brother    Diabetes Son    Heart failure Sister    Atrial fibrillation Sister    Allergic rhinitis Neg Hx    Angioedema Neg Hx    Asthma Neg Hx    Atopy Neg Hx    Eczema Neg Hx    Immunodeficiency Neg Hx    Urticaria Neg Hx     Social History   Socioeconomic History   Marital status: Divorced    Spouse name: Not on file   Number of children: 2   Years of education: Not on file   Highest education level: Not on file  Occupational History   Not on file  Tobacco Use   Smoking status: Former    Types: Cigarettes    Quit date: 2019    Years since quitting: 3.6   Smokeless tobacco: Never   Tobacco comments:    on and off for 20 years   Vaping Use   Vaping Use: Former  Substance and Sexual Activity   Alcohol use: Yes    Alcohol/week: 1.0 - 2.0 standard drink    Types: 1 - 2 Standard drinks or equivalent per week   Drug use: Yes    Types:  Marijuana    Comment: at night for insomnia sometimes    Sexual activity: Not on file  Other Topics Concern   Not on file  Social History Narrative   Lives alone    Right handed   Caffeine: 1 cup/day   Social Determinants of Health   Financial Resource Strain: Not on file  Food Insecurity: Not on file  Transportation Needs: Not on file  Physical Activity: Not on file  Stress: Not on file  Social Connections: Not on file  Intimate Partner Violence: Not on file    Outpatient Medications Prior to Visit  Medication Sig Dispense Refill   albuterol (VENTOLIN HFA) 108 (90 Base) MCG/ACT inhaler INHALE 2 PUFF EVERY 4 TO 6 HOURS AS NEEDED FOR COUGH OR WHEEZE 8.5 g 0   aspirin EC 81 MG tablet Take 1 tablet (81 mg total) by mouth daily. Swallow whole. 90 tablet 3   atorvastatin (LIPITOR) 10 MG tablet Take 1 tablet (10 mg total) by mouth daily. 90 tablet 3   buPROPion (WELLBUTRIN XL)  300 MG 24 hr tablet TAKE 1 TABLET BY MOUTH DAILY 90 tablet 0   doxycycline (VIBRAMYCIN) 50 MG capsule TAKE 1 CAPSULE BY MOUTH DAILY 30 capsule 0   famotidine (PEPCID) 40 MG tablet TAKE 1 TABLET BY MOUTH EVERY EVENING 30 tablet 0   fluticasone (FLONASE) 50 MCG/ACT nasal spray USE 2 SPRAYS INTO BOTH NOSTRILS DAILY ASNEEDED FOR ALLERGIES OR RHINITIS (Patient taking differently: Place 1 spray into both nostrils daily.) 16 g 2   lisinopril-hydrochlorothiazide (ZESTORETIC) 10-12.5 MG tablet Take 0.5 tablets by mouth daily. 45 tablet 3   meloxicam (MOBIC) 7.5 MG tablet Take 7.5 mg by mouth daily. As needed.     metroNIDAZOLE (METROCREAM) 0.75 % cream Apply topically 2 (two) times daily. (Patient taking differently: Apply 1 application topically 2 (two) times daily.) 45 g 5   montelukast (SINGULAIR) 10 MG tablet Take 1 tablet (10 mg total) by mouth at bedtime. 30 tablet 5   omeprazole (PRILOSEC) 40 MG capsule TAKE 1 CAPSULE BY MOUTH IN THE MORNING (Patient taking differently: Take 40 mg by mouth daily.) 30 capsule 3   valACYclovir (VALTREX) 1000 MG tablet TAKE 1 TABLET BY MOUTH DAILY 30 tablet 5   mupirocin ointment (BACTROBAN) 2 % APPLY 1 APPLICATION TOPICALLY 2 TIMES DAILY (Patient taking differently: Apply 1 application topically 2 (two) times daily.) 22 g 0   No facility-administered medications prior to visit.    Allergies  Allergen Reactions   Ranexa [Ranolazine]     10-20-19    Review of Systems CONSTITUTIONAL: see HPI CARDIOVASCULAR: Negative for chest pain, dizziness, palpitations and pedal edema.  RESPIRATORY: Negative for recent cough and dyspnea.  MSK: see HPI INTEGUMENTARY: Negative for rash.  NEUROLOGICAL: see HPI PSYCHIATRIC: see HPI        Objective:    Physical Exam PHYSICAL EXAM:   VS: BP 118/74 (BP Location: Left Arm, Patient Position: Sitting, Cuff Size: Normal)   Pulse 88   Temp (!) 97.5 F (36.4 C) (Temporal)   Ht _0  (1.651 m)   Wt 174 lb 12.8 oz (79.3 kg)    SpO2 96%   BMI 29.09 kg/m   GEN: Well nourished, well developed, in no acute distress  Cardiac: RRR; no murmurs, rubs, or gallops,no edema - Respiratory:  normal respiratory rate and pattern with no distress - normal breath sounds with no rales, rhonchi, wheezes or rubs MS: no deformity or atrophy  Skin: warm and dry, no  rash  Psych: euthymic mood, appropriate affect and demeanor  BP 118/74 (BP Location: Left Arm, Patient Position: Sitting, Cuff Size: Normal)   Pulse 88   Temp (!) 97.5 F (36.4 C) (Temporal)   Ht _0  (1.651 m)   Wt 174 lb 12.8 oz (79.3 kg)   SpO2 96%   BMI 29.09 kg/m  Wt Readings from Last 3 Encounters:  11/15/20 174 lb 12.8 oz (79.3 kg)  10/21/20 168 lb (76.2 kg)  10/03/20 171 lb 8 oz (77.8 kg)    Health Maintenance Due  Topic Date Due   Zoster Vaccines- Shingrix (1 of 2) Never done   COLONOSCOPY (Pts 45-58yr Insurance coverage will need to be confirmed)  Never done   PAP SMEAR-Modifier  06/30/2017    There are no preventive care reminders to display for this patient.   Lab Results  Component Value Date   TSH 1.230 08/30/2020   Lab Results  Component Value Date   WBC 7.3 09/03/2020   HGB 13.0 09/03/2020   HCT 37.5 09/03/2020   MCV 94.7 09/03/2020   PLT 188 09/03/2020   Lab Results  Component Value Date   NA 139 10/25/2020   K 4.3 10/25/2020   CO2 27 10/25/2020   GLUCOSE 77 10/25/2020   BUN 24 10/25/2020   CREATININE 0.87 10/25/2020   BILITOT 0.3 09/03/2020   ALKPHOS 50 09/03/2020   AST 21 09/03/2020   ALT 18 09/03/2020   PROT 7.2 09/03/2020   ALBUMIN 3.9 09/03/2020   CALCIUM 9.5 10/25/2020   ANIONGAP 6 09/03/2020   EGFR 77 10/25/2020   Lab Results  Component Value Date   CHOL 134 08/30/2020   Lab Results  Component Value Date   HDL 44 08/30/2020   Lab Results  Component Value Date   LDLCALC 78 08/30/2020   Lab Results  Component Value Date   TRIG 56 08/30/2020   Lab Results  Component Value Date   CHOLHDL 3.0  08/30/2020   No results found for: HGBA1C     Assessment & Plan:  1. Chronic fatigue - CBC with Differential/Platelet - Comprehensive metabolic panel - TSH - Lyme Disease Serology w/Reflex - DULoxetine (CYMBALTA) 30 MG capsule; Take 1 capsule (30 mg total) by mouth daily.  Dispense: 30 capsule; Refill: 1  2. Other depression - DULoxetine (CYMBALTA) 30 MG capsule; Take 1 capsule (30 mg total) by mouth daily.  Dispense: 30 capsule; Refill: 1  3. Need for prophylactic vaccination and inoculation against influenza - Flu Vaccine MDCK QUAD PF    Meds ordered this encounter  Medications   DULoxetine (CYMBALTA) 30 MG capsule    Sig: Take 1 capsule (30 mg total) by mouth daily.    Dispense:  30 capsule    Refill:  1    Order Specific Question:   Supervising Provider    Answer:Shelton Silvas   Orders Placed This Encounter  Procedures   Flu Vaccine MDCK QUAD PF   CBC with Differential/Platelet   Comprehensive metabolic panel   TSH   Lyme Disease Serology w/Reflex      Follow-up: Return in about 4 weeks (around 12/13/2020) for follow up  - 30 min.  An After Visit Summary was printed and given to the patient.  [facility] [phone]

## 2020-11-16 LAB — CBC WITH DIFFERENTIAL/PLATELET
Basophils Absolute: 0.1 10*3/uL (ref 0.0–0.2)
Basos: 1 %
EOS (ABSOLUTE): 0.1 10*3/uL (ref 0.0–0.4)
Eos: 1 %
Hematocrit: 40.8 % (ref 34.0–46.6)
Hemoglobin: 13.7 g/dL (ref 11.1–15.9)
Immature Grans (Abs): 0 10*3/uL (ref 0.0–0.1)
Immature Granulocytes: 0 %
Lymphocytes Absolute: 2 10*3/uL (ref 0.7–3.1)
Lymphs: 29 %
MCH: 31.9 pg (ref 26.6–33.0)
MCHC: 33.6 g/dL (ref 31.5–35.7)
MCV: 95 fL (ref 79–97)
Monocytes Absolute: 0.4 10*3/uL (ref 0.1–0.9)
Monocytes: 7 %
Neutrophils Absolute: 4.3 10*3/uL (ref 1.4–7.0)
Neutrophils: 62 %
Platelets: 188 10*3/uL (ref 150–450)
RBC: 4.29 x10E6/uL (ref 3.77–5.28)
RDW: 12.7 % (ref 11.7–15.4)
WBC: 6.8 10*3/uL (ref 3.4–10.8)

## 2020-11-16 LAB — COMPREHENSIVE METABOLIC PANEL
ALT: 28 IU/L (ref 0–32)
AST: 26 IU/L (ref 0–40)
Albumin/Globulin Ratio: 1.7 (ref 1.2–2.2)
Albumin: 4.4 g/dL (ref 3.8–4.9)
Alkaline Phosphatase: 53 IU/L (ref 44–121)
BUN/Creatinine Ratio: 26 — ABNORMAL HIGH (ref 9–23)
BUN: 20 mg/dL (ref 6–24)
Bilirubin Total: 0.2 mg/dL (ref 0.0–1.2)
CO2: 22 mmol/L (ref 20–29)
Calcium: 9.4 mg/dL (ref 8.7–10.2)
Chloride: 103 mmol/L (ref 96–106)
Creatinine, Ser: 0.77 mg/dL (ref 0.57–1.00)
Globulin, Total: 2.6 g/dL (ref 1.5–4.5)
Glucose: 79 mg/dL (ref 65–99)
Potassium: 4.1 mmol/L (ref 3.5–5.2)
Sodium: 141 mmol/L (ref 134–144)
Total Protein: 7 g/dL (ref 6.0–8.5)
eGFR: 89 mL/min/{1.73_m2} (ref >59)

## 2020-11-16 LAB — TSH: TSH: 2.15 u[IU]/mL (ref 0.450–4.500)

## 2020-11-16 LAB — LYME DISEASE SEROLOGY W/REFLEX: Lyme Total Antibody EIA: NEGATIVE

## 2020-11-17 ENCOUNTER — Ambulatory Visit
Admission: RE | Admit: 2020-11-17 | Discharge: 2020-11-17 | Disposition: A | Discharge status: Home / Self Care | Payer: 59 | Source: Ambulatory Visit | Attending: Physician Assistant | Admitting: Physician Assistant

## 2020-11-17 ENCOUNTER — Other Ambulatory Visit: Payer: Self-pay

## 2020-11-17 DIAGNOSIS — N6310 Unspecified lump in the right breast, unspecified quadrant: Secondary | ICD-10-CM

## 2020-11-18 NOTE — Telephone Encounter (Signed)
Called pt. Made pt aware of Sally's advisement. Pt will fu. Also sees allergist next week.   Royce Macadamia, [place] 11/18/20 11:02 AM

## 2020-11-22 ENCOUNTER — Other Ambulatory Visit: Payer: Self-pay | Admitting: Physician Assistant

## 2020-11-22 ENCOUNTER — Other Ambulatory Visit: Payer: Self-pay | Admitting: Allergy and Immunology

## 2020-11-24 ENCOUNTER — Ambulatory Visit: Payer: 59 | Admitting: Allergy and Immunology

## 2020-11-24 ENCOUNTER — Other Ambulatory Visit: Payer: Self-pay

## 2020-11-24 ENCOUNTER — Other Ambulatory Visit: Payer: Self-pay | Admitting: Allergy and Immunology

## 2020-11-24 ENCOUNTER — Encounter: Payer: Self-pay | Admitting: Allergy and Immunology

## 2020-11-24 VITALS — BP 132/80 | HR 88 | Resp 16

## 2020-11-24 DIAGNOSIS — L719 Rosacea, unspecified: Secondary | ICD-10-CM

## 2020-11-24 DIAGNOSIS — K219 Gastro-esophageal reflux disease without esophagitis: Secondary | ICD-10-CM | POA: Diagnosis not present

## 2020-11-24 DIAGNOSIS — J3089 Other allergic rhinitis: Secondary | ICD-10-CM | POA: Diagnosis not present

## 2020-11-24 DIAGNOSIS — J4530 Mild persistent asthma, uncomplicated: Secondary | ICD-10-CM | POA: Diagnosis not present

## 2020-11-24 NOTE — Progress Notes (Signed)
Yolanda Sanchez   Follow-up Note  Referring Provider: Marge Duncans, PA-C Primary Provider: Marge Duncans, Hershal Coria Date of Office Visit: 11/24/2020  Subjective:   Yolanda Sanchez (DOB: Dec 19, 1960) is a 60 y.o. female who returns to the [facility] [place] on 11/24/2020 in re-evaluation of the following:  HPI: [name] returns to this clinic in evaluation of asthma and rhinitis and LPR and rosacea.  I last saw her in this clinic on 12 May 2020.  She has excellent control of her airway issue with aggressive therapy directed against LPR.  As well, she rarely uses a short acting bronchodilator and has not required a systemic steroid or antibiotic for any type of airway issue while using Flonase.  We discontinued her montelukast during her last visit and she has done well without the use of this medication.  Her rosacea is under very good control on her current therapy which includes doxycycline and MetroCream.  She is received 4 COVID vaccinations and the flu vaccine and the Pneumovax.  She apparently had an episode of syncope this past August with a negative diagnostic evaluation by cardiology.  She hit her head and apparently she had a CT scan of her head which identified no significant abnormality.  She has had this sensation of left facial pressure and pain and feeling as though she is lost some sensation on that left side of her face.  She was recently started on Cymbalta for this issue.  Interestingly, she had a similar type of reaction when she was taking Ranexa but this also appeared to involve her left arm and leg.  She has seen a neurologist in 2021 for this issue.  Allergies as of 11/24/2020       Reactions   Ranexa [ranolazine]    10-20-19        Medication List    albuterol 108 (90 Base) MCG/ACT inhaler Commonly known as: VENTOLIN HFA INHALE 2 PUFF EVERY 4 TO 6 HOURS AS NEEDED FOR COUGH OR WHEEZE   aspirin EC 81 MG  tablet Take 1 tablet (81 mg total) by mouth daily. Swallow whole.   atorvastatin 10 MG tablet Commonly known as: LIPITOR Take 1 tablet (10 mg total) by mouth daily.   buPROPion 300 MG 24 hr tablet Commonly known as: WELLBUTRIN XL TAKE 1 TABLET BY MOUTH DAILY   doxycycline 50 MG capsule Commonly known as: VIBRAMYCIN TAKE 1 CAPSULE BY MOUTH DAILY   DULoxetine 30 MG capsule Commonly known as: Cymbalta Take 1 capsule (30 mg total) by mouth daily.   famotidine 40 MG tablet Commonly known as: PEPCID TAKE 1 TABLET BY MOUTH EVERY EVENING   fluticasone 50 MCG/ACT nasal spray Commonly known as: FLONASE USE 2 SPRAYS INTO BOTH NOSTRILS DAILY ASNEEDED FOR ALLERGIES OR RHINITIS   lisinopril-hydrochlorothiazide 10-12.5 MG tablet Commonly known as: ZESTORETIC Take 0.5 tablets by mouth daily.   meloxicam 7.5 MG tablet Commonly known as: MOBIC Take 7.5 mg by mouth daily. As needed.   metroNIDAZOLE 0.75 % cream Commonly known as: METROCREAM Apply topically 2 (two) times daily. What changed: how much to take   montelukast 10 MG tablet Commonly known as: SINGULAIR Take 1 tablet (10 mg total) by mouth at bedtime.   omeprazole 40 MG capsule Commonly known as: PRILOSEC TAKE 1 CAPSULE BY MOUTH IN THE MORNING What changed: when to take this   valACYclovir 1000 MG tablet Commonly known as: VALTREX TAKE 1 TABLET BY MOUTH DAILY  Past Medical History:  Diagnosis Date   Abnormal stress test 11/10/2019   Anxiety    Asthma    Atypical chest pain 09/15/2019   Chest tightness 11/26/2019   Depression    Gait abnormality 11/03/2019   HSV infection    1 & 2    Hypercholesterolemia 09/11/2018   Hypertension    Left facial numbness 10/25/2019   Mixed hyperlipidemia 11/03/2019   Need for prophylactic vaccination and inoculation against influenza 03/01/2020   Need for tetanus, diphtheria, and acellular pertussis (Tdap) vaccine 03/01/2020   Obstructive sleep apnea 09/11/2018   Palpitations  09/15/2019   Paresthesia 11/03/2019   Swelling of both lower extremities 09/11/2018    Past Surgical History:  Procedure Laterality Date   BREAST BIOPSY Left    CHOLECYSTECTOMY     COLONOSCOPY     TUBAL LIGATION      Review of systems negative except as noted in HPI / PMHx or noted below:  Review of Systems  Constitutional: Negative.   HENT: Negative.    Eyes: Negative.   Respiratory: Negative.    Cardiovascular: Negative.   Gastrointestinal: Negative.   Genitourinary: Negative.   Musculoskeletal: Negative.   Skin: Negative.   Neurological: Negative.   Endo/Heme/Allergies: Negative.   Psychiatric/Behavioral: Negative.      Objective:   Vitals:   11/24/20 1651  BP: 132/80  Pulse: 88  Resp: 16  SpO2: 97%          Physical Exam Constitutional:      Appearance: She is not diaphoretic.  HENT:     Head: Normocephalic.     Right Ear: Tympanic membrane, ear canal and external ear normal.     Left Ear: Tympanic membrane, ear canal and external ear normal.     Nose: Nose normal. No mucosal edema or rhinorrhea.     Mouth/Throat:     Pharynx: Uvula midline. No oropharyngeal exudate.  Eyes:     Conjunctiva/sclera: Conjunctivae normal.  Neck:     Thyroid: No thyromegaly.     Trachea: Trachea normal. No tracheal tenderness or tracheal deviation.  Cardiovascular:     Rate and Rhythm: Normal rate and regular rhythm.     Heart sounds: Normal heart sounds, S1 normal and S2 normal. No murmur heard. Pulmonary:     Effort: No respiratory distress.     Breath sounds: Normal breath sounds. No stridor. No wheezing or rales.  Lymphadenopathy:     Head:     Right side of head: No tonsillar adenopathy.     Left side of head: No tonsillar adenopathy.     Cervical: No cervical adenopathy.  Skin:    Findings: No erythema or rash.     Nails: There is no clubbing.  Neurological:     Mental Status: She is alert.    Diagnostics: none  Assessment and Plan:   1. Asthma, well  controlled, mild persistent   2. Perennial allergic rhinitis   3. Rosacea   4. LPRD (laryngopharyngeal reflux disease)     1. Continue to Treat and prevent inflammation:   A. Flonase - 1 spray each nostril 1-2 times per day  2. Treat and prevent reflux / LPR:   A. Minimize all forms of caffeiene  B. Omeprazole 40 mg - 1 tablet in AM  C. Famotidine 40 mg - 1 tablet in PM IF NEEDED  3. If needed:   A. Albuterol HFA - 2 inhalations every 4-6 hours  4. Treat and prevent rosacea:  A.  Metrocream applied to face 2 times per day  B.  Doxycycline 50 mg - 1 time per day   5. Return to clinic in 6 months or earlier if problem  We will now see if we can consolidate Kalynne medication regime by having her use famotidine just as needed while continuing to use omeprazole on a consistent basis to treat her LPR and she can continue on Flonase for upper airway inflammatory condition and continue to utilize therapy directed against rosacea as noted above.  Assuming she does well with this plan I will see her back in this clinic in 6 months or earlier if there is a problem.  She has some form of unusual neurologic issue involving her left face and occasionally left side of her body and this is already been evaluated by neurology and I have encouraged her to follow-up with neurology should this continues to be a problem.  Allena Katz, MD Allergy / [facility] [place]

## 2020-11-24 NOTE — Patient Instructions (Addendum)
  1. Continue to Treat and prevent inflammation:   A. Flonase - 1 spray each nostril 1-2 times per day  2. Treat and prevent reflux / LPR:   A. Minimize all forms of caffeiene  B. Omeprazole 40 mg - 1 tablet in AM  C. Famotidine 40 mg - 1 tablet in PM IF NEEDED  3. If needed:   A. Albuterol HFA - 2 inhalations every 4-6 hours  4. Treat and prevent rosacea:   A.  Metrocream applied to face 2 times per day  B.  Doxycycline 50 mg - 1 time per day   5. Return to clinic in 6 months or earlier if problem

## 2020-11-28 ENCOUNTER — Encounter: Payer: Self-pay | Admitting: Allergy and Immunology

## 2020-12-06 ENCOUNTER — Ambulatory Visit: Payer: 59 | Admitting: Cardiology

## 2020-12-13 ENCOUNTER — Encounter: Payer: Self-pay | Admitting: Physician Assistant

## 2020-12-13 ENCOUNTER — Other Ambulatory Visit: Payer: Self-pay

## 2020-12-13 ENCOUNTER — Ambulatory Visit: Payer: 59 | Admitting: Physician Assistant

## 2020-12-13 VITALS — BP 116/76 | HR 86 | Temp 97.4°F | Ht 65.0 in | Wt 170.4 lb

## 2020-12-13 DIAGNOSIS — R5382 Chronic fatigue, unspecified: Secondary | ICD-10-CM | POA: Diagnosis not present

## 2020-12-13 MED ORDER — DULOXETINE HCL 60 MG PO CPEP
60.0000 mg | ORAL_CAPSULE | Freq: Every day | ORAL | 3 refills | Status: DC
Start: 2020-12-13 — End: 2020-12-15

## 2020-12-13 NOTE — Progress Notes (Signed)
Subjective:  Patient ID: Yolanda Sanchez, female    DOB: [birth year]  Age: 60 y.o. MRN: [id number]  Chief Complaint  Patient presents with   Follow-up   Fibromyalgia    HPI  Pt in today for follow up for what is believed to be fibromyalgia symptoms - she has chronically had fatigue, malaise and an array of symptoms to include facial tingling, headaches, etc - she was given rx for cymbalta 30mg  and she states that actually physically she is feeling quite a bit better and some of her symptoms have resolved - she states that overall she 'functions better' but still having some mild feelings of lack of motivation Current Outpatient Medications on File Prior to Visit  Medication Sig Dispense Refill   albuterol (VENTOLIN HFA) 108 (90 Base) MCG/ACT inhaler INHALE 2 PUFF EVERY 4 TO 6 HOURS AS NEEDED FOR COUGH OR WHEEZE 8.5 g 0   aspirin EC 81 MG tablet Take 1 tablet (81 mg total) by mouth daily. Swallow whole. 90 tablet 3   atorvastatin (LIPITOR) 10 MG tablet Take 1 tablet (10 mg total) by mouth daily. 90 tablet 3   buPROPion (WELLBUTRIN XL) 300 MG 24 hr tablet TAKE 1 TABLET BY MOUTH DAILY 90 tablet 0   doxycycline (VIBRAMYCIN) 50 MG capsule TAKE 1 CAPSULE BY MOUTH DAILY 30 capsule 0   famotidine (PEPCID) 40 MG tablet TAKE 1 TABLET BY MOUTH EVERY EVENING 30 tablet 0   fluticasone (FLONASE) 50 MCG/ACT nasal spray USE 2 SPRAYS INTO BOTH NOSTRILS DAILY ASNEEDED FOR ALLERGIES OR RHINITIS 16 g 2   lisinopril-hydrochlorothiazide (ZESTORETIC) 10-12.5 MG tablet Take 0.5 tablets by mouth daily. 45 tablet 3   meloxicam (MOBIC) 7.5 MG tablet Take 7.5 mg by mouth daily. As needed.     metroNIDAZOLE (METROCREAM) 0.75 % cream Apply topically 2 (two) times daily. (Patient taking differently: Apply 1 application topically 2 (two) times daily.) 45 g 5   omeprazole (PRILOSEC) 40 MG capsule TAKE 1 CAPSULE BY MOUTH IN THE MORNING (Patient taking differently: Take 40 mg by mouth daily.) 30 capsule 3   No current  facility-administered medications on file prior to visit.   Past Medical History:  Diagnosis Date   Abnormal stress test 11/10/2019   Anxiety    Asthma    Atypical chest pain 09/15/2019   Chest tightness 11/26/2019   Depression    Gait abnormality 11/03/2019   HSV infection    1 & 2    Hypercholesterolemia 09/11/2018   Hypertension    Left facial numbness 10/25/2019   Mixed hyperlipidemia 11/03/2019   Need for prophylactic vaccination and inoculation against influenza 03/01/2020   Need for tetanus, diphtheria, and acellular pertussis (Tdap) vaccine 03/01/2020   Obstructive sleep apnea 09/11/2018   Palpitations 09/15/2019   Paresthesia 11/03/2019   Swelling of both lower extremities 09/11/2018   Past Surgical History:  Procedure Laterality Date   BREAST BIOPSY Left    CHOLECYSTECTOMY     COLONOSCOPY     TUBAL LIGATION      Family History  Problem Relation Age of Onset   Hyperlipidemia Mother    Hypertension Mother    Depression Mother    Hyperlipidemia Father    Skin cancer Father    Hypertension Brother    Diabetes Brother    Myelodysplastic syndrome Brother    Heart attack Maternal Grandmother    Heart disease Maternal Grandmother    Heart attack Maternal Grandfather    Dementia Paternal Grandmother  Colon cancer Paternal Grandfather    Hypertension Brother    Hyperlipidemia Brother    Sleep apnea Brother    Diabetes Brother    Diabetes Son    Heart failure Sister    Atrial fibrillation Sister    Allergic rhinitis Neg Hx    Angioedema Neg Hx    Asthma Neg Hx    Atopy Neg Hx    Eczema Neg Hx    Immunodeficiency Neg Hx    Urticaria Neg Hx    Social History   Socioeconomic History   Marital status: Divorced    Spouse name: Not on file   Number of children: 2   Years of education: Not on file   Highest education level: Not on file  Occupational History   Not on file  Tobacco Use   Smoking status: Former    Types: Cigarettes    Quit date: 2019    Years since  quitting: 3.7   Smokeless tobacco: Never   Tobacco comments:    on and off for 20 years   Vaping Use   Vaping Use: Former  Substance and Sexual Activity   Alcohol use: Yes    Alcohol/week: 1.0 - 2.0 standard drink    Types: 1 - 2 Standard drinks or equivalent per week   Drug use: Yes    Types: Marijuana    Comment: at night for insomnia sometimes    Sexual activity: Not on file  Other Topics Concern   Not on file  Social History Narrative   Lives alone    Right handed   Caffeine: 1 cup/day   Social Determinants of Health   Financial Resource Strain: Not on file  Food Insecurity: Not on file  Transportation Needs: Not on file  Physical Activity: Not on file  Stress: Not on file  Social Connections: Not on file    Review of Systems  CONSTITUTIONAL: Negative for chills, fatigue, fever, unintentional weight gain and unintentional weight loss.  CARDIOVASCULAR: Negative for chest pain, dizziness, palpitations and pedal edema.  RESPIRATORY: Negative for recent cough and dyspnea.  MSK: Negative for arthralgias and myalgias.  NEUROLOGICAL: Negative for dizziness and headaches.  PSYCHIATRIC: see HPI     Objective:  BP 116/76 (BP Location: Left Arm, Patient Position: Sitting, Cuff Size: Normal)   Pulse 86   Temp (!) 97.4 F (36.3 C) (Temporal)   Ht 5\' 5"  (1.651 m)   Wt 170 lb 6.4 oz (77.3 kg)   SpO2 98%   BMI 28.36 kg/m   BP/Weight 12/13/2020 7/35/3299 04/15/2681  Systolic BP 419 622 297  Diastolic BP 76 80 74  Wt. (Lbs) 170.4 - 174.8  BMI 28.36 - 29.09    Physical Exam PHYSICAL EXAM:   VS: BP 116/76 (BP Location: Left Arm, Patient Position: Sitting, Cuff Size: Normal)   Pulse 86   Temp (!) 97.4 F (36.3 C) (Temporal)   Ht 5\' 5"  (1.651 m)   Wt 170 lb 6.4 oz (77.3 kg)   SpO2 98%   BMI 28.36 kg/m   GEN: Well nourished, well developed, in no acute distress  Cardiac: RRR; no murmurs, rubs, or gallops,no edema -  Respiratory:  normal respiratory rate and pattern  with no distress - normal breath sounds with no rales, rhonchi, wheezes or rubs Psych: euthymic mood, appropriate affect and demeanor  Diabetic Foot Exam - Simple   No data filed      Lab Results  Component Value Date   WBC 6.8 11/15/2020  HGB 13.7 11/15/2020   HCT 40.8 11/15/2020   PLT 188 11/15/2020   GLUCOSE 79 11/15/2020   CHOL 134 08/30/2020   TRIG 56 08/30/2020   HDL 44 08/30/2020   LDLCALC 78 08/30/2020   ALT 28 11/15/2020   AST 26 11/15/2020   NA 141 11/15/2020   K 4.1 11/15/2020   CL 103 11/15/2020   CREATININE 0.77 11/15/2020   BUN 20 11/15/2020   CO2 22 11/15/2020   TSH 2.150 11/15/2020      Assessment & Plan:   Problem List Items Addressed This Visit       Other   Chronic fatigue - Primary   Relevant Medications   DULoxetine (CYMBALTA) 60 MG capsule  .  Meds ordered this encounter  Medications   DULoxetine (CYMBALTA) 60 MG capsule    Sig: Take 1 capsule (60 mg total) by mouth daily.    Dispense:  30 capsule    Refill:  3    Order Specific Question:   Supervising Provider    Answer:   Shelton Silvas    No orders of the defined types were placed in this encounter.    Follow-up: Return in about 3 months (around 03/15/2021) for chronic follow up.  An After Visit Summary was printed and given to the patient.  Yetta [facility] [phone]

## 2020-12-15 ENCOUNTER — Encounter: Payer: Self-pay | Admitting: Physician Assistant

## 2020-12-15 ENCOUNTER — Other Ambulatory Visit: Payer: Self-pay | Admitting: Physician Assistant

## 2020-12-15 DIAGNOSIS — R5382 Chronic fatigue, unspecified: Secondary | ICD-10-CM

## 2020-12-15 MED ORDER — DULOXETINE HCL 60 MG PO CPEP: 60.0000 mg | ORAL_CAPSULE | Freq: Every day | ORAL | 3 refills | Status: DC

## 2020-12-20 ENCOUNTER — Other Ambulatory Visit: Payer: Self-pay | Admitting: Allergy and Immunology

## 2020-12-21 ENCOUNTER — Other Ambulatory Visit: Payer: Self-pay | Admitting: Allergy and Immunology

## 2020-12-22 ENCOUNTER — Other Ambulatory Visit: Payer: Self-pay | Admitting: Allergy and Immunology

## 2021-01-10 ENCOUNTER — Other Ambulatory Visit: Payer: Self-pay | Admitting: Physician Assistant

## 2021-01-10 DIAGNOSIS — R5382 Chronic fatigue, unspecified: Secondary | ICD-10-CM

## 2021-01-10 DIAGNOSIS — F3289 Other specified depressive episodes: Secondary | ICD-10-CM

## 2021-01-10 MED ORDER — DULOXETINE HCL 60 MG PO CPEP: 60.0000 mg | ORAL_CAPSULE | Freq: Every day | ORAL | 3 refills | Status: DC

## 2021-01-17 ENCOUNTER — Other Ambulatory Visit: Payer: Self-pay | Admitting: Physician Assistant

## 2021-01-17 DIAGNOSIS — F3289 Other specified depressive episodes: Secondary | ICD-10-CM

## 2021-01-17 DIAGNOSIS — R5382 Chronic fatigue, unspecified: Secondary | ICD-10-CM

## 2021-02-11 ENCOUNTER — Encounter: Payer: Self-pay | Admitting: Physician Assistant

## 2021-02-13 ENCOUNTER — Encounter: Payer: Self-pay | Admitting: Neurology

## 2021-02-14 ENCOUNTER — Other Ambulatory Visit: Payer: Self-pay

## 2021-02-14 ENCOUNTER — Ambulatory Visit: Payer: 59 | Admitting: Physician Assistant

## 2021-02-14 ENCOUNTER — Encounter: Payer: Self-pay | Admitting: Physician Assistant

## 2021-02-14 VITALS — BP 112/68 | HR 67 | Temp 97.5°F | Ht 65.0 in | Wt 169.0 lb

## 2021-02-14 DIAGNOSIS — R202 Paresthesia of skin: Secondary | ICD-10-CM

## 2021-02-14 DIAGNOSIS — F809 Developmental disorder of speech and language, unspecified: Secondary | ICD-10-CM | POA: Diagnosis not present

## 2021-02-14 DIAGNOSIS — R5383 Other fatigue: Secondary | ICD-10-CM

## 2021-02-14 NOTE — Progress Notes (Signed)
Subjective:  Patient ID: Yolanda Sanchez, female    DOB: 1960-05-07  Age: 60 y.o. MRN: [id number]  Chief Complaint  Patient presents with   Discuss visit from Neurology    HPI  Pt in today states that for the past month she has began to have 'muscle spasms all over' and soreness all over.  She has a chronic history of fibromyalgia and currently on treatment for those symptoms but states this is new - she feels as though her muscles tighten up and remain painful -- legs,arms, neck, etc Pt also states that in the past month she has had trouble thinking, concentrating and 'trouble finding her words' - she says this has been going on for a few weeks Pt has had  thorough neurology workups in the past by 2 different neurologists for similar symptoms and multitude of complaints which have both been negative - however pt states she is concerned since these particular symptoms are new Current Outpatient Medications on File Prior to Visit  Medication Sig Dispense Refill   albuterol (VENTOLIN HFA) 108 (90 Base) MCG/ACT inhaler INHALE 2 PUFF EVERY 4 TO 6 HOURS AS NEEDED FOR COUGH OR WHEEZE 8.5 g 0   aspirin EC 81 MG tablet Take 1 tablet (81 mg total) by mouth daily. Swallow whole. 90 tablet 3   buPROPion (WELLBUTRIN XL) 300 MG 24 hr tablet TAKE 1 TABLET BY MOUTH DAILY 90 tablet 0   doxycycline (VIBRAMYCIN) 50 MG capsule TAKE 1 CAPSULE BY MOUTH DAILY 30 capsule 4   DULoxetine (CYMBALTA) 30 MG capsule Take 1 capsule (30 mg total) by mouth 2 (two) times daily. 60 capsule 2   DULoxetine (CYMBALTA) 60 MG capsule Take 60 mg by mouth daily.     famotidine (PEPCID) 40 MG tablet TAKE 1 TABLET BY MOUTH EVERY EVENING 30 tablet 4   fluticasone (FLONASE) 50 MCG/ACT nasal spray USE 2 SPRAYS INTO BOTH NOSTRILS DAILY ASNEEDED FOR ALLERGIES OR RHINITIS 16 g 2   lisinopril-hydrochlorothiazide (ZESTORETIC) 10-12.5 MG tablet Take 0.5 tablets by mouth daily. 45 tablet 3   meloxicam (MOBIC) 7.5 MG tablet Take 7.5 mg by  mouth daily. As needed.     metroNIDAZOLE (METROCREAM) 0.75 % cream Apply topically 2 (two) times daily. 45 g 5   omeprazole (PRILOSEC) 40 MG capsule TAKE 1 CAPSULE BY MOUTH IN THE MORNING 30 capsule 4   atorvastatin (LIPITOR) 10 MG tablet Take 1 tablet (10 mg total) by mouth daily. 90 tablet 3   No current facility-administered medications on file prior to visit.   Past Medical History:  Diagnosis Date   Abnormal stress test 11/10/2019   Anxiety    Asthma    Atypical chest pain 09/15/2019   Chest tightness 11/26/2019   Depression    Gait abnormality 11/03/2019   HSV infection    1 & 2    Hypercholesterolemia 09/11/2018   Hypertension    Left facial numbness 10/25/2019   Mixed hyperlipidemia 11/03/2019   Need for prophylactic vaccination and inoculation against influenza 03/01/2020   Need for tetanus, diphtheria, and acellular pertussis (Tdap) vaccine 03/01/2020   Obstructive sleep apnea 09/11/2018   Palpitations 09/15/2019   Paresthesia 11/03/2019   Swelling of both lower extremities 09/11/2018   Past Surgical History:  Procedure Laterality Date   BREAST BIOPSY Left    CHOLECYSTECTOMY     COLONOSCOPY     TUBAL LIGATION      Family History  Problem Relation Age of Onset   Hyperlipidemia Mother  Hypertension Mother    Depression Mother    Hyperlipidemia Father    Skin cancer Father    Hypertension Brother    Diabetes Brother    Myelodysplastic syndrome Brother    Heart attack Maternal Grandmother    Heart disease Maternal Grandmother    Heart attack Maternal Grandfather    Dementia Paternal Grandmother    Colon cancer Paternal Grandfather    Hypertension Brother    Hyperlipidemia Brother    Sleep apnea Brother    Diabetes Brother    Diabetes Son    Heart failure Sister    Atrial fibrillation Sister    Allergic rhinitis Neg Hx    Angioedema Neg Hx    Asthma Neg Hx    Atopy Neg Hx    Eczema Neg Hx    Immunodeficiency Neg Hx    Urticaria Neg Hx    Social History    Socioeconomic History   Marital status: Divorced    Spouse name: Not on file   Number of children: 2   Years of education: Not on file   Highest education level: Not on file  Occupational History   Not on file  Tobacco Use   Smoking status: Former    Types: Cigarettes    Quit date: 2019    Years since quitting: 3.9   Smokeless tobacco: Never   Tobacco comments:    on and off for 20 years   Vaping Use   Vaping Use: Former  Substance and Sexual Activity   Alcohol use: Yes    Alcohol/week: 1.0 - 2.0 standard drink    Types: 1 - 2 Standard drinks or equivalent per week   Drug use: Yes    Types: Marijuana    Comment: at night for insomnia sometimes    Sexual activity: Not on file  Other Topics Concern   Not on file  Social History Narrative   Lives alone    Right handed   Caffeine: 1 cup/day   Social Determinants of Health   Financial Resource Strain: Not on file  Food Insecurity: Not on file  Transportation Needs: Not on file  Physical Activity: Not on file  Stress: Not on file  Social Connections: Not on file    Review of Systems CONSTITUTIONAL: Negative for chills, fatigue, fever, unintentional weight gain and unintentional weight loss.  E/N/T: Negative for ear pain, nasal congestion and sore throat.  CARDIOVASCULAR: Negative for chest pain, dizziness, palpitations and pedal edema.  RESPIRATORY: Negative for recent cough and dyspnea.  GASTROINTESTINAL: Negative for abdominal pain, acid reflux symptoms, constipation, diarrhea, nausea and vomiting.  MSK: see HPI INTEGUMENTARY: Negative for rash.  NEUROLOGICAL: pt has chronic headaches - see HPI PSYCHIATRIC: see HPI      Objective:  BP 112/68 (BP Location: Right Arm, Patient Position: Sitting)   Pulse 67   Temp (!) 97.5 F (36.4 C) (Temporal)   Ht 5\' 5"  (1.651 m)   Wt 169 lb (76.7 kg)   SpO2 99%   BMI 28.12 kg/m   BP/Weight 02/14/2021 12/13/2020 3/54/6568  Systolic BP 127 517 001  Diastolic BP 68 76  80  Wt. (Lbs) 169 170.4 -  BMI 28.12 28.36 -    Physical Exam PHYSICAL EXAM:   VS: BP 112/68 (BP Location: Right Arm, Patient Position: Sitting)   Pulse 67   Temp (!) 97.5 F (36.4 C) (Temporal)   Ht 5\' 5"  (1.651 m)   Wt 169 lb (76.7 kg)   SpO2 99%  BMI 28.12 kg/m   GEN: Well nourished, well developed, in no acute distress - tearful, anxious Cardiac: RRR; no murmurs,  Respiratory:  normal respiratory rate and pattern with no distress - normal breath sounds with no rales, rhonchi, wheezes or rubs Skin: warm and dry, no rash  Neuro:  Alert and Oriented x 3, - CN II-Xii grossly intact Psych: pt tearful -   Diabetic Foot Exam - Simple   No data filed      Lab Results  Component Value Date   WBC 6.8 11/15/2020   HGB 13.7 11/15/2020   HCT 40.8 11/15/2020   PLT 188 11/15/2020   GLUCOSE 79 11/15/2020   CHOL 134 08/30/2020   TRIG 56 08/30/2020   HDL 44 08/30/2020   LDLCALC 78 08/30/2020   ALT 28 11/15/2020   AST 26 11/15/2020   NA 141 11/15/2020   K 4.1 11/15/2020   CL 103 11/15/2020   CREATININE 0.77 11/15/2020   BUN 20 11/15/2020   CO2 22 11/15/2020   TSH 2.150 11/15/2020      Assessment & Plan:   Problem List Items Addressed This Visit       Other   Paresthesia - Primary   Relevant Orders   CBC with Differential/Platelet   Comprehensive metabolic panel   TSH   Magnesium   Other Visit Diagnoses     Other fatigue       Relevant Orders   CBC with Differential/Platelet   Comprehensive metabolic panel   TSH   Magnesium   Speech and language deficits       Relevant Orders   MR Brain W Wo Contrast     .  No orders of the defined types were placed in this encounter.   Orders Placed This Encounter  Procedures   MR Brain W Wo Contrast   CBC with Differential/Platelet   Comprehensive metabolic panel   TSH   Magnesium     Follow-up: Return for pt has chronic follow up in January. As stated patient has had thorough neurology work up in past  --- since having new symptoms since last MRI over a year ago will repeat that and if any abnormalities will refer back to specialist Also obtain labwork If all normal will consider changing treatment for fibromyalgia / anxiety  An After Visit Summary was printed and given to the patient.  Yetta Flock [facility] [phone]

## 2021-02-15 LAB — CBC WITH DIFFERENTIAL/PLATELET
Basophils Absolute: 0.1 10*3/uL (ref 0.0–0.2)
Basos: 1 %
EOS (ABSOLUTE): 0.1 10*3/uL (ref 0.0–0.4)
Eos: 1 %
Hematocrit: 42.2 % (ref 34.0–46.6)
Hemoglobin: 14.6 g/dL (ref 11.1–15.9)
Immature Grans (Abs): 0 10*3/uL (ref 0.0–0.1)
Immature Granulocytes: 0 %
Lymphocytes Absolute: 2.4 10*3/uL (ref 0.7–3.1)
Lymphs: 37 %
MCH: 31.3 pg (ref 26.6–33.0)
MCHC: 34.6 g/dL (ref 31.5–35.7)
MCV: 91 fL (ref 79–97)
Monocytes Absolute: 0.4 10*3/uL (ref 0.1–0.9)
Monocytes: 7 %
Neutrophils Absolute: 3.4 10*3/uL (ref 1.4–7.0)
Neutrophils: 54 %
Platelets: 194 10*3/uL (ref 150–450)
RBC: 4.66 x10E6/uL (ref 3.77–5.28)
RDW: 12.6 % (ref 11.7–15.4)
WBC: 6.3 10*3/uL (ref 3.4–10.8)

## 2021-02-15 LAB — COMPREHENSIVE METABOLIC PANEL
ALT: 19 IU/L (ref 0–32)
AST: 21 IU/L (ref 0–40)
Albumin/Globulin Ratio: 2 (ref 1.2–2.2)
Albumin: 4.9 g/dL (ref 3.8–4.9)
Alkaline Phosphatase: 67 IU/L (ref 44–121)
BUN/Creatinine Ratio: 20 (ref 9–23)
BUN: 19 mg/dL (ref 6–24)
Bilirubin Total: 0.5 mg/dL (ref 0.0–1.2)
CO2: 27 mmol/L (ref 20–29)
Calcium: 10 mg/dL (ref 8.7–10.2)
Chloride: 104 mmol/L (ref 96–106)
Creatinine, Ser: 0.93 mg/dL (ref 0.57–1.00)
Globulin, Total: 2.4 g/dL (ref 1.5–4.5)
Glucose: 86 mg/dL (ref 70–99)
Potassium: 4.3 mmol/L (ref 3.5–5.2)
Sodium: 142 mmol/L (ref 134–144)
Total Protein: 7.3 g/dL (ref 6.0–8.5)
eGFR: 71 mL/min/{1.73_m2} (ref >59)

## 2021-02-15 LAB — MAGNESIUM: Magnesium: 2.3 mg/dL (ref 1.6–2.3)

## 2021-02-15 LAB — TSH: TSH: 1.25 u[IU]/mL (ref 0.450–4.500)

## 2021-02-27 ENCOUNTER — Ambulatory Visit: Payer: 59 | Admitting: Cardiology

## 2021-03-02 ENCOUNTER — Ambulatory Visit
Admission: RE | Admit: 2021-03-02 | Discharge: 2021-03-02 | Disposition: A | Discharge status: Home / Self Care | Payer: 59 | Source: Ambulatory Visit | Attending: Physician Assistant | Admitting: Physician Assistant

## 2021-03-02 ENCOUNTER — Ambulatory Visit: Payer: 59 | Admitting: Physician Assistant

## 2021-03-02 ENCOUNTER — Other Ambulatory Visit: Payer: Self-pay

## 2021-03-02 DIAGNOSIS — F809 Developmental disorder of speech and language, unspecified: Secondary | ICD-10-CM

## 2021-03-02 MED ORDER — GADOBENATE DIMEGLUMINE 529 MG/ML IV SOLN
15.0000 mL | Freq: Once | INTRAVENOUS | Status: AC | PRN
  Administered 2021-03-02: 09:00:00 15 mL via INTRAVENOUS

## 2021-03-06 ENCOUNTER — Encounter: Payer: Self-pay | Admitting: Physician Assistant

## 2021-03-09 ENCOUNTER — Telehealth: Payer: Self-pay | Admitting: Physician Assistant

## 2021-03-09 NOTE — Telephone Encounter (Signed)
Please call patient --- after reviewing chart I do see that she has not had recent B12/MMA/folate studies and with her various symptoms recommend to have done --- also would discuss repeating a MRI of Cspine - last one done in 8/21 but did show some abnormalities  Please have pt schedule appt in the next week or two to discuss (please block 40 min)

## 2021-03-09 NOTE — Telephone Encounter (Signed)
Made pt aware. She will be in 03/23/21.   Harrell Lark 03/09/21 4:56 PM

## 2021-03-22 ENCOUNTER — Encounter: Payer: Self-pay | Admitting: Cardiology

## 2021-03-23 ENCOUNTER — Other Ambulatory Visit: Payer: Self-pay

## 2021-03-23 ENCOUNTER — Ambulatory Visit: Payer: 59 | Admitting: Physician Assistant

## 2021-03-23 ENCOUNTER — Ambulatory Visit (INDEPENDENT_AMBULATORY_CARE_PROVIDER_SITE_OTHER): Payer: 59

## 2021-03-23 ENCOUNTER — Ambulatory Visit: Payer: 59 | Admitting: Cardiology

## 2021-03-23 ENCOUNTER — Encounter: Payer: Self-pay | Admitting: Physician Assistant

## 2021-03-23 ENCOUNTER — Encounter: Payer: Self-pay | Admitting: Cardiology

## 2021-03-23 VITALS — BP 114/70 | HR 79 | Temp 97.3°F | Ht 65.0 in | Wt 168.0 lb

## 2021-03-23 VITALS — BP 128/72 | HR 75 | Ht 65.0 in | Wt 169.0 lb

## 2021-03-23 DIAGNOSIS — R0789 Other chest pain: Secondary | ICD-10-CM | POA: Diagnosis not present

## 2021-03-23 DIAGNOSIS — R002 Palpitations: Secondary | ICD-10-CM

## 2021-03-23 DIAGNOSIS — R55 Syncope and collapse: Secondary | ICD-10-CM | POA: Diagnosis not present

## 2021-03-23 DIAGNOSIS — M5412 Radiculopathy, cervical region: Secondary | ICD-10-CM

## 2021-03-23 DIAGNOSIS — R202 Paresthesia of skin: Secondary | ICD-10-CM | POA: Diagnosis not present

## 2021-03-23 DIAGNOSIS — E7800 Pure hypercholesterolemia, unspecified: Secondary | ICD-10-CM

## 2021-03-23 DIAGNOSIS — E782 Mixed hyperlipidemia: Secondary | ICD-10-CM

## 2021-03-23 DIAGNOSIS — F3289 Other specified depressive episodes: Secondary | ICD-10-CM

## 2021-03-23 DIAGNOSIS — I10 Essential (primary) hypertension: Secondary | ICD-10-CM | POA: Diagnosis not present

## 2021-03-23 NOTE — Progress Notes (Signed)
Cardiology Office Note:    Date:  03/23/2021   ID:  [name], DOB 1961/02/23, MRN [id number]  PCP:  Marge Duncans, PA-C  Cardiologist:  Jenne Campus, MD    Referring MD: Marge Duncans, PA-C   Chief Complaint  Patient presents with   questions about a heart procedure     History of Present Illness:    Yolanda Sanchez is a 61 y.o. female with past medical history significant for atypical chest pain, initially stress test was done which was abnormal however after that coronary CT angio showed perfectly normal coronary arteries.  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 She comes today to my office for follow-up.  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  She is scheduled to see some specialist in [place] for spine issues.  She wanted me to do heart rate monitor to see heart rate variability which I will do.  Also asked them to repeat carotic ultrasounds which we will do.  Past Medical History:  Diagnosis Date   Abnormal stress test 11/10/2019   Anxiety    Asthma    Atypical chest pain 09/15/2019   Chest tightness 11/26/2019   Depression    Gait abnormality 11/03/2019   HSV infection    1 & 2    Hypercholesterolemia 09/11/2018   Hypertension    Left facial numbness 10/25/2019   Mixed hyperlipidemia 11/03/2019   Need for prophylactic vaccination and inoculation against influenza 03/01/2020   Need for tetanus, diphtheria, and acellular pertussis (Tdap) vaccine 03/01/2020   Obstructive  sleep apnea 09/11/2018   Palpitations 09/15/2019   Paresthesia 11/03/2019   Swelling of both lower extremities 09/11/2018    Past Surgical History:  Procedure Laterality Date   BREAST BIOPSY Left    CHOLECYSTECTOMY     COLONOSCOPY     TUBAL LIGATION      Current Medications: Current Meds  Medication Sig   albuterol (VENTOLIN HFA) 108 (90 Base) MCG/ACT inhaler Inhale 2 puffs into the lungs every 6 (six) hours as needed for wheezing or shortness of breath.   aspirin EC 81 MG tablet Take 1 tablet (81 mg total) by mouth daily. Swallow whole.   atorvastatin (LIPITOR) 10 MG tablet Take 1 tablet (10 mg total) by mouth daily.   buPROPion (WELLBUTRIN XL) 300 MG 24 hr tablet Take 300 mg by mouth daily.   doxycycline (VIBRAMYCIN) 50 MG capsule Take 50 mg by mouth 2 (two) times daily.   DULoxetine (CYMBALTA) 30 MG capsule Take 1 capsule (30 mg total) by mouth 2 (two) times daily.   famotidine (PEPCID) 40 MG tablet Take 40 mg by mouth daily.   fluticasone (FLONASE) 50 MCG/ACT nasal spray Place 2 sprays into both nostrils as needed for allergies or rhinitis.   lisinopril-hydrochlorothiazide (ZESTORETIC) 10-12.5 MG tablet Take 0.5 tablets by mouth daily.   meloxicam (MOBIC) 7.5 MG tablet Take 7.5 mg by mouth daily. As needed.   metroNIDAZOLE (METROCREAM) 0.75 % cream Apply topically 2 (two) times daily. (Patient  taking differently: Apply 1 application topically 2 (two) times daily.)   omeprazole (PRILOSEC) 40 MG capsule Take 40 mg by mouth daily.     Allergies:   Ranexa [ranolazine]   Social History   Socioeconomic History   Marital status: Divorced    Spouse name: Not on file   Number of children: 2   Years of education: Not on file   Highest education level: Not on file  Occupational History   Not on file  Tobacco Use   Smoking status: Former    Types: Cigarettes    Quit date: 2019    Years since quitting: 4.0   Smokeless tobacco: Never   Tobacco comments:    on and off for 20 years    Vaping Use   Vaping Use: Former  Substance and Sexual Activity   Alcohol use: Yes    Alcohol/week: 1.0 - 2.0 standard drink    Types: 1 - 2 Standard drinks or equivalent per week   Drug use: Yes    Types: Marijuana    Comment: at night for insomnia sometimes    Sexual activity: Not on file  Other Topics Concern   Not on file  Social History Narrative   Lives alone    Right handed   Caffeine: 1 cup/day   Social Determinants of Health   Financial Resource Strain: Not on file  Food Insecurity: Not on file  Transportation Needs: Not on file  Physical Activity: Not on file  Stress: Not on file  Social Connections: Not on file     Family History: The patient's family history includes Atrial fibrillation in her sister; Colon cancer in her paternal grandfather; Dementia in her paternal grandmother; Depression in her mother; Diabetes in her brother, brother, and son; Heart attack in her maternal grandfather and maternal grandmother; Heart disease in her maternal grandmother; Heart failure in her sister; Hyperlipidemia in her brother, father, and mother; Hypertension in her brother, brother, and mother; Myelodysplastic syndrome in her brother; Skin cancer in her father; Sleep apnea in her brother. There is no history of Allergic rhinitis, Angioedema, Asthma, Atopy, Eczema, Immunodeficiency, or Urticaria. ROS:   Please see the history of present illness.    All 14 point review of systems negative except as described per history of present illness  EKGs/Labs/Other Studies Reviewed:      Recent Labs: 02/14/2021: ALT 19; BUN 19; Creatinine, Ser 0.93; Hemoglobin 14.6; Magnesium 2.3; Platelets 194; Potassium 4.3; Sodium 142; TSH 1.250  Recent Lipid Panel    Component Value Date/Time   CHOL 134 08/30/2020 0851   TRIG 56 08/30/2020 0851   HDL 44 08/30/2020 0851   CHOLHDL 3.0 08/30/2020 0851   LDLCALC 78 08/30/2020 0851    Physical Exam:    VS:  BP 128/72 (BP Location: Left Arm,  Patient Position: Sitting)    Pulse 75    Ht 5\' 5"  (1.651 m)    Wt 169 lb (76.7 kg)    SpO2 92%    BMI 28.12 kg/m     Wt Readings from Last 3 Encounters:  03/23/21 169 lb (76.7 kg)  02/14/21 169 lb (76.7 kg)  12/13/20 170 lb 6.4 oz (77.3 kg)     GEN:  Well nourished, well developed in no acute distress HEENT: Normal NECK: No JVD; No carotid bruits LYMPHATICS: No lymphadenopathy CARDIAC: RRR, no murmurs, no rubs, no gallops RESPIRATORY:  Clear to auscultation without rales, wheezing or rhonchi  ABDOMEN: Soft, non-tender, non-distended MUSCULOSKELETAL:  No edema;  No deformity  SKIN: Warm and dry LOWER EXTREMITIES: no swelling NEUROLOGIC:  Alert and oriented x 3 PSYCHIATRIC:  Normal affect   ASSESSMENT:    1. Atypical chest pain   2. Hypercholesterolemia   3. Syncope and collapse   4. Palpitations    PLAN:    In order of problems listed above:  Atypical chest pain denies having any doing well from that point we will continue present management. Dyslipidemia she is taking Lipitor 10 which I will continue her laboratory test show LDL of 78 HDL 44 this is from [facility] that I reviewed for this visit. Syncope and collapse denies having any however does have some palpitations therefore we will put monitor on her for 7 days. She does have a constellation very atypical symptoms.  I still strongly suspect that this is probably psychological.  However she is focused on the fact that she have some carotic artery stenosis I told her that up to 59% stenosis should not give antibiotic no symptoms.  She want me to recheck it which I will do.  She is also getting appointment with some specialist for spine in [place] she is convinced that she may have a vagal nerve problem.   Medication Adjustments/Labs and Tests Ordered: Current medicines are reviewed at length with the patient today.  Concerns regarding medicines are outlined above.  No orders of the defined types were placed in this  encounter.  Medication changes: No orders of the defined types were placed in this encounter.   Signed, Park Liter, MD, [facility] 03/23/2021 8:30 AM    [place]

## 2021-03-23 NOTE — Addendum Note (Signed)
Addended by: Carylon Perches on: 03/23/2021 08:37 AM   Modules accepted: Orders

## 2021-03-23 NOTE — Progress Notes (Addendum)
Subjective:  Patient ID: Yolanda Sanchez, female    DOB: May 23, 1960  Age: 61 y.o. MRN: [id number]  Chief Complaint  Patient presents with   Hypertension    HPI  Pt presents for follow up of hypertension. The patient is tolerating the medication well without side effects. Compliance with treatment has been good; including taking medication as directed , maintains a healthy diet and regular exercise regimen , and following up as directed. She is currently on zestoretic 10/12.38m qd - Of note pt currently following with cardiology Dr KAgustin Cree- states he is planning to do a carotid ultrasound and she is wearing a ZIO patch today - says she has had intermittent chest pains and that she he is checking her "heart rate variability' to assess possible vasovagal nerve impingement -- pt has researched her symptoms and thinks a lot of her issues could be due to that --- she brings several papers she has printed with her current symptoms and spreadsheets of algorithms of possible diagnoses.  Mixed hyperlipidemia  Pt presents with hyperlipidemia.  The patient is compliant with medications, maintains a low cholesterol diet , follows up as directed , and maintains an exercise regimen . The patient denies experiencing any hypercholesterolemia related symptoms. 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 which needs to be re-evaluated again -- pt states most of her  symptoms of pain, headaches etc are on the left side and she has had some tingling in fingers bilaterally as well  Pt follows with GI - Dr MeEarlean Shawl has upcoming appt to address hemorrhoids and mentions today having issues with constipation  Pt has seen her ophthalmologist Dr HeLoel Roor blurry vision and other issues  Pt following with her GYN Dr HoPhilis Piqueor dyspareunia and bleeding after intercourse   Current Outpatient Medications on File Prior to Visit  Medication Sig Dispense Refill   albuterol (VENTOLIN HFA) 108 (90 Base) MCG/ACT inhaler Inhale 2 puffs into the lungs every 6 (six) hours as needed for wheezing or shortness of breath.     aspirin EC 81 MG tablet Take 1 tablet (81 mg total) by mouth daily. Swallow whole. 90 tablet 3   atorvastatin (LIPITOR) 10 MG tablet Take 1 tablet (10 mg total) by mouth daily. 90 tablet 3   buPROPion (WELLBUTRIN XL) 300 MG 24 hr tablet Take 300 mg by mouth daily.     doxycycline (VIBRAMYCIN) 50 MG capsule Take 50 mg by mouth 2 (two) times daily.     DULoxetine (CYMBALTA) 30 MG capsule Take 1 capsule (30 mg total) by mouth 2 (two) times daily. 60 capsule 2   famotidine (PEPCID) 40 MG tablet Take 40 mg by mouth daily.     fluticasone (FLONASE) 50 MCG/ACT nasal spray Place 2 sprays into both nostrils as needed for allergies or rhinitis.     lisinopril-hydrochlorothiazide (ZESTORETIC) 10-12.5 MG tablet Take 0.5 tablets by mouth daily. 45 tablet 3   meloxicam (MOBIC)  7.5 MG tablet Take 7.5 mg by mouth daily. As needed.     metroNIDAZOLE (METROCREAM) 0.75 % cream Apply topically 2 (two) times daily. (Patient taking differently: Apply 1 application topically 2 (two) times daily.) 45 g 5   omeprazole (PRILOSEC) 40 MG capsule Take 40 mg by mouth daily.     No current facility-administered medications on file prior to visit.   Past Medical History:  Diagnosis Date   Abnormal stress test 11/10/2019   Anxiety    Asthma    Atypical chest pain 09/15/2019   Chest  tightness 11/26/2019   Depression    Gait abnormality 11/03/2019   HSV infection    1 & 2    Hypercholesterolemia 09/11/2018   Hypertension    Left facial numbness 10/25/2019   Mixed hyperlipidemia 11/03/2019   Need for prophylactic vaccination and inoculation against influenza 03/01/2020   Need for tetanus, diphtheria, and acellular pertussis (Tdap) vaccine 03/01/2020   Obstructive sleep apnea 09/11/2018   Palpitations 09/15/2019   Paresthesia 11/03/2019   Swelling of both lower extremities 09/11/2018   Past Surgical History:  Procedure Laterality Date   BREAST BIOPSY Left    CHOLECYSTECTOMY     COLONOSCOPY     TUBAL LIGATION      Family History  Problem Relation Age of Onset   Hyperlipidemia Mother    Hypertension Mother    Depression Mother    Hyperlipidemia Father    Skin cancer Father    Hypertension Brother    Diabetes Brother    Myelodysplastic syndrome Brother    Heart attack Maternal Grandmother    Heart disease Maternal Grandmother    Heart attack Maternal Grandfather    Dementia Paternal Grandmother    Colon cancer Paternal Grandfather    Hypertension Brother    Hyperlipidemia Brother    Sleep apnea Brother    Diabetes Brother    Diabetes Son    Heart failure Sister    Atrial fibrillation Sister    Allergic rhinitis Neg Hx    Angioedema Neg Hx    Asthma Neg Hx    Atopy Neg Hx    Eczema Neg Hx    Immunodeficiency Neg Hx    Urticaria Neg Hx    Social History   Socioeconomic History   Marital status: Divorced    Spouse name: Not on file   Number of children: 2   Years of education: Not on file   Highest education level: Not on file  Occupational History   Not on file  Tobacco Use   Smoking status: Former    Types: Cigarettes    Quit date: 2019    Years since quitting: 4.0   Smokeless tobacco: Never   Tobacco comments:    on and off for 20 years   Vaping Use   Vaping Use: Former  Substance and Sexual Activity   Alcohol use: Yes    Alcohol/week: 1.0  - 2.0 standard drink    Types: 1 - 2 Standard drinks or equivalent per week   Drug use: Yes    Types: Marijuana    Comment: at night for insomnia sometimes    Sexual activity: Not on file  Other Topics Concern   Not on file  Social History Narrative   Lives alone    Right handed   Caffeine: 1 cup/day   Social Determinants of Health   Financial Resource Strain: Not on file  Food Insecurity: Not on file  Transportation Needs: Not on file  Physical Activity: Not on file  Stress: Not on file  Social Connections: Not on file    Review of Systems CONSTITUTIONAL: see HPI E/N/T: Negative for ear pain, nasal congestion and sore throat.  CARDIOVASCULAR: see HPI RESPIRATORY: Negative for recent cough and dyspnea.  GASTROINTESTINAL: Negative for abdominal pain, acid reflux symptoms, constipation, diarrhea, nausea and vomiting.  MSK:see HPI INTEGUMENTARY: Negative for rash.  NEUROLOGICAL: see HPI PSYCHIATRIC: see HPI      Objective:  BP 114/70 (BP Location: Left Arm, Patient Position: Sitting, Cuff Size: Normal)    Pulse 79    Temp (!) 97.3 F (36.3 C) (Temporal)    Ht '5\' 5"'  (1.651 m)    Wt 168 lb (76.2 kg)    SpO2 95%    BMI 27.96 kg/m   BP/Weight 03/23/2021 03/23/2021 19/07/930  Systolic BP 671 245 809  Diastolic BP 70 72 68  Wt. (Lbs) 168 169 169  BMI 27.96 28.12 28.12    Physical Exam PHYSICAL EXAM:   VS: BP 114/70 (BP Location: Left Arm, Patient Position: Sitting, Cuff Size: Normal)    Pulse 79    Temp (!) 97.3 F (36.3 C) (Temporal)    Ht '5\' 5"'  (1.651 m)    Wt 168 lb (76.2 kg)    SpO2 95%    BMI 27.96 kg/m   GEN: Well nourished, well developed, in no acute distress  Cardiac: RRR; no murmurs, rubs, or gallops,no edema -  Respiratory:  normal respiratory rate and pattern with no distress - normal breath sounds with no rales, rhonchi, wheezes or rubs MS: no deformity or atrophy  Skin: warm and dry, no rash  Psych: euthymic mood, appropriate affect and  demeanor  Diabetic Foot Exam - Simple   No data filed      Lab Results  Component Value Date   WBC 6.3 02/14/2021   HGB 14.6 02/14/2021   HCT 42.2 02/14/2021   PLT 194 02/14/2021   GLUCOSE 86 02/14/2021   CHOL 134 08/30/2020   TRIG 56 08/30/2020   HDL 44 08/30/2020   LDLCALC 78 08/30/2020   ALT 19 02/14/2021   AST 21 02/14/2021   NA 142 02/14/2021   K 4.3 02/14/2021   CL 104 02/14/2021   CREATININE 0.93 02/14/2021   BUN 19 02/14/2021   CO2 27 02/14/2021   TSH 1.250 02/14/2021      Assessment & Plan:   Problem List Items Addressed This Visit       Cardiovascular and Mediastinum   Hypertension - Primary   Relevant Orders   CBC with Differential/Platelet   Comprehensive metabolic panel   TSH     Other   Hypercholesterolemia Continue current meds   Paresthesia   Relevant Orders   B12 and Folate Panel   Methylmalonic acid, serum   TSH   Mixed hyperlipidemia   Relevant Orders   Lipid panel   Depression Continue meds   Other Visit Diagnoses     Cervical radiculopathy       Relevant Orders   MR Cervical Spine Wo Contrast     .  No orders of the defined types were placed in this encounter.   Orders Placed This Encounter  Procedures   MR Cervical Spine Wo Contrast   CBC with Differential/Platelet   Comprehensive metabolic panel   X83 and Folate Panel   Methylmalonic acid, serum   Lipid panel   TSH     Follow-up: Return in about 4 months (around 07/21/2021) for chronic  fasting follow up.  An After Visit Summary was printed and given to the patient. AFTER PT APPT I RECEIVED CARDIOLOGY NOTE THAT STATES SHE HAS AN APPT SCHEDULED WITH A SPINE SPECIALIST IN [place] - WE CONTACTED PATIENT AND SHE SAYS THAT NO SHE DOES NOT HAVE AN APPT YET BUT WILL PURSUE THAT ROUTE IF SHE DOES NOT GET ANY ANSWERS OTHERWISE --- She had not mentioned any of that information at this visit and commented she brought 'all the papers with her' to discuss her myriad of  symptoms (as mentioned above multiple, numerous pages of symptoms, google printouts and algorithyms)  I spoke with patient and once again explained that I am trying to get answers for her despite the multiple in depth neurological evaluations that she has had already (which is why she got the labwork done today as well as a repeat Cspine MRI) ---  Yetta Flock [facility] [phone]

## 2021-03-23 NOTE — Patient Instructions (Signed)
Medication Instructions:  Your physician recommends that you continue on your current medications as directed. Please refer to the Current Medication list given to you today.  *If you need a refill on your cardiac medications before your next appointment, please call your pharmacy*   Lab Work: None If you have labs (blood work) drawn today and your tests are completely normal, you will receive your results only by: [place] (if you have MyChart) OR A paper copy in the mail If you have any lab test that is abnormal or we need to change your treatment, we will call you to review the results.   Testing/Procedures: Your physician has requested that you have a carotid duplex. This test is an ultrasound of the carotid arteries in your neck. It looks at blood flow through these arteries that supply the brain with blood. Allow one hour for this exam. There are no restrictions or special instructions.   Follow-Up: At [facility], you and your health needs are our priority.  As part of our continuing mission to provide you with exceptional heart care, we have created designated Provider Care Teams.  These Care Teams include your primary Cardiologist (physician) and Advanced Practice Providers (APPs -  Physician Assistants and Nurse Practitioners) who all work together to provide you with the care you need, when you need it.   Your next appointment:   6 month(s)  The format for your next appointment:   In Person  Provider:   Jenne Campus, MD

## 2021-03-24 ENCOUNTER — Ambulatory Visit (INDEPENDENT_AMBULATORY_CARE_PROVIDER_SITE_OTHER): Payer: 59

## 2021-03-24 DIAGNOSIS — R55 Syncope and collapse: Secondary | ICD-10-CM

## 2021-03-24 DIAGNOSIS — R002 Palpitations: Secondary | ICD-10-CM

## 2021-03-24 DIAGNOSIS — R0789 Other chest pain: Secondary | ICD-10-CM

## 2021-03-24 DIAGNOSIS — E7800 Pure hypercholesterolemia, unspecified: Secondary | ICD-10-CM

## 2021-03-26 IMAGING — DX DG NECK SOFT TISSUE
2 series · 2 of 2 positions shown · non-contrast
Comparison: None.

CLINICAL DATA: Tight feeling left throat.

EXAM:
NECK SOFT TISSUES - 1+ VIEW

[neck lat]
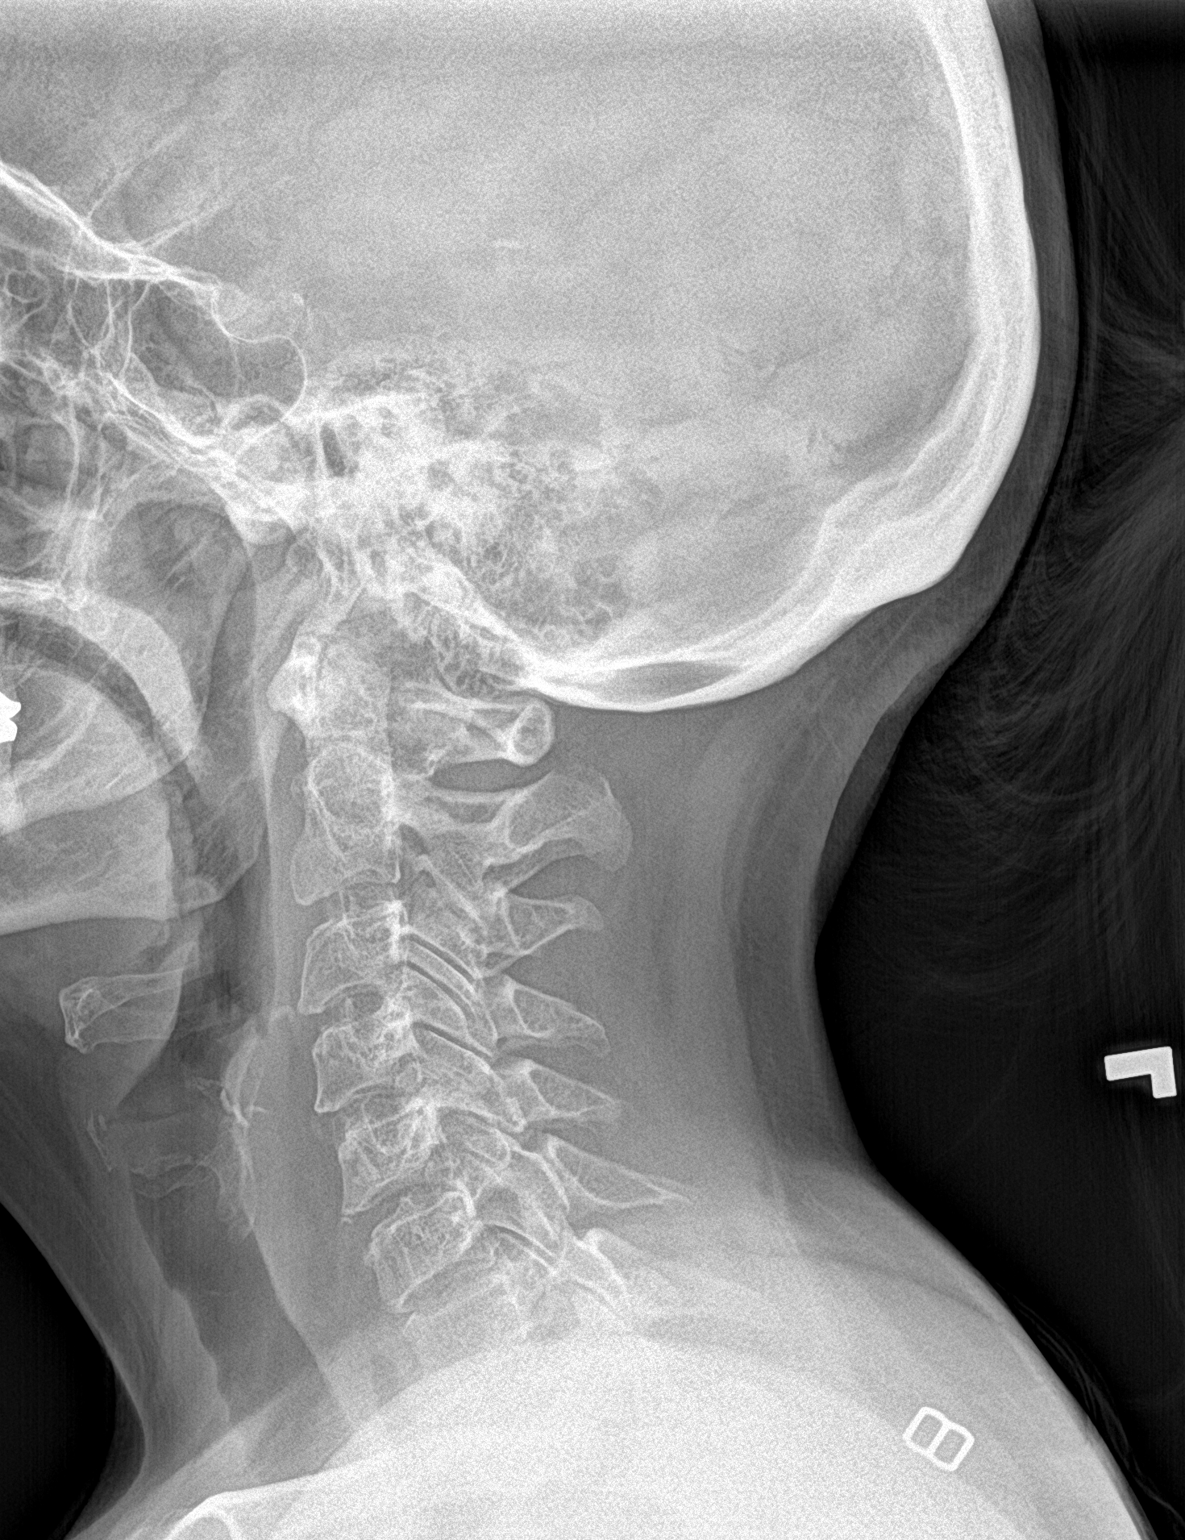

[neck ap]
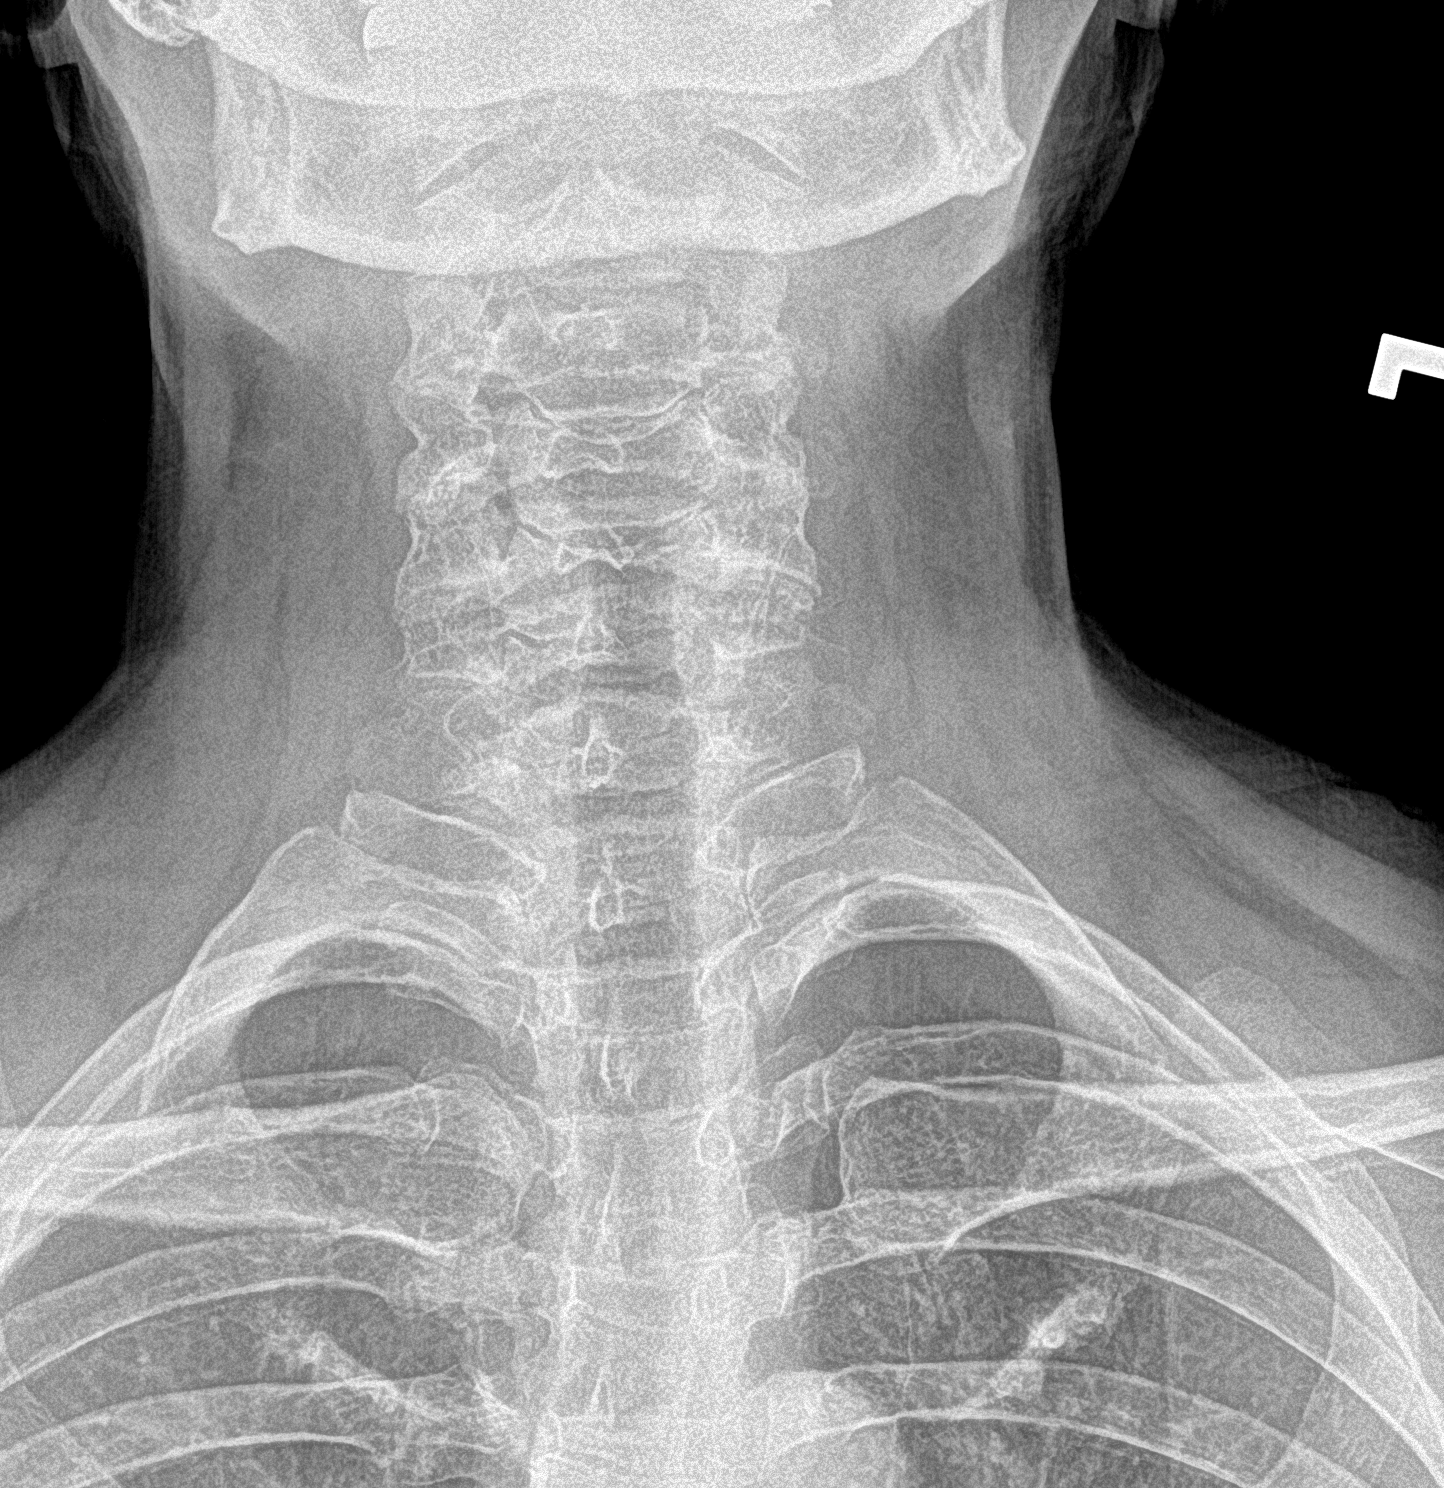

[2 of 2 positions shown; findings below may reference images not displayed]

FINDINGS: There is no evidence of retropharyngeal soft tissue swelling or
epiglottic enlargement. The cervical airway is unremarkable. No
radio-opaque foreign body identified. The soft tissue planes are non
suspicious.
IMPRESSION: Negative soft tissue neck radiographs.

## 2021-03-27 ENCOUNTER — Encounter: Payer: Self-pay | Admitting: Physician Assistant

## 2021-03-27 LAB — CBC WITH DIFFERENTIAL/PLATELET
Basophils Absolute: 0.1 10*3/uL (ref 0.0–0.2)
Basos: 1 %
EOS (ABSOLUTE): 0.1 10*3/uL (ref 0.0–0.4)
Eos: 1 %
Hematocrit: 41.9 % (ref 34.0–46.6)
Hemoglobin: 14.5 g/dL (ref 11.1–15.9)
Immature Grans (Abs): 0 10*3/uL (ref 0.0–0.1)
Immature Granulocytes: 0 %
Lymphocytes Absolute: 2.2 10*3/uL (ref 0.7–3.1)
Lymphs: 37 %
MCH: 31.3 pg (ref 26.6–33.0)
MCHC: 34.6 g/dL (ref 31.5–35.7)
MCV: 91 fL (ref 79–97)
Monocytes Absolute: 0.4 10*3/uL (ref 0.1–0.9)
Monocytes: 7 %
Neutrophils Absolute: 3.2 10*3/uL (ref 1.4–7.0)
Neutrophils: 54 %
Platelets: 189 10*3/uL (ref 150–450)
RBC: 4.63 x10E6/uL (ref 3.77–5.28)
RDW: 13 % (ref 11.7–15.4)
WBC: 5.9 10*3/uL (ref 3.4–10.8)

## 2021-03-27 LAB — COMPREHENSIVE METABOLIC PANEL
ALT: 25 IU/L (ref 0–32)
AST: 31 IU/L (ref 0–40)
Albumin/Globulin Ratio: 2.3 — ABNORMAL HIGH (ref 1.2–2.2)
Albumin: 5 g/dL — ABNORMAL HIGH (ref 3.8–4.9)
Alkaline Phosphatase: 64 IU/L (ref 44–121)
BUN/Creatinine Ratio: 18 (ref 9–23)
BUN: 16 mg/dL (ref 6–24)
Bilirubin Total: 0.6 mg/dL (ref 0.0–1.2)
CO2: 25 mmol/L (ref 20–29)
Calcium: 10.2 mg/dL (ref 8.7–10.2)
Chloride: 104 mmol/L (ref 96–106)
Creatinine, Ser: 0.88 mg/dL (ref 0.57–1.00)
Globulin, Total: 2.2 g/dL (ref 1.5–4.5)
Glucose: 86 mg/dL (ref 70–99)
Potassium: 4.3 mmol/L (ref 3.5–5.2)
Sodium: 144 mmol/L (ref 134–144)
Total Protein: 7.2 g/dL (ref 6.0–8.5)
eGFR: 76 mL/min/{1.73_m2} (ref >59)

## 2021-03-27 LAB — B12 AND FOLATE PANEL
Folate: >20 ng/mL (ref >3.0)
Vitamin B-12: 688 pg/mL (ref 232–1245)

## 2021-03-27 LAB — LIPID PANEL
Chol/HDL Ratio: 2.2 ratio (ref 0.0–4.4)
Cholesterol, Total: 147 mg/dL (ref 100–199)
HDL: 67 mg/dL (ref >39)
LDL Chol Calc (NIH): 68 mg/dL (ref 0–99)
Triglycerides: 57 mg/dL (ref 0–149)
VLDL Cholesterol Cal: 12 mg/dL (ref 5–40)

## 2021-03-27 LAB — CARDIOVASCULAR RISK ASSESSMENT

## 2021-03-27 LAB — METHYLMALONIC ACID, SERUM: Methylmalonic Acid: 155 nmol/L (ref 0–378)

## 2021-03-27 LAB — TSH: TSH: 0.877 u[IU]/mL (ref 0.450–4.500)

## 2021-03-27 NOTE — Telephone Encounter (Signed)
In normal range --- numbers can fluctuate

## 2021-03-30 ENCOUNTER — Encounter: Payer: Self-pay | Admitting: Neurology

## 2021-03-30 NOTE — Telephone Encounter (Signed)
I am ordering a Cspine MRI without contrast to reassess the spinal stenosis seen in the one of 10/2019 as discussed already with patient prior to her recent appt with me and discussed at appt It all depends on results of the MRI where and if she would be referred to another specialist for that problem As far as any other testing or further workup for her on the vasovagal nerve (which is what she has googled and looking up testing for and researching ) that is not in my scope of practice and recommend she follow up with cardiology as she has already discussed with them -

## 2021-03-31 NOTE — Telephone Encounter (Signed)
I will not do further neurological testing as she has had two full neurological evaluations-- Dr Jaynee Eagles is not accepting patient back at this time Pt had mentioned seeing provider in [place] for her symptoms or she can see if her insurance would cover another provider at perhaps [facility] or [place] for a third opinion from neurology if they are willing to see her

## 2021-03-31 NOTE — Telephone Encounter (Signed)
Pt notified.   Yolanda Sanchez, [place] 03/31/21 10:28 AM

## 2021-04-01 ENCOUNTER — Encounter: Payer: Self-pay | Admitting: Neurology

## 2021-04-03 ENCOUNTER — Encounter: Payer: Self-pay | Admitting: Cardiology

## 2021-04-06 ENCOUNTER — Other Ambulatory Visit: Payer: Self-pay

## 2021-04-06 ENCOUNTER — Ambulatory Visit
Admission: RE | Admit: 2021-04-06 | Discharge: 2021-04-06 | Disposition: A | Discharge status: Home / Self Care | Payer: 59 | Source: Ambulatory Visit | Attending: Physician Assistant | Admitting: Physician Assistant

## 2021-04-06 DIAGNOSIS — M5412 Radiculopathy, cervical region: Secondary | ICD-10-CM

## 2021-04-10 ENCOUNTER — Encounter: Payer: Self-pay | Admitting: Physician Assistant

## 2021-04-10 NOTE — Telephone Encounter (Signed)
I have not gotten results yet

## 2021-04-13 ENCOUNTER — Encounter: Payer: Self-pay | Admitting: Neurology

## 2021-04-17 ENCOUNTER — Encounter: Payer: Self-pay | Admitting: Physician Assistant

## 2021-04-19 ENCOUNTER — Other Ambulatory Visit: Payer: Self-pay | Admitting: Physician Assistant

## 2021-04-19 ENCOUNTER — Other Ambulatory Visit: Payer: Self-pay

## 2021-04-19 DIAGNOSIS — M5412 Radiculopathy, cervical region: Secondary | ICD-10-CM

## 2021-04-19 MED ORDER — METOPROLOL TARTRATE 25 MG PO TABS: 25.0000 mg | ORAL_TABLET | Freq: Two times a day (BID) | ORAL | 3 refills | Status: DC

## 2021-04-19 NOTE — Telephone Encounter (Signed)
She just needs to let us know what neurosurgeon to refer to

## 2021-04-19 NOTE — Telephone Encounter (Signed)
Please make note in chart that she did not ask for any recommendations from our office - she actually stated she wanted her cardiologist to give her a recommendation We will refer her to this neurosurgeon as she has requested

## 2021-04-20 ENCOUNTER — Other Ambulatory Visit: Payer: Self-pay | Admitting: Physician Assistant

## 2021-04-20 DIAGNOSIS — M5412 Radiculopathy, cervical region: Secondary | ICD-10-CM

## 2021-05-01 ENCOUNTER — Telehealth: Payer: Self-pay | Admitting: Allergy and Immunology

## 2021-05-01 DIAGNOSIS — K219 Gastro-esophageal reflux disease without esophagitis: Secondary | ICD-10-CM

## 2021-05-01 DIAGNOSIS — T17308A Unspecified foreign body in larynx causing other injury, initial encounter: Secondary | ICD-10-CM

## 2021-05-01 NOTE — Telephone Encounter (Signed)
Patient states since December some of her LPRD symptoms have flared up. She feels as if someone is choking her and her throat is hoarse. She states she has a problem swallowing and it hurts when she turns her neck. She's wondering if she needs to come in to see Dr. Neldon Mc soon or if she needs to follow up with an ENT, neurologist, etc.

## 2021-05-01 NOTE — Telephone Encounter (Signed)
Please advise 

## 2021-05-02 NOTE — Telephone Encounter (Signed)
Patient states she does not think her problem is LPR. Patient would like to see ENT again. She has seen Dr. Gaylyn Cheers in the past. Is it ok for [place] to do the referral again?   Patient states she saw neurosurgeon last week and got an MRI done.

## 2021-05-02 NOTE — Telephone Encounter (Signed)
If her problem is LPR she can increase her proton pump inhibitor to twice a day and make sure she is consistently using famotidine in the evening.  And if she continues to have a problem in the face of this therapy she can visit with ENT.  Which ENT has she seen in the past?  The fact that her throat bothers her when she turns her head is not LPR.  This is some other abnormality and if that issue continues she very well could require evaluation with her neurologist once again.

## 2021-05-04 NOTE — Telephone Encounter (Signed)
Referral has been faxed over and patient was informed

## 2021-05-07 ENCOUNTER — Other Ambulatory Visit: Payer: Self-pay | Admitting: Physician Assistant

## 2021-05-08 ENCOUNTER — Other Ambulatory Visit: Payer: Self-pay | Admitting: Physician Assistant

## 2021-05-08 ENCOUNTER — Encounter: Payer: Self-pay | Admitting: Allergy and Immunology

## 2021-05-08 ENCOUNTER — Encounter: Payer: Self-pay | Admitting: Physician Assistant

## 2021-05-08 MED ORDER — OMEPRAZOLE 40 MG PO CPDR: 40.0000 mg | DELAYED_RELEASE_CAPSULE | Freq: Every day | ORAL | 2 refills | Status: DC

## 2021-05-08 MED ORDER — DOXYCYCLINE HYCLATE 50 MG PO CAPS: 50.0000 mg | ORAL_CAPSULE | Freq: Two times a day (BID) | ORAL | 2 refills | Status: DC

## 2021-05-08 MED ORDER — BUPROPION HCL ER (XL) 300 MG PO TB24: 300.0000 mg | ORAL_TABLET | Freq: Every day | ORAL | 2 refills | Status: DC

## 2021-05-10 ENCOUNTER — Other Ambulatory Visit: Payer: Self-pay | Admitting: Allergy and Immunology

## 2021-05-10 DIAGNOSIS — T17308A Unspecified foreign body in larynx causing other injury, initial encounter: Secondary | ICD-10-CM

## 2021-05-10 DIAGNOSIS — K219 Gastro-esophageal reflux disease without esophagitis: Secondary | ICD-10-CM

## 2021-05-12 ENCOUNTER — Encounter: Payer: Self-pay | Admitting: Cardiology

## 2021-05-25 ENCOUNTER — Encounter: Payer: Self-pay | Admitting: Allergy and Immunology

## 2021-05-25 ENCOUNTER — Ambulatory Visit: Payer: 59 | Admitting: Allergy and Immunology

## 2021-05-25 ENCOUNTER — Other Ambulatory Visit: Payer: Self-pay

## 2021-05-25 VITALS — BP 112/78 | HR 69 | Resp 16 | Ht 65.0 in | Wt 172.0 lb

## 2021-05-25 DIAGNOSIS — J3089 Other allergic rhinitis: Secondary | ICD-10-CM

## 2021-05-25 DIAGNOSIS — R29818 Other symptoms and signs involving the nervous system: Secondary | ICD-10-CM

## 2021-05-25 DIAGNOSIS — K219 Gastro-esophageal reflux disease without esophagitis: Secondary | ICD-10-CM

## 2021-05-25 DIAGNOSIS — L719 Rosacea, unspecified: Secondary | ICD-10-CM

## 2021-05-25 DIAGNOSIS — J4530 Mild persistent asthma, uncomplicated: Secondary | ICD-10-CM | POA: Diagnosis not present

## 2021-05-25 NOTE — Progress Notes (Signed)
? ?[place] ? ? ?Follow-up Note ? ?Referring Provider: Marge Duncans, PA-C ?Primary Provider: Marge Duncans, PA-C ?Date of Office Visit: 05/25/2021 ? ?Subjective:  ? ?Yolanda Sanchez (DOB: 09/10/1960) is a 61 y.o. female who returns to the [facility] on 05/25/2021 in re-evaluation of the following: ? ?HPI: Yolanda Sanchez presents to this clinic in evaluation of asthma and rhinitis and LPR and rosacea.  Her last visit to this clinic was 24 November 2020. ? ?She is not really having much problem with her airway.  She feels as though his nose is doing well and she had no problems with asthma and does not use a short acting bronchodilator. ? ?She thinks that her reflux is under very good control. ? ?She believes that her rosacea is under very good control. ? ?She had a few new medical issues that have developed.  When she was last seen in this clinic she told me about this left-sided neurological problem that was affecting her face and sometimes her left arm and leg.  She notes that in November 2022 she started to develop "quivering" of her arms and legs.  And when she turns her neck left or right she gets this sensation as though her throat is "being pulled on" and she has had a few episodes of choking.  She did visit with a neurosurgeon who performed an MRI of her neck and apparently she does have some degenerative disc disease with some stenosis but nothing that appears to be a reason for her symptoms.  She does have an appointment to see ENT on 06 June 2021. ? ?And, she apparently developed an SVT and saw Dr. Agustin Cree, cardiology, and was placed on metoprolol with resolution of her palpitations. ? ?Allergies as of 05/25/2021   ? ?   Reactions  ? Ranexa [ranolazine]   ? 10-20-19  ? ?  ? ?  ?Medication List  ? ? ?albuterol 108 (90 Base) MCG/ACT inhaler ?Commonly known as: VENTOLIN HFA ?Inhale 2 puffs into the lungs every 6 (six) hours as needed for  wheezing or shortness of breath. ?  ?aspirin EC 81 MG tablet ?Take 1 tablet (81 mg total) by mouth daily. Swallow whole. ?  ?atorvastatin 10 MG tablet ?Commonly known as: LIPITOR ?Take 1 tablet (10 mg total) by mouth daily. ?  ?buPROPion 300 MG 24 hr tablet ?Commonly known as: WELLBUTRIN XL ?Take 1 tablet (300 mg total) by mouth daily. ?  ?doxycycline 50 MG capsule ?Commonly known as: VIBRAMYCIN ?Take 1 capsule (50 mg total) by mouth 2 (two) times daily. ?  ?DULoxetine 30 MG capsule ?Commonly known as: CYMBALTA ?Take 1 capsule (30 mg total) by mouth 2 (two) times daily. ?  ?famotidine 40 MG tablet ?Commonly known as: PEPCID ?Take 40 mg by mouth daily. ?  ?fluticasone 50 MCG/ACT nasal spray ?Commonly known as: FLONASE ?USE 2 SPRAYS INTO BOTH NOSTRILS DAILY ASNEEDED FOR ALLERGIES OR RHINITIS ?  ?lisinopril-hydrochlorothiazide 10-12.5 MG tablet ?Commonly known as: ZESTORETIC ?Take 0.5 tablets by mouth daily. ?  ?meloxicam 7.5 MG tablet ?Commonly known as: MOBIC ?Take 7.5 mg by mouth daily. As needed. ?  ?metoprolol tartrate 25 MG tablet ?Commonly known as: LOPRESSOR ?Take 1 tablet (25 mg total) by mouth 2 (two) times daily. ?  ?metroNIDAZOLE 0.75 % cream ?Commonly known as: METROCREAM ?Apply topically 2 (two) times daily. ?  ?omeprazole 40 MG capsule ?Commonly known as: PRILOSEC ?Take 1 capsule (40 mg total) by  mouth daily. ?  ? ?Past Medical History:  ?Diagnosis Date  ? Abnormal stress test 11/10/2019  ? Anxiety   ? Asthma   ? Atypical chest pain 09/15/2019  ? Chest tightness 11/26/2019  ? Depression   ? Gait abnormality 11/03/2019  ? HSV infection   ? 1 & 2   ? Hypercholesterolemia 09/11/2018  ? Hypertension   ? Left facial numbness 10/25/2019  ? Mixed hyperlipidemia 11/03/2019  ? Need for prophylactic vaccination and inoculation against influenza 03/01/2020  ? Need for tetanus, diphtheria, and acellular pertussis (Tdap) vaccine 03/01/2020  ? Obstructive sleep apnea 09/11/2018  ? Palpitations 09/15/2019  ? Paresthesia 11/03/2019   ? Swelling of both lower extremities 09/11/2018  ? ? ?Past Surgical History:  ?Procedure Laterality Date  ? BREAST BIOPSY Left   ? CHOLECYSTECTOMY    ? COLONOSCOPY    ? TUBAL LIGATION    ? ? ?Review of systems negative except as noted in HPI / PMHx or noted below: ? ?Review of Systems  ?Constitutional: Negative.   ?HENT: Negative.    ?Eyes: Negative.   ?Respiratory: Negative.    ?Cardiovascular: Negative.   ?Gastrointestinal: Negative.   ?Genitourinary: Negative.   ?Musculoskeletal: Negative.   ?Skin: Negative.   ?Neurological: Negative.   ?Endo/Heme/Allergies: Negative.   ?Psychiatric/Behavioral: Negative.    ? ? ?Objective:  ? ?Vitals:  ? 05/25/21 1604  ?BP: 112/78  ?Pulse: 69  ?Resp: 16  ?SpO2: 98%  ? ?Height: '5\' 5"'$  (165.1 cm)  ?Weight: 172 lb (78 kg)  ? ?Physical Exam ?Constitutional:   ?   Appearance: She is not diaphoretic.  ?HENT:  ?   Head: Normocephalic.  ?   Right Ear: Tympanic membrane, ear canal and external ear normal.  ?   Left Ear: Tympanic membrane, ear canal and external ear normal.  ?   Nose: Nose normal. No mucosal edema or rhinorrhea.  ?   Mouth/Throat:  ?   Pharynx: Uvula midline. No oropharyngeal exudate.  ?Eyes:  ?   Conjunctiva/sclera: Conjunctivae normal.  ?Neck:  ?   Thyroid: No thyromegaly.  ?   Trachea: Trachea normal. No tracheal tenderness or tracheal deviation.  ?Cardiovascular:  ?   Rate and Rhythm: Normal rate and regular rhythm.  ?   Heart sounds: Normal heart sounds, S1 normal and S2 normal. No murmur heard. ?Pulmonary:  ?   Effort: No respiratory distress.  ?   Breath sounds: Normal breath sounds. No stridor. No wheezing or rales.  ?Lymphadenopathy:  ?   Head:  ?   Right side of head: No tonsillar adenopathy.  ?   Left side of head: No tonsillar adenopathy.  ?   Cervical: No cervical adenopathy.  ?Skin: ?   Findings: No erythema or rash.  ?   Nails: There is no clubbing.  ?Neurological:  ?   Mental Status: She is alert.  ? ? ?Diagnostics:  ?  ?Spirometry was performed and  demonstrated an FEV1 of 2.59 at 99 % of predicted. ? ?Results of a Cervical spine MRI obtained 06 April 2021 demonstrated the following: ? ?1. Multilevel degenerative changes throughout the cervical spine as ?detailed above. Findings result in up to severe left neural ?foraminal stenosis at C3-C4, and moderate left neural foraminal ?stenosis at C4-C5 and C5-C6. These findings are overall not ?significantly changed since 2021. ?2. Mild right neural foraminal stenosis at C6-C7 may have slightly ?progressed since 2021. ?3. No significant spinal canal stenosis. ? ?Results of a Brain MRI obtained 02 March 2021 identified the following: ? ?No evidence of acute intracranial abnormality.  ?There are a few small (measuring up to 4 mm) nonspecific T2 FLAIR hyperintense remote insults within the bilateral cerebral white matter. These findings are stable as compared to the brain MRI of 10/25/2019.  ?Unchanged 4 mm pineal cyst.  ?Otherwise unremarkable MRI appearance of the brain. ? ?Assessment and Plan:  ? ?1. Asthma, well controlled, mild persistent   ?2. Perennial allergic rhinitis   ?3. LPRD (laryngopharyngeal reflux disease)   ?4. Rosacea   ?5. Neurological abnormality   ? ? ?1. Continue to Treat and prevent inflammation: ? ? A. Flonase - 1 spray each nostril 1-2 times per day ? ?2. Treat and prevent reflux / LPR: ? ? A. Minimize all forms of caffeiene ? B. Omeprazole 40 mg - 1 tablet in AM ? C. Famotidine 40 mg - 1 tablet in PM  ? ?3. If needed: ? ? A. Albuterol HFA - 2 inhalations every 4-6 hours ? ?4. Treat and prevent rosacea: ? ? A.  Metrocream applied to face 2 times per day ? B.  Doxycycline 50 mg - 1 time per day  ? ?5. Keep ENT appointment ? ?6. Further evaluation and treatment??? ? ?7. Return to clinic in 6 months or earlier if problem ? ?From a airway standpoint and a reflux standpoint Carl is doing quite well on her current plan and she will remain on anti-inflammatory agents for her airway and  aggressive therapy directed against reflux.  In addition, her rosacea is under very good control on her current plan.  She has a left-sided neurological issue that is poorly defined and now she complains of having "quivering

## 2021-05-25 NOTE — Patient Instructions (Addendum)
?  1. Continue to Treat and prevent inflammation: ? ? A. Flonase - 1 spray each nostril 1-2 times per day ? ?2. Treat and prevent reflux / LPR: ? ? A. Minimize all forms of caffeiene ? B. Omeprazole 40 mg - 1 tablet in AM ? C. Famotidine 40 mg - 1 tablet in PM  ? ?3. If needed: ? ? A. Albuterol HFA - 2 inhalations every 4-6 hours ? ?4. Treat and prevent rosacea: ? ? A.  Metrocream applied to face 2 times per day ? B.  Doxycycline 50 mg - 1 time per day  ? ?5. Keep ENT appointment ? ?6. Further evaluation and treatment??? ? ?7. Return to clinic in 6 months or earlier if problem ?

## 2021-05-29 ENCOUNTER — Encounter: Payer: Self-pay | Admitting: Allergy and Immunology

## 2021-06-06 DIAGNOSIS — K219 Gastro-esophageal reflux disease without esophagitis: Secondary | ICD-10-CM | POA: Insufficient documentation

## 2021-06-06 DIAGNOSIS — J342 Deviated nasal septum: Secondary | ICD-10-CM | POA: Insufficient documentation

## 2021-06-06 DIAGNOSIS — R1310 Dysphagia, unspecified: Secondary | ICD-10-CM | POA: Insufficient documentation

## 2021-06-07 ENCOUNTER — Encounter: Payer: Self-pay | Admitting: Physician Assistant

## 2021-06-07 ENCOUNTER — Other Ambulatory Visit: Payer: Self-pay | Admitting: Physician Assistant

## 2021-06-07 DIAGNOSIS — R5382 Chronic fatigue, unspecified: Secondary | ICD-10-CM

## 2021-06-07 DIAGNOSIS — F3289 Other specified depressive episodes: Secondary | ICD-10-CM

## 2021-06-07 MED ORDER — DULOXETINE HCL 30 MG PO CPEP: 30.0000 mg | ORAL_CAPSULE | Freq: Two times a day (BID) | ORAL | 2 refills | Status: DC

## 2021-06-07 NOTE — Telephone Encounter (Signed)
In her chart it is noted as cymbalta '30mg'$  bid -- do not know if pharmacy is changing this but was ordered as written ?Will send is as it is written in her chart --- if she gets it dispensed differently needs to discuss with pharmacy ?

## 2021-06-07 NOTE — Telephone Encounter (Signed)
As stated in our system it showed '30mg'$  bid in med list - the last fill in November with refills in addition to me just sending in today ?Regardless - same dose ? ?

## 2021-06-22 ENCOUNTER — Encounter: Payer: Self-pay | Admitting: Cardiology

## 2021-07-03 ENCOUNTER — Other Ambulatory Visit: Payer: Self-pay | Admitting: Physician Assistant

## 2021-07-05 ENCOUNTER — Ambulatory Visit: Payer: 59 | Admitting: Cardiology

## 2021-07-05 ENCOUNTER — Encounter: Payer: Self-pay | Admitting: Cardiology

## 2021-07-05 VITALS — BP 108/72 | HR 75 | Ht 65.0 in | Wt 174.6 lb

## 2021-07-05 DIAGNOSIS — I10 Essential (primary) hypertension: Secondary | ICD-10-CM | POA: Diagnosis not present

## 2021-07-05 DIAGNOSIS — R002 Palpitations: Secondary | ICD-10-CM

## 2021-07-05 DIAGNOSIS — R0789 Other chest pain: Secondary | ICD-10-CM

## 2021-07-05 DIAGNOSIS — R5382 Chronic fatigue, unspecified: Secondary | ICD-10-CM | POA: Diagnosis not present

## 2021-07-05 NOTE — Progress Notes (Signed)
?Cardiology Office Note:   ? ?Date:  07/05/2021  ? ?ID:  Yolanda Sanchez, DOB Aug 06, 1960, MRN [id number] ? ?PCP:  Marge Duncans, PA-C  ?Cardiologist:  Jenne Campus, MD   ? ?Referring MD: Marge Duncans, PA-C  ? ?Chief Complaint  ?Patient presents with  ? same sx's as previous visit  ? ? ?History of Present Illness:   ? ?Yolanda Sanchez is a 61 y.o. female with past medical history significant for atypical chest pain, CAD work-up negative, syncope and collapse, monitor shows some SVT as well as run of nonsustained ventricular tachycardia but ejection fraction is preserved.  Comes today 2 months of follow-up overall still doing the same have rare palpitations she said when she gets very quickly She gets dizzy she read something about pots syndrome and when I discussed this which she may have some component f of it.  Denies having any chest pain tightness squeezing pressure burning chest ? ?Past Medical History:  ?Diagnosis Date  ? Abnormal stress test 11/10/2019  ? Anxiety   ? Asthma   ? Atypical chest pain 09/15/2019  ? Chest tightness 11/26/2019  ? Depression   ? Gait abnormality 11/03/2019  ? HSV infection   ? 1 & 2   ? Hypercholesterolemia 09/11/2018  ? Hypertension   ? Left facial numbness 10/25/2019  ? Mixed hyperlipidemia 11/03/2019  ? Need for prophylactic vaccination and inoculation against influenza 03/01/2020  ? Need for tetanus, diphtheria, and acellular pertussis (Tdap) vaccine 03/01/2020  ? Obstructive sleep apnea 09/11/2018  ? Palpitations 09/15/2019  ? Paresthesia 11/03/2019  ? Swelling of both lower extremities 09/11/2018  ? ? ?Past Surgical History:  ?Procedure Laterality Date  ? BREAST BIOPSY Left   ? CHOLECYSTECTOMY    ? COLONOSCOPY    ? TUBAL LIGATION    ? ? ?Current Medications: ?Current Meds  ?Medication Sig  ? albuterol (VENTOLIN HFA) 108 (90 Base) MCG/ACT inhaler Inhale 2 puffs into the lungs every 6 (six) hours as needed for wheezing or shortness of breath.  ? aspirin EC 81 MG tablet Take 1 tablet  (81 mg total) by mouth daily. Swallow whole.  ? atorvastatin (LIPITOR) 10 MG tablet Take 1 tablet (10 mg total) by mouth daily.  ? buPROPion (WELLBUTRIN XL) 300 MG 24 hr tablet Take 1 tablet (300 mg total) by mouth daily.  ? doxycycline (VIBRAMYCIN) 50 MG capsule Take 1 capsule (50 mg total) by mouth 2 (two) times daily.  ? DULoxetine (CYMBALTA) 30 MG capsule Take 1 capsule (30 mg total) by mouth 2 (two) times daily.  ? famotidine (PEPCID) 40 MG tablet Take 40 mg by mouth daily.  ? fluticasone (FLONASE) 50 MCG/ACT nasal spray USE 2 SPRAYS INTO BOTH NOSTRILS DAILY ASNEEDED FOR ALLERGIES OR RHINITIS (Patient taking differently: Place 2 sprays into both nostrils as needed for allergies or rhinitis.)  ? lisinopril-hydrochlorothiazide (ZESTORETIC) 10-12.5 MG tablet Take 0.5 tablets by mouth daily.  ? meloxicam (MOBIC) 7.5 MG tablet Take 7.5 mg by mouth daily. As needed.  ? metoprolol tartrate (LOPRESSOR) 25 MG tablet Take 1 tablet (25 mg total) by mouth 2 (two) times daily.  ? metroNIDAZOLE (METROCREAM) 0.75 % cream Apply topically 2 (two) times daily. (Patient taking differently: Apply 1 application. topically 2 (two) times daily.)  ? omeprazole (PRILOSEC) 40 MG capsule Take 1 capsule (40 mg total) by mouth daily.  ?  ? ?Allergies:   Ranexa [ranolazine]  ? ?Social History  ? ?Socioeconomic History  ? Marital status: Divorced  ?  Spouse name: Not on file  ? Number of children: 2  ? Years of education: Not on file  ? Highest education level: Not on file  ?Occupational History  ? Not on file  ?Tobacco Use  ? Smoking status: Former  ?  Types: Cigarettes  ?  Quit date: 2019  ?  Years since quitting: 4.3  ? Smokeless tobacco: Never  ? Tobacco comments:  ?  on and off for 20 years   ?Vaping Use  ? Vaping Use: Former  ?Substance and Sexual Activity  ? Alcohol use: Yes  ?  Alcohol/week: 1.0 - 2.0 standard drink  ?  Types: 1 - 2 Standard drinks or equivalent per week  ? Drug use: Yes  ?  Types: Marijuana  ?  Comment: at night for  insomnia sometimes   ? Sexual activity: Not on file  ?Other Topics Concern  ? Not on file  ?Social History Narrative  ? Lives alone   ? Right handed  ? Caffeine: 1 cup/day  ? ?Social Determinants of Health  ? ?Financial Resource Strain: Not on file  ?Food Insecurity: Not on file  ?Transportation Needs: Not on file  ?Physical Activity: Not on file  ?Stress: Not on file  ?Social Connections: Not on file  ?  ? ?Family History: ?The patient's family history includes Atrial fibrillation in her sister; Colon cancer in her paternal grandfather; Dementia in her paternal grandmother; Depression in her mother; Diabetes in her brother, brother, and son; Heart attack in her maternal grandfather and maternal grandmother; Heart disease in her maternal grandmother; Heart failure in her sister; Hyperlipidemia in her brother, father, and mother; Hypertension in her brother, brother, and mother; Myelodysplastic syndrome in her brother; Skin cancer in her father; Sleep apnea in her brother. There is no history of Allergic rhinitis, Angioedema, Asthma, Atopy, Eczema, Immunodeficiency, or Urticaria. ?ROS:   ?Please see the history of present illness.    ?All 14 point review of systems negative except as described per history of present illness ? ?EKGs/Labs/Other Studies Reviewed:   ? ? ? ?Recent Labs: ?02/14/2021: Magnesium 2.3 ?03/23/2021: ALT 25; BUN 16; Creatinine, Ser 0.88; Hemoglobin 14.5; Platelets 189; Potassium 4.3; Sodium 144; TSH 0.877  ?Recent Lipid Panel ?   ?Component Value Date/Time  ? CHOL 147 03/23/2021 0942  ? TRIG 57 03/23/2021 0942  ? HDL 67 03/23/2021 0942  ? CHOLHDL 2.2 03/23/2021 0942  ? [place] 68 03/23/2021 0942  ? ? ?Physical Exam:   ? ?VS:  BP 108/72 (BP Location: Left Arm, Patient Position: Sitting)   Pulse 75   Ht '5\' 5"'$  (1.651 m)   Wt 174 lb 9.6 oz (79.2 kg)   SpO2 96%   BMI 29.05 kg/m?    ? ?Wt Readings from Last 3 Encounters:  ?07/05/21 174 lb 9.6 oz (79.2 kg)  ?05/25/21 172 lb (78 kg)  ?03/23/21 168 lb  (76.2 kg)  ?  ? ?GEN:  Well nourished, well developed in no acute distress ?HEENT: Normal ?NECK: No JVD; No carotid bruits ?LYMPHATICS: No lymphadenopathy ?CARDIAC: RRR, no murmurs, no rubs, no gallops ?RESPIRATORY:  Clear to auscultation without rales, wheezing or rhonchi  ?ABDOMEN: Soft, non-tender, non-distended ?MUSCULOSKELETAL:  No edema; No deformity  ?SKIN: Warm and dry ?LOWER EXTREMITIES: no swelling ?NEUROLOGIC:  Alert and oriented x 3 ?PSYCHIATRIC:  Normal affect  ? ?ASSESSMENT:   ? ?1. Primary hypertension   ?2. Atypical chest pain   ?3. Palpitations   ?4. Chronic fatigue   ? ?PLAN:   ? ?  In order of problems listed above: ? ?Essential hypertension blood pressure well controlled continue present management. ?Atypical chest pain: Denies having any, coronary CT angio was normal ?Palpitations still present but managed sufficiently with small dose of beta-blocker which I will continue ?Chronic fatigue with talk in length about this I recommended to start exercising on the regular basis which should help with the symptoms. ? ? ?Medication Adjustments/Labs and Tests Ordered: ?Current medicines are reviewed at length with the patient today.  Concerns regarding medicines are outlined above.  ?No orders of the defined types were placed in this encounter. ? ?Medication changes: No orders of the defined types were placed in this encounter. ? ? ?Signed, ?Park Liter, MD, [facility] ?07/05/2021 1:39 PM    ?[place] ?

## 2021-07-05 NOTE — Patient Instructions (Signed)
Medication Instructions:  Your physician recommends that you continue on your current medications as directed. Please refer to the Current Medication list given to you today.  *If you need a refill on your cardiac medications before your next appointment, please call your pharmacy*   Lab Work: None If you have labs (blood work) drawn today and your tests are completely normal, you will receive your results only by: MyChart Message (if you have MyChart) OR A paper copy in the mail If you have any lab test that is abnormal or we need to change your treatment, we will call you to review the results.   Testing/Procedures: None   Follow-Up: At [facility], you and your health needs are our priority.  As part of our continuing mission to provide you with exceptional heart care, we have created designated Provider Care Teams.  These Care Teams include your primary Cardiologist (physician) and Advanced Practice Providers (APPs -  Physician Assistants and Nurse Practitioners) who all work together to provide you with the care you need, when you need it.  We recommend signing up for the patient portal called "MyChart".  Sign up information is provided on this After Visit Summary.  MyChart is used to connect with patients for Virtual Visits (Telemedicine).  Patients are able to view lab/test results, encounter notes, upcoming appointments, etc.  Non-urgent messages can be sent to your provider as well.   To learn more about what you can do with MyChart, go to [email or web address].    Your next appointment:   5 month(s)  The format for your next appointment:   In Person  Provider:   Robert Krasowski, MD    Other Instructions   Important Information About Sugar       

## 2021-07-20 HISTORY — PX: DG BARIUM SWALLOW (ARMC HX): HXRAD1448

## 2021-07-24 ENCOUNTER — Encounter: Payer: Self-pay | Admitting: Allergy and Immunology

## 2021-07-26 ENCOUNTER — Encounter: Payer: Self-pay | Admitting: Physician Assistant

## 2021-07-31 ENCOUNTER — Encounter: Payer: Self-pay | Admitting: Physician Assistant

## 2021-07-31 ENCOUNTER — Ambulatory Visit: Payer: 59 | Admitting: Physician Assistant

## 2021-08-15 ENCOUNTER — Other Ambulatory Visit: Payer: Self-pay | Admitting: Physician Assistant

## 2021-08-15 MED ORDER — FAMOTIDINE 40 MG PO TABS: 40.0000 mg | ORAL_TABLET | Freq: Every day | ORAL | 1 refills | Status: DC

## 2021-08-23 ENCOUNTER — Ambulatory Visit: Payer: 59 | Admitting: Nurse Practitioner

## 2021-08-23 ENCOUNTER — Encounter: Payer: Self-pay | Admitting: Nurse Practitioner

## 2021-08-23 VITALS — BP 118/70 | HR 75 | Temp 99.1°F | Resp 18 | Wt 172.6 lb

## 2021-08-23 DIAGNOSIS — W540XXA Bitten by dog, initial encounter: Secondary | ICD-10-CM | POA: Diagnosis not present

## 2021-08-23 DIAGNOSIS — S81852A Open bite, left lower leg, initial encounter: Secondary | ICD-10-CM | POA: Diagnosis not present

## 2021-08-23 MED ORDER — SULFAMETHOXAZOLE-TRIMETHOPRIM 800-160 MG PO TABS: 1.0000 | ORAL_TABLET | Freq: Two times a day (BID) | ORAL | 0 refills | Status: DC

## 2021-08-23 NOTE — Patient Instructions (Addendum)
Take Bactrim-DS twice daily for 7 days as prescribed Hold Doxycycline 50 mg while taking Bactrim Follow-up as needed   Animal Bite, Adult Animal bites can be mild or serious. Small bites from house pets normally are mild. Bites from cats, strays, or wild animals can be serious. If a stray or wild animal bites you, you need to get medical help right away. You may also need a shot to prevent rabies infection. What increases the risk? Being near pets you do not know. Being near animals that are eating, sleeping, or caring for their babies. Being outside where small, wild animals move freely. What are the signs or symptoms? Pain. Bleeding. Swelling. Bruising. How is this treated? Treatment may include: Cleaning your wound. Rinsing out (flushing) your wound. This uses saline solution, which is made of salt and water. Putting a bandage on your wound. Closing your wound with stitches (sutures), staples, skin glue, or skin tape (adhesive strips). Antibiotic medicine. You may be given pills, cream, gel, or fluid through an IV. A tetanus shot. Rabies treatment, if the animal could have rabies. Surgery, if there is infection or damage that needs to be fixed. Follow these instructions at home: Medicines Take or apply over-the-counter and prescription medicines only as told by your doctor. If you were prescribed an antibiotic medicine, take or apply it as told by your doctor. Do not stop using it even if your wound gets better. Wound care  Follow instructions from your doctor about how to take care of your wound. Make sure you: Wash your hands with soap and water for at least 20 seconds before and after you change your bandage. If you cannot use soap and water, use hand sanitizer. Change your bandage. Leave stitches or skin glue in place for at least 2 weeks. Leave tape strips alone unless you are told to take them off. You may trim the edges of the tape strips if they curl up. Check your  wound every day for signs of infection. Check for: More redness, swelling, or pain. More fluid or blood. Warmth. Pus or a bad smell. General instructions  Raise (elevate) the injured area above the level of your heart while you are sitting or lying down. If told, put ice on the injured area. To do this: Put ice in a plastic bag. Place a towel between your skin and the bag. Leave the ice on for 20 minutes, 2-3 times per day. Take off the ice if your skin turns bright red. This is very important. If you cannot feel pain, heat, or cold, you have a greater risk of damage to the area. Keep all follow-up visits. Contact a doctor if: You have more redness, swelling, or pain around your wound. Your wound feels warm to the touch. You have a fever or chills. You have a general feeling of sickness (malaise). You feel like you may vomit. You vomit. You have pain that does not get better. Get help right away if: You have a red streak going away from your wound. You have any of these coming from your wound: Non-clear fluid. More blood. Pus or a bad smell. You have trouble moving your injured area. You lose feeling (have numbness) or feel tingling anywhere on your body. Summary Animal bites can be mild or serious. If a stray or wild animal bites you, you need to get medical help right away. Your doctor will look at the wound and may ask about how the animal bite happened. Treatment may include  wound care, antibiotic medicine, a tetanus shot, and rabies treatment. This information is not intended to replace advice given to you by your health care provider. Make sure you discuss any questions you have with your health care provider. Document Revised: 03/03/2021 Document Reviewed: 03/03/2021 Elsevier Patient Education  [place].

## 2021-08-23 NOTE — Progress Notes (Signed)
Acute Office Visit  Subjective:    Patient ID: Yolanda Sanchez, female    DOB: 1960-09-18, 61 y.o.   MRN: [id number]  Chief Complaint  Patient presents with   Animal Bite    Dog     HPI: Patient is in today for dog bite to left lower leg. Occurred on 08/17/21. She was responding to her sister's house to let emergency team into house, three dogs were in the home. Sister passed. She was made aware on 08/22/21 that dog does not have his immunizations. Currently taking doxycycline prescribed for rosacea, takes this daily.   Past Medical History:  Diagnosis Date   Abnormal stress test 11/10/2019   Anxiety    Asthma    Atypical chest pain 09/15/2019   Chest tightness 11/26/2019   Depression    Gait abnormality 11/03/2019   HSV infection    1 & 2    Hypercholesterolemia 09/11/2018   Hypertension    Left facial numbness 10/25/2019   Mixed hyperlipidemia 11/03/2019   Need for prophylactic vaccination and inoculation against influenza 03/01/2020   Need for tetanus, diphtheria, and acellular pertussis (Tdap) vaccine 03/01/2020   Obstructive sleep apnea 09/11/2018   Palpitations 09/15/2019   Paresthesia 11/03/2019   Swelling of both lower extremities 09/11/2018    Past Surgical History:  Procedure Laterality Date   BREAST BIOPSY Left    CHOLECYSTECTOMY     COLONOSCOPY     TUBAL LIGATION      Family History  Problem Relation Age of Onset   Hyperlipidemia Mother    Hypertension Mother    Depression Mother    Hyperlipidemia Father    Skin cancer Father    Hypertension Brother    Diabetes Brother    Myelodysplastic syndrome Brother    Heart attack Maternal Grandmother    Heart disease Maternal Grandmother    Heart attack Maternal Grandfather    Dementia Paternal Grandmother    Colon cancer Paternal Grandfather    Hypertension Brother    Hyperlipidemia Brother    Sleep apnea Brother    Diabetes Brother    Diabetes Son    Heart failure Sister    Atrial fibrillation Sister     Allergic rhinitis Neg Hx    Angioedema Neg Hx    Asthma Neg Hx    Atopy Neg Hx    Eczema Neg Hx    Immunodeficiency Neg Hx    Urticaria Neg Hx     Social History   Socioeconomic History   Marital status: Divorced    Spouse name: Not on file   Number of children: 2   Years of education: Not on file   Highest education level: Not on file  Occupational History   Not on file  Tobacco Use   Smoking status: Former    Types: Cigarettes    Quit date: 2019    Years since quitting: 4.4   Smokeless tobacco: Never   Tobacco comments:    on and off for 20 years   Vaping Use   Vaping Use: Former  Substance and Sexual Activity   Alcohol use: Yes    Alcohol/week: 1.0 - 2.0 standard drink of alcohol    Types: 1 - 2 Standard drinks or equivalent per week   Drug use: Yes    Types: Marijuana    Comment: at night for insomnia sometimes    Sexual activity: Yes  Other Topics Concern   Not on file  Social History Narrative  Lives alone    Right handed   Caffeine: 1 cup/day   Social Determinants of Health   Financial Resource Strain: Not on file  Food Insecurity: Not on file  Transportation Needs: Not on file  Physical Activity: Not on file  Stress: Not on file  Social Connections: Not on file  Intimate Partner Violence: Not on file    Outpatient Medications Prior to Visit  Medication Sig Dispense Refill   albuterol (VENTOLIN HFA) 108 (90 Base) MCG/ACT inhaler Inhale 2 puffs into the lungs every 6 (six) hours as needed for wheezing or shortness of breath.     aspirin EC 81 MG tablet Take 1 tablet (81 mg total) by mouth daily. Swallow whole. 90 tablet 3   atorvastatin (LIPITOR) 10 MG tablet Take 1 tablet (10 mg total) by mouth daily. 90 tablet 3   buPROPion (WELLBUTRIN XL) 300 MG 24 hr tablet Take 1 tablet (300 mg total) by mouth daily. 30 tablet 2   doxycycline (VIBRAMYCIN) 50 MG capsule Take 1 capsule (50 mg total) by mouth 2 (two) times daily. 60 capsule 2   DULoxetine  (CYMBALTA) 30 MG capsule Take 1 capsule (30 mg total) by mouth 2 (two) times daily. 60 capsule 2   famotidine (PEPCID) 40 MG tablet Take 1 tablet (40 mg total) by mouth daily. 90 tablet 1   fluticasone (FLONASE) 50 MCG/ACT nasal spray USE 2 SPRAYS INTO BOTH NOSTRILS DAILY ASNEEDED FOR ALLERGIES OR RHINITIS (Patient taking differently: Place 2 sprays into both nostrils as needed for allergies or rhinitis.) 16 g 1   lisinopril-hydrochlorothiazide (ZESTORETIC) 10-12.5 MG tablet Take 0.5 tablets by mouth daily. 45 tablet 3   meloxicam (MOBIC) 7.5 MG tablet Take 7.5 mg by mouth daily. As needed.     metoprolol tartrate (LOPRESSOR) 25 MG tablet Take 1 tablet (25 mg total) by mouth 2 (two) times daily. 180 tablet 3   metroNIDAZOLE (METROCREAM) 0.75 % cream Apply topically 2 (two) times daily. (Patient taking differently: Apply 1 application. topically 2 (two) times daily.) 45 g 5   omeprazole (PRILOSEC) 40 MG capsule Take 1 capsule (40 mg total) by mouth daily. 30 capsule 2   No facility-administered medications prior to visit.    Allergies  Allergen Reactions   Ranexa [Ranolazine]     10-20-19    Review of Systems  Constitutional:  Negative for chills, fatigue and fever.  Skin:  Positive for wound (left lower leg).  Hematological:  Does not bruise/bleed easily.       Objective:    Physical Exam Vitals and nursing note reviewed.  Skin:    General: Skin is warm and dry.     Capillary Refill: Capillary refill takes less than 2 seconds.     Findings: Ecchymosis present.          Comments: Healing scabs to left lower posterior leg, ecchymosis surrounding wound  Neurological:     General: No focal deficit present.     Mental Status: She is oriented to person, place, and time.   BP 118/70   Pulse 75   Temp 99.1 F (37.3 C)   Resp 18   Wt 172 lb 9.6 oz (78.3 kg)   SpO2 98%   BMI 28.72 kg/m   Wt Readings from Last 3 Encounters:  07/05/21 174 lb 9.6 oz (79.2 kg)  05/25/21 172 lb (78  kg)  03/23/21 168 lb (76.2 kg)    Health Maintenance Due  Topic Date Due   Zoster Vaccines- Shingrix (  1 of 2) Never done   PAP SMEAR-Modifier  06/30/2017   COVID-19 Vaccine (4 - Booster for Moderna series) 10/25/2020       Lab Results  Component Value Date   TSH 0.877 03/23/2021   Lab Results  Component Value Date   WBC 5.9 03/23/2021   HGB 14.5 03/23/2021   HCT 41.9 03/23/2021   MCV 91 03/23/2021   PLT 189 03/23/2021   Lab Results  Component Value Date   NA 144 03/23/2021   K 4.3 03/23/2021   CO2 25 03/23/2021   GLUCOSE 86 03/23/2021   BUN 16 03/23/2021   CREATININE 0.88 03/23/2021   BILITOT 0.6 03/23/2021   ALKPHOS 64 03/23/2021   AST 31 03/23/2021   ALT 25 03/23/2021   PROT 7.2 03/23/2021   ALBUMIN 5.0 (H) 03/23/2021   CALCIUM 10.2 03/23/2021   ANIONGAP 6 09/03/2020   EGFR 76 03/23/2021   Lab Results  Component Value Date   CHOL 147 03/23/2021   Lab Results  Component Value Date   HDL 67 03/23/2021   Lab Results  Component Value Date   LDLCALC 68 03/23/2021   Lab Results  Component Value Date   TRIG 57 03/23/2021   Lab Results  Component Value Date   CHOLHDL 2.2 03/23/2021        Assessment & Plan:   1. Dog bite of left lower leg, initial encounter - sulfamethoxazole-trimethoprim (BACTRIM DS) 800-160 MG tablet; Take 1 tablet by mouth 2 (two) times daily.  Dispense: 14 tablet; Refill: 0    Take Bactrim-DS twice daily for 7 days as prescribed Hold Doxycycline 50 mg while taking Bactrim Follow-up as needed    Follow-up: PRN  An After Visit Summary was printed and given to the patient.  I,Victoria C Greene,acting as a Education administrator for [employer], NP.,have documented all relevant documentation on the behalf of Rip Harbour, NP,as directed by  Rip Harbour, NP while in the presence of Rip Harbour, NP.  Signed, Rip Harbour, NP [place] [phone]

## 2021-09-08 ENCOUNTER — Ambulatory Visit: Payer: 59 | Admitting: Physician Assistant

## 2021-09-15 ENCOUNTER — Encounter: Payer: Self-pay | Admitting: Physician Assistant

## 2021-09-15 ENCOUNTER — Ambulatory Visit: Payer: 59 | Admitting: Physician Assistant

## 2021-09-15 VITALS — BP 110/70 | HR 66 | Temp 97.4°F | Ht 65.0 in | Wt 174.2 lb

## 2021-09-15 DIAGNOSIS — F3289 Other specified depressive episodes: Secondary | ICD-10-CM | POA: Diagnosis not present

## 2021-09-15 DIAGNOSIS — I10 Essential (primary) hypertension: Secondary | ICD-10-CM | POA: Diagnosis not present

## 2021-09-15 DIAGNOSIS — E782 Mixed hyperlipidemia: Secondary | ICD-10-CM

## 2021-09-15 DIAGNOSIS — R5382 Chronic fatigue, unspecified: Secondary | ICD-10-CM | POA: Diagnosis not present

## 2021-09-15 DIAGNOSIS — R0789 Other chest pain: Secondary | ICD-10-CM

## 2021-09-15 NOTE — Progress Notes (Signed)
Subjective:  Patient ID: Yolanda Sanchez, female    DOB: August 22, 1960  Age: 61 y.o. MRN: [id number]  Chief Complaint  Patient presents with   Hypertension    Hypertension    Pt presents for follow up of hypertension. The patient is tolerating the medication well without side effects. Compliance with treatment has been good; including taking medication as directed , maintains a healthy diet and regular exercise regimen , and following up as directed. She is currently on zestoretic 10/12.69m qd -  Pt does see cardiology regularly - Dr KAgustin Cree- she states that she has history of SVT and that she has nitro she uses as needed as well for chest pains - states that she has noted some intermittent chest pains/tightness but attributes to issues she is having with family right now and recent loss of sister --- having lots of anxiety  Mixed hyperlipidemia  Pt presents with hyperlipidemia.  The patient is compliant with medications, maintains a low cholesterol diet , follows up as directed , and maintains an exercise regimen . The patient denies experiencing any hypercholesterolemia related symptoms. She is currently on lipitor 130mqd  Pt with history or anxiety/depression -- she is stable on her current medications of cymbalta and wellbutrin --  Pt has seen her ophthalmologist Dr HeLoel Roor blurry vision and other issues     Current Outpatient Medications on File Prior to Visit  Medication Sig Dispense Refill   albuterol (VENTOLIN HFA) 108 (90 Base) MCG/ACT inhaler Inhale 2 puffs into the lungs every 6 (six) hours as needed for wheezing or shortness of breath.     aspirin EC 81 MG tablet Take 1 tablet (81 mg total) by mouth daily. Swallow whole. 90 tablet 3   buPROPion (WELLBUTRIN XL) 300 MG 24 hr tablet Take 1 tablet (300 mg total) by mouth daily. 30 tablet 2   doxycycline (VIBRAMYCIN) 50 MG capsule Take 1 capsule (50 mg total) by mouth 2 (two) times daily. 60 capsule 2   DULoxetine  (CYMBALTA) 30 MG capsule Take 1 capsule (30 mg total) by mouth 2 (two) times daily. 60 capsule 2   famotidine (PEPCID) 40 MG tablet Take 1 tablet (40 mg total) by mouth daily. 90 tablet 1   fluticasone (FLONASE) 50 MCG/ACT nasal spray USE 2 SPRAYS INTO BOTH NOSTRILS DAILY ASNEEDED FOR ALLERGIES OR RHINITIS (Patient taking differently: Place 2 sprays into both nostrils as needed for allergies or rhinitis.) 16 g 1   lisinopril-hydrochlorothiazide (ZESTORETIC) 10-12.5 MG tablet Take 0.5 tablets by mouth daily. 45 tablet 3   meloxicam (MOBIC) 7.5 MG tablet Take 7.5 mg by mouth daily. As needed.     metoprolol tartrate (LOPRESSOR) 25 MG tablet Take 1 tablet (25 mg total) by mouth 2 (two) times daily. 180 tablet 3   metroNIDAZOLE (METROCREAM) 0.75 % cream Apply topically 2 (two) times daily. (Patient taking differently: Apply 1 application  topically 2 (two) times daily.) 45 g 5   omeprazole (PRILOSEC) 40 MG capsule Take 1 capsule (40 mg total) by mouth daily. 30 capsule 2   atorvastatin (LIPITOR) 10 MG tablet Take 1 tablet (10 mg total) by mouth daily. 90 tablet 3   No current facility-administered medications on file prior to visit.   Past Medical History:  Diagnosis Date   Abnormal stress test 11/10/2019   Anxiety    Asthma    Atypical chest pain 09/15/2019   Chest tightness 11/26/2019   Depression    Gait abnormality 11/03/2019  HSV infection    1 & 2    Hypercholesterolemia 09/11/2018   Hypertension    Left facial numbness 10/25/2019   Mixed hyperlipidemia 11/03/2019   Need for prophylactic vaccination and inoculation against influenza 03/01/2020   Need for tetanus, diphtheria, and acellular pertussis (Tdap) vaccine 03/01/2020   Obstructive sleep apnea 09/11/2018   Palpitations 09/15/2019   Paresthesia 11/03/2019   Swelling of both lower extremities 09/11/2018   Past Surgical History:  Procedure Laterality Date   BREAST BIOPSY Left    CHOLECYSTECTOMY     COLONOSCOPY     TUBAL LIGATION       Family History  Problem Relation Age of Onset   Hyperlipidemia Mother    Hypertension Mother    Depression Mother    Hyperlipidemia Father    Skin cancer Father    Hypertension Brother    Diabetes Brother    Myelodysplastic syndrome Brother    Heart attack Maternal Grandmother    Heart disease Maternal Grandmother    Heart attack Maternal Grandfather    Dementia Paternal Grandmother    Colon cancer Paternal Grandfather    Hypertension Brother    Hyperlipidemia Brother    Sleep apnea Brother    Diabetes Brother    Diabetes Son    Heart failure Sister    Atrial fibrillation Sister    Allergic rhinitis Neg Hx    Angioedema Neg Hx    Asthma Neg Hx    Atopy Neg Hx    Eczema Neg Hx    Immunodeficiency Neg Hx    Urticaria Neg Hx    Social History   Socioeconomic History   Marital status: Divorced    Spouse name: Not on file   Number of children: 2   Years of education: Not on file   Highest education level: Not on file  Occupational History   Not on file  Tobacco Use   Smoking status: Former    Types: Cigarettes    Quit date: 2019    Years since quitting: 4.5   Smokeless tobacco: Never   Tobacco comments:    on and off for 20 years   Vaping Use   Vaping Use: Former  Substance and Sexual Activity   Alcohol use: Yes    Alcohol/week: 1.0 - 2.0 standard drink of alcohol    Types: 1 - 2 Standard drinks or equivalent per week   Drug use: Yes    Types: Marijuana    Comment: at night for insomnia sometimes    Sexual activity: Yes  Other Topics Concern   Not on file  Social History Narrative   Lives alone    Right handed   Caffeine: 1 cup/day   Social Determinants of Health   Financial Resource Strain: Not on file  Food Insecurity: Not on file  Transportation Needs: Not on file  Physical Activity: Not on file  Stress: Not on file  Social Connections: Not on file   CONSTITUTIONAL: see HPI E/N/T: Negative for ear pain, nasal congestion and sore throat.   CARDIOVASCULAR: see HPI RESPIRATORY: Negative for recent cough and dyspnea.  GASTROINTESTINAL: Negative for abdominal pain, acid reflux symptoms, constipation, diarrhea, nausea and vomiting.  MSK: see HPI INTEGUMENTARY: Negative for rash.  NEUROLOGICAL: Negative for dizziness and headaches.  PSYCHIATRIC: see HPI      Objective:  PHYSICAL EXAM:   VS: BP 110/70 (BP Location: Left Arm, Patient Position: Sitting, Cuff Size: Large)   Pulse 66   Temp (!) 97.4 F (  36.3 C) (Temporal)   Ht '5\' 5"'  (1.651 m)   Wt 174 lb 3.2 oz (79 kg)   SpO2 96%   BMI 28.99 kg/m   GEN: Well nourished, well developed, in no acute distress   Cardiac: RRR; no murmurs, rubs, or gallops,no edema -  Respiratory:  normal respiratory rate and pattern with no distress - normal breath sounds with no rales, rhonchi, wheezes or rubs  MS: no deformity or atrophy  Skin: warm and dry, no rash   Psych: euthymic mood, appropriate affect and demeanor  EKG - bradycardia noted - otherwise stable Lab Results  Component Value Date   WBC 5.9 03/23/2021   HGB 14.5 03/23/2021   HCT 41.9 03/23/2021   PLT 189 03/23/2021   GLUCOSE 86 03/23/2021   CHOL 147 03/23/2021   TRIG 57 03/23/2021   HDL 67 03/23/2021   LDLCALC 68 03/23/2021   ALT 25 03/23/2021   AST 31 03/23/2021   NA 144 03/23/2021   K 4.3 03/23/2021   CL 104 03/23/2021   CREATININE 0.88 03/23/2021   BUN 16 03/23/2021   CO2 25 03/23/2021   TSH 0.877 03/23/2021      Assessment & Plan:   Problem List Items Addressed This Visit       Cardiovascular and Mediastinum   Hypertension - Primary   Relevant Orders   CBC with Differential/Platelet   Comprehensive metabolic panel   TSH Continue meds     Other   Hypercholesterolemia Continue current meds      Atypical chest pain Continue follow up with cardiology            Mixed hyperlipidemia   Relevant Orders   Lipid panel Continue meds   Depression Continue meds   Other Visit Diagnoses                 .  No orders of the defined types were placed in this encounter.   Orders Placed This Encounter  Procedures   CBC with Differential/Platelet   Comprehensive metabolic panel   TSH   Lipid panel   VITAMIN D 25 Hydroxy (Vit-D Deficiency, Fractures)   EKG 12-Lead     Follow-up: Return in about 6 months (around 03/18/2022) for chronic fasting follow up.  An After Visit Summary was printed and given to the patient. AFTER PT APPT I RECEIVED CARDIOLOGY NOTE THAT STATES SHE HAS AN APPT SCHEDULED WITH A SPINE SPECIALIST IN [place] - WE CONTACTED PATIENT AND SHE SAYS THAT NO SHE DOES NOT HAVE AN APPT YET BUT WILL PURSUE THAT ROUTE IF SHE DOES NOT GET ANY ANSWERS OTHERWISE --- She had not mentioned any of that information at this visit and commented she brought 'all the papers with her' to discuss her myriad of symptoms (as mentioned above multiple, numerous pages of symptoms, google printouts and algorithyms)  I spoke with patient and once again explained that I am trying to get answers for her despite the multiple in depth neurological evaluations that she has had already (which is why she got the labwork done today as well as a repeat Cspine MRI) ---  Yetta Flock [facility] [phone]

## 2021-09-16 LAB — CBC WITH DIFFERENTIAL/PLATELET
Basophils Absolute: 0.1 10*3/uL (ref 0.0–0.2)
Basos: 1 %
EOS (ABSOLUTE): 0.1 10*3/uL (ref 0.0–0.4)
Eos: 2 %
Hematocrit: 37.9 % (ref 34.0–46.6)
Hemoglobin: 12.7 g/dL (ref 11.1–15.9)
Immature Grans (Abs): 0 10*3/uL (ref 0.0–0.1)
Immature Granulocytes: 0 %
Lymphocytes Absolute: 1.9 10*3/uL (ref 0.7–3.1)
Lymphs: 36 %
MCH: 30.8 pg (ref 26.6–33.0)
MCHC: 33.5 g/dL (ref 31.5–35.7)
MCV: 92 fL (ref 79–97)
Monocytes Absolute: 0.3 10*3/uL (ref 0.1–0.9)
Monocytes: 6 %
Neutrophils Absolute: 2.8 10*3/uL (ref 1.4–7.0)
Neutrophils: 55 %
Platelets: 173 10*3/uL (ref 150–450)
RBC: 4.13 x10E6/uL (ref 3.77–5.28)
RDW: 12.6 % (ref 11.7–15.4)
WBC: 5.1 10*3/uL (ref 3.4–10.8)

## 2021-09-16 LAB — COMPREHENSIVE METABOLIC PANEL
ALT: 19 IU/L (ref 0–32)
AST: 24 IU/L (ref 0–40)
Albumin/Globulin Ratio: 1.8 (ref 1.2–2.2)
Albumin: 4.1 g/dL (ref 3.8–4.9)
Alkaline Phosphatase: 56 IU/L (ref 44–121)
BUN/Creatinine Ratio: 18 (ref 9–23)
BUN: 16 mg/dL (ref 6–24)
Bilirubin Total: 0.4 mg/dL (ref 0.0–1.2)
CO2: 23 mmol/L (ref 20–29)
Calcium: 9.2 mg/dL (ref 8.7–10.2)
Chloride: 110 mmol/L — ABNORMAL HIGH (ref 96–106)
Creatinine, Ser: 0.89 mg/dL (ref 0.57–1.00)
Globulin, Total: 2.3 g/dL (ref 1.5–4.5)
Glucose: 79 mg/dL (ref 70–99)
Potassium: 4.2 mmol/L (ref 3.5–5.2)
Sodium: 146 mmol/L — ABNORMAL HIGH (ref 134–144)
Total Protein: 6.4 g/dL (ref 6.0–8.5)
eGFR: 75 mL/min/{1.73_m2} (ref >59)

## 2021-09-16 LAB — CARDIOVASCULAR RISK ASSESSMENT

## 2021-09-16 LAB — LIPID PANEL
Chol/HDL Ratio: 2.4 ratio (ref 0.0–4.4)
Cholesterol, Total: 133 mg/dL (ref 100–199)
HDL: 55 mg/dL (ref >39)
LDL Chol Calc (NIH): 66 mg/dL (ref 0–99)
Triglycerides: 53 mg/dL (ref 0–149)
VLDL Cholesterol Cal: 12 mg/dL (ref 5–40)

## 2021-09-16 LAB — VITAMIN D 25 HYDROXY (VIT D DEFICIENCY, FRACTURES): Vit D, 25-Hydroxy: 42.4 ng/mL (ref 30.0–100.0)

## 2021-09-16 LAB — TSH: TSH: 0.888 u[IU]/mL (ref 0.450–4.500)

## 2021-09-20 ENCOUNTER — Telehealth: Payer: Self-pay

## 2021-09-20 NOTE — Telephone Encounter (Signed)
Patient called and states she saw an oral surgeon and they told her she has tonsil stones which she can't see. She states she did her research and found that tonsil stones causes dysphagia which she was diagnosed with. She does not know what to do and would like your recommendation.   Patient states she is still having allergy issues. She does want to know if she needs to be seen?  Please advice. Thank you

## 2021-09-21 NOTE — Telephone Encounter (Signed)
Patient was informed of Dr. Bruna Potter message.   Patient has seen neurology already.

## 2021-09-25 ENCOUNTER — Encounter: Payer: Self-pay | Admitting: Cardiology

## 2021-09-25 ENCOUNTER — Encounter: Payer: Self-pay | Admitting: Physician Assistant

## 2021-10-03 ENCOUNTER — Other Ambulatory Visit: Payer: Self-pay

## 2021-10-03 MED ORDER — LISINOPRIL-HYDROCHLOROTHIAZIDE 10-12.5 MG PO TABS: 0.5000 | ORAL_TABLET | Freq: Every day | ORAL | 3 refills | Status: DC

## 2021-11-03 ENCOUNTER — Other Ambulatory Visit: Payer: Self-pay | Admitting: Physician Assistant

## 2021-11-03 DIAGNOSIS — Z1231 Encounter for screening mammogram for malignant neoplasm of breast: Secondary | ICD-10-CM

## 2021-11-29 ENCOUNTER — Ambulatory Visit
Admission: RE | Admit: 2021-11-29 | Discharge: 2021-11-29 | Disposition: A | Discharge status: Home / Self Care | Payer: 59 | Source: Ambulatory Visit | Attending: Physician Assistant | Admitting: Physician Assistant

## 2021-11-29 DIAGNOSIS — Z1231 Encounter for screening mammogram for malignant neoplasm of breast: Secondary | ICD-10-CM

## 2021-11-30 ENCOUNTER — Encounter: Payer: Self-pay | Admitting: Allergy and Immunology

## 2021-11-30 ENCOUNTER — Ambulatory Visit: Payer: 59 | Admitting: Allergy and Immunology

## 2021-11-30 VITALS — BP 94/60 | HR 70 | Temp 98.0°F | Resp 20

## 2021-11-30 DIAGNOSIS — J4531 Mild persistent asthma with (acute) exacerbation: Secondary | ICD-10-CM | POA: Diagnosis not present

## 2021-11-30 DIAGNOSIS — K219 Gastro-esophageal reflux disease without esophagitis: Secondary | ICD-10-CM

## 2021-11-30 DIAGNOSIS — K224 Dyskinesia of esophagus: Secondary | ICD-10-CM

## 2021-11-30 DIAGNOSIS — J3089 Other allergic rhinitis: Secondary | ICD-10-CM | POA: Diagnosis not present

## 2021-11-30 DIAGNOSIS — L719 Rosacea, unspecified: Secondary | ICD-10-CM | POA: Diagnosis not present

## 2021-11-30 MED ORDER — OMEPRAZOLE 40 MG PO CPDR: 40.0000 mg | DELAYED_RELEASE_CAPSULE | Freq: Every morning | ORAL | 1 refills | Status: DC

## 2021-11-30 MED ORDER — DOXYCYCLINE HYCLATE 50 MG PO CAPS: 50.0000 mg | ORAL_CAPSULE | Freq: Every day | ORAL | 1 refills | Status: DC

## 2021-11-30 MED ORDER — SUCRALFATE 1 G PO TABS: ORAL_TABLET | ORAL | 1 refills | Status: DC

## 2021-11-30 NOTE — Progress Notes (Signed)
[facility]   Follow-up Note  Referring Provider: Marge Duncans, PA-C Primary Provider: Marge Duncans, Hershal Coria Date of Office Visit: 11/30/2021  Subjective:   Yolanda Sanchez (DOB: 1961-01-12) is a 61 y.o. female who returns to the [facility] [place] on 11/30/2021 in re-evaluation of the following:  HPI: Yolanda Sanchez returns to this clinic in reevaluation of asthma, allergic rhinitis, LPR, esophageal dysmotility, rosacea.  I last saw her in this clinic 25 May 2021.  Her airway has been doing very well.  However, she apparently contracted a viral respiratory tract infection addressed at urgent care last week and was treated with amoxicillin.  Most of her nasal symptoms have resolved but she still has a little bit of lingering cough.  She did need to use her short acting bronchodilator during that timeframe.  Prior to that event she really did well and rarely used the short acting bronchodilator.  She really had very little problems with her nose although she did have some occasional nasal congestion.  She had rhinoscopy performed by ENT and apparently everything checked out okay.  She believes that her reflux is okay but, she still has a chest pain syndrome in her sternal area intermittently and she still has slow transit of food through her esophagus that can sometimes feel as though it gets hung up for 10 seconds or so.  This occurs even though she has been using a proton pump inhibitor and H2 receptor blocker.  She believes that her rosacea is under very good control at this point in time.  Allergies as of 11/30/2021       Reactions   Ranexa [ranolazine]    10-20-19        Medication List    albuterol 108 (90 Base) MCG/ACT inhaler Commonly known as: VENTOLIN HFA Inhale 2 puffs into the lungs every 6 (six) hours as needed for wheezing or shortness of breath.   amoxicillin 875 MG tablet Commonly known as: AMOXIL Take 875 mg  by mouth 2 (two) times daily.   aspirin EC 81 MG tablet Take 1 tablet (81 mg total) by mouth daily. Swallow whole.   atorvastatin 10 MG tablet Commonly known as: LIPITOR Take 1 tablet (10 mg total) by mouth daily.   buPROPion 300 MG 24 hr tablet Commonly known as: WELLBUTRIN XL Take 1 tablet (300 mg total) by mouth daily.   doxycycline 50 MG capsule Commonly known as: VIBRAMYCIN Take 1 capsule (50 mg total) by mouth 2 (two) times daily.   DULoxetine 30 MG capsule Commonly known as: CYMBALTA Take 1 capsule (30 mg total) by mouth 2 (two) times daily.   famotidine 40 MG tablet Commonly known as: PEPCID Take 1 tablet (40 mg total) by mouth daily.   fluticasone 50 MCG/ACT nasal spray Commonly known as: FLONASE USE 2 SPRAYS INTO BOTH NOSTRILS DAILY ASNEEDED FOR ALLERGIES OR RHINITIS   lisinopril-hydrochlorothiazide 10-12.5 MG tablet Commonly known as: ZESTORETIC Take 0.5 tablets by mouth daily.   meloxicam 7.5 MG tablet Commonly known as: MOBIC Take 7.5 mg by mouth daily. As needed.   metoprolol tartrate 25 MG tablet Commonly known as: LOPRESSOR Take 1 tablet (25 mg total) by mouth 2 (two) times daily.   metroNIDAZOLE 0.75 % cream Commonly known as: METROCREAM Apply topically 2 (two) times daily. What changed: how much to take   omeprazole 40 MG capsule Commonly known as: PRILOSEC Take 1 capsule (40 mg total) by mouth daily.  Past Medical History:  Diagnosis Date   Abnormal stress test 11/10/2019   Anxiety    Asthma    Atypical chest pain 09/15/2019   Chest tightness 11/26/2019   Depression    Dysphagia    Gait abnormality 11/03/2019   Hiatal hernia    HSV infection    1 & 2    Hypercholesterolemia 09/11/2018   Hypertension    Left facial numbness 10/25/2019   Mixed hyperlipidemia 11/03/2019   Need for prophylactic vaccination and inoculation against influenza 03/01/2020   Need for tetanus, diphtheria, and acellular pertussis (Tdap) vaccine 03/01/2020    Obstructive sleep apnea 09/11/2018   Palpitations 09/15/2019   Paresthesia 11/03/2019   Swelling of both lower extremities 09/11/2018    Past Surgical History:  Procedure Laterality Date   BREAST BIOPSY Left    CHOLECYSTECTOMY     COLONOSCOPY     DG BARIUM SWALLOW ([place] [facility]X)  07/20/2021   TUBAL LIGATION      Review of systems negative except as noted in HPI / PMHx or noted below:  Review of Systems  Constitutional: Negative.   HENT: Negative.    Eyes: Negative.   Respiratory: Negative.    Cardiovascular: Negative.   Gastrointestinal: Negative.   Genitourinary: Negative.   Musculoskeletal: Negative.   Skin: Negative.   Neurological: Negative.   Endo/Heme/Allergies: Negative.   Psychiatric/Behavioral: Negative.       Objective:   Vitals:   11/30/21 1602  BP: 94/60  Pulse: 70  Resp: 20  Temp: 98 F (36.7 C)  SpO2: 98%          Physical Exam Constitutional:      Appearance: She is not diaphoretic.  HENT:     Head: Normocephalic.     Right Ear: Tympanic membrane, ear canal and external ear normal.     Left Ear: Tympanic membrane, ear canal and external ear normal.     Nose: Nose normal. No mucosal edema or rhinorrhea.     Mouth/Throat:     Pharynx: Uvula midline. No oropharyngeal exudate.  Eyes:     Conjunctiva/sclera: Conjunctivae normal.  Neck:     Thyroid: No thyromegaly.     Trachea: Trachea normal. No tracheal tenderness or tracheal deviation.  Cardiovascular:     Rate and Rhythm: Normal rate and regular rhythm.     Heart sounds: Normal heart sounds, S1 normal and S2 normal. No murmur heard. Pulmonary:     Effort: No respiratory distress.     Breath sounds: No stridor. Wheezing (End expiratory wheezes posterior lung field) present. No rales.  Lymphadenopathy:     Head:     Right side of head: No tonsillar adenopathy.     Left side of head: No tonsillar adenopathy.     Cervical: No cervical adenopathy.  Skin:    Findings: No erythema or rash.      Nails: There is no clubbing.  Neurological:     Mental Status: She is alert.     Diagnostics:    Spirometry was performed and demonstrated an FEV1 of 1.96 at 101 % of predicted.  Results of a barium swallow obtained 20 Jul 2021 identified the following:  1.  Mild esophageal dysmotility with intraesophageal reflux.  2.  Small hiatal hernia.  3.  No gastroesophageal reflux observed.  Assessment and Plan:   1. Asthma, not well controlled, mild persistent, with acute exacerbation   2. Perennial allergic rhinitis   3. LPRD (laryngopharyngeal reflux disease)   4. Rosacea  5. Esophageal dysmotility    1. Continue to Treat and prevent inflammation:   A. Flonase - 1 spray each nostril 1-2 times per day  B. Prednisone 10 mg - 1 tablet 1 time per day for 10 days only  2. Treat and prevent reflux / LPR / chest pain:   A. Minimize all forms of caffeiene  B. Omeprazole 40 mg - 1 tablet in AM  C. Famotidine 40 mg - 1 tablet in PM   D. Carafate 1 gm - 1 tablet with mid day meal and evening meal  E. Metoclopramide???  3. If needed:   A. Albuterol HFA - 2 inhalations every 4-6 hours  4. Treat and prevent rosacea:   A.  Metrocream applied to face 2 times per day  B.  Doxycycline 50 mg - 1 time per day   5. Obtain fall flu vaccine  6. Return to clinic in 6 months or earlier if problem  Yolanda Sanchez appears to have an acute flare of her mild asthma because of her recent viral respiratory tract infection and I am going to give her a short course of low-dose steroids to help her through this issue.  She is having chest pain in the context of esophageal dysmotility and reflux and will optimize treatment for reflux as noted above.  I think she would be best served by using metoclopramide but I have some hesitation about using this medication at this point especially given the fact that she is using duloxetine.  I have asked her to contact me in a few weeks noting her response to this  approach regarding her reflux and chest pain.  She will continue on anti-inflammatory agents for her airway as well and continue to address the issue with reflux as noted above.  I will see her back in this clinic in 6 months or earlier if there is a problem.  Allena Katz, MD [facility]

## 2021-11-30 NOTE — Patient Instructions (Addendum)
  1. Continue to Treat and prevent inflammation:   A. Flonase - 1 spray each nostril 1-2 times per day  B. Prednisone 10 mg - 1 tablet 1 time per day for 10 days only  2. Treat and prevent reflux / LPR / chest pain:   A. Minimize all forms of caffeiene  B. Omeprazole 40 mg - 1 tablet in AM  C. Famotidine 40 mg - 1 tablet in PM   D. Carafate 1 gm - 1 tablet with mid day meal and evening meal  E. Metoclopramide???  3. If needed:   A. Albuterol HFA - 2 inhalations every 4-6 hours  4. Treat and prevent rosacea:   A.  Metrocream applied to face 2 times per day  B.  Doxycycline 50 mg - 1 time per day   5. Obtain fall flu vaccine  6. Return to clinic in 6 months or earlier if problem

## 2021-12-03 ENCOUNTER — Encounter: Payer: Self-pay | Admitting: Allergy and Immunology

## 2021-12-05 ENCOUNTER — Ambulatory Visit: Payer: 59 | Attending: Cardiology | Admitting: Cardiology

## 2021-12-05 ENCOUNTER — Encounter: Payer: Self-pay | Admitting: Cardiology

## 2021-12-05 VITALS — BP 120/82 | HR 73 | Ht 65.0 in | Wt 172.0 lb

## 2021-12-05 DIAGNOSIS — I10 Essential (primary) hypertension: Secondary | ICD-10-CM | POA: Diagnosis not present

## 2021-12-05 DIAGNOSIS — R0789 Other chest pain: Secondary | ICD-10-CM | POA: Diagnosis not present

## 2021-12-05 DIAGNOSIS — R200 Anesthesia of skin: Secondary | ICD-10-CM | POA: Diagnosis not present

## 2021-12-05 DIAGNOSIS — E7800 Pure hypercholesterolemia, unspecified: Secondary | ICD-10-CM | POA: Diagnosis not present

## 2021-12-05 NOTE — Progress Notes (Unsigned)
Cardiology Office Note:    Date:  12/05/2021   ID:  Yolanda Sanchez, DOB 31-Mar-1960, MRN [id number]  PCP:  Marge Duncans, PA-C  Cardiologist:  Jenne Campus, MD    Referring MD: Marge Duncans, PA-C   Chief Complaint  Patient presents with   Chest Pain    History of Present Illness:    Yolanda Sanchez is a 61 y.o. female with past medical history significant for atypical chest pain so far CAD work-up negative she did have coronary CT angio which was absolutely perfectly normal with calcium score 0, she was find to have hemodynamically insignificant stenosis in coronary arteries that being followed, no sustained ventricular tachycardia on the monitor but echocardiogram showed preserved ejection fraction no evidence of coronary artery disease she is asymptomatic from arrhythmia point of view.  Comes today to my office still described to have pain in her chest which is on and off not related to exercise account of heaviness.  Usually happen at rest.  Past Medical History:  Diagnosis Date   Abnormal stress test 11/10/2019   Anxiety    Asthma    Atypical chest pain 09/15/2019   Chest tightness 11/26/2019   Depression    Dysphagia    Gait abnormality 11/03/2019   Hiatal hernia    HSV infection    1 & 2    Hypercholesterolemia 09/11/2018   Hypertension    Left facial numbness 10/25/2019   Mixed hyperlipidemia 11/03/2019   Need for prophylactic vaccination and inoculation against influenza 03/01/2020   Need for tetanus, diphtheria, and acellular pertussis (Tdap) vaccine 03/01/2020   Obstructive sleep apnea 09/11/2018   Palpitations 09/15/2019   Paresthesia 11/03/2019   Swelling of both lower extremities 09/11/2018    Past Surgical History:  Procedure Laterality Date   BREAST BIOPSY Left    CHOLECYSTECTOMY     COLONOSCOPY     DG BARIUM SWALLOW ([place] [facility] HX)  07/20/2021   TUBAL LIGATION      Current Medications: Current Meds  Medication Sig   albuterol (VENTOLIN  HFA) 108 (90 Base) MCG/ACT inhaler Inhale 2 puffs into the lungs every 6 (six) hours as needed for wheezing or shortness of breath.   amoxicillin (AMOXIL) 875 MG tablet Take 875 mg by mouth 2 (two) times daily.   aspirin EC 81 MG tablet Take 1 tablet (81 mg total) by mouth daily. Swallow whole.   atorvastatin (LIPITOR) 10 MG tablet Take 1 tablet (10 mg total) by mouth daily.   buPROPion (WELLBUTRIN XL) 300 MG 24 hr tablet Take 1 tablet (300 mg total) by mouth daily.   doxycycline (VIBRAMYCIN) 50 MG capsule Take 1 capsule (50 mg total) by mouth daily.   DULoxetine (CYMBALTA) 30 MG capsule Take 1 capsule (30 mg total) by mouth 2 (two) times daily.   famotidine (PEPCID) 40 MG tablet Take 1 tablet (40 mg total) by mouth daily.   fluticasone (FLONASE) 50 MCG/ACT nasal spray USE 2 SPRAYS INTO BOTH NOSTRILS DAILY ASNEEDED FOR ALLERGIES OR RHINITIS (Patient taking differently: Place 2 sprays into both nostrils as needed for allergies or rhinitis.)   lisinopril-hydrochlorothiazide (ZESTORETIC) 10-12.5 MG tablet Take 0.5 tablets by mouth daily.   meloxicam (MOBIC) 7.5 MG tablet Take 7.5 mg by mouth daily. As needed.   metoprolol tartrate (LOPRESSOR) 25 MG tablet Take 1 tablet (25 mg total) by mouth 2 (two) times daily.   metroNIDAZOLE (METROCREAM) 0.75 % cream Apply topically 2 (two) times daily. (Patient taking differently: Apply 1 application  topically  2 (two) times daily.)   omeprazole (PRILOSEC) 40 MG capsule Take 1 capsule (40 mg total) by mouth every morning.   predniSONE (DELTASONE) 10 MG tablet Take 10 mg by mouth daily with breakfast.   sucralfate (CARAFATE) 1 g tablet 1 tablet with mid day meal and evening meal (Patient taking differently: Take 1 g by mouth See admin instructions. 1 tablet with mid day meal and evening meal)     Allergies:   Ranexa [ranolazine]   Social History   Socioeconomic History   Marital status: Divorced    Spouse name: Not on file   Number of children: 2   Years of  education: Not on file   Highest education level: Not on file  Occupational History   Not on file  Tobacco Use   Smoking status: Former    Types: Cigarettes    Quit date: 2019    Years since quitting: 4.7   Smokeless tobacco: Never   Tobacco comments:    on and off for 20 years   Vaping Use   Vaping Use: Former  Substance and Sexual Activity   Alcohol use: Yes    Alcohol/week: 1.0 - 2.0 standard drink of alcohol    Types: 1 - 2 Standard drinks or equivalent per week   Drug use: Yes    Types: Marijuana    Comment: at night for insomnia sometimes    Sexual activity: Yes  Other Topics Concern   Not on file  Social History Narrative   Lives alone    Right handed   Caffeine: 1 cup/day   Social Determinants of Health   Financial Resource Strain: Not on file  Food Insecurity: Not on file  Transportation Needs: Not on file  Physical Activity: Not on file  Stress: Not on file  Social Connections: Not on file     Family History: The patient's family history includes Atrial fibrillation in her sister; Colon cancer in her paternal grandfather; Dementia in her paternal grandmother; Depression in her mother; Diabetes in her brother, brother, and son; Heart attack in her maternal grandfather and maternal grandmother; Heart disease in her maternal grandmother; Heart failure in her sister; Hyperlipidemia in her brother, father, and mother; Hypertension in her brother, brother, and mother; Myelodysplastic syndrome in her brother; Skin cancer in her father; Sleep apnea in her brother. There is no history of Allergic rhinitis, Angioedema, Asthma, Atopy, Eczema, Immunodeficiency, or Urticaria. ROS:   Please see the history of present illness.    All 14 point review of systems negative except as described per history of present illness  EKGs/Labs/Other Studies Reviewed:      Recent Labs: 02/14/2021: Magnesium 2.3 09/15/2021: ALT 19; BUN 16; Creatinine, Ser 0.89; Hemoglobin 12.7; Platelets  173; Potassium 4.2; Sodium 146; TSH 0.888  Recent Lipid Panel    Component Value Date/Time   CHOL 133 09/15/2021 1122   TRIG 53 09/15/2021 1122   HDL 55 09/15/2021 1122   CHOLHDL 2.4 09/15/2021 1122   LDLCALC 66 09/15/2021 1122    Physical Exam:    VS:  BP 120/82 (BP Location: Left Arm, Patient Position: Sitting)   Pulse 73   Ht '5\' 5"'$  (1.651 m)   Wt 172 lb (78 kg)   SpO2 97%   BMI 28.62 kg/m     Wt Readings from Last 3 Encounters:  12/05/21 172 lb (78 kg)  09/15/21 174 lb 3.2 oz (79 kg)  08/23/21 172 lb 9.6 oz (78.3 kg)     GEN:  Well nourished, well developed in no acute distress HEENT: Normal NECK: No JVD; No carotid bruits LYMPHATICS: No lymphadenopathy CARDIAC: RRR, no murmurs, no rubs, no gallops RESPIRATORY:  Clear to auscultation without rales, wheezing or rhonchi  ABDOMEN: Soft, non-tender, non-distended MUSCULOSKELETAL:  No edema; No deformity  SKIN: Warm and dry LOWER EXTREMITIES: no swelling NEUROLOGIC:  Alert and oriented x 3 PSYCHIATRIC:  Normal affect   ASSESSMENT:    1. Primary hypertension   2. Chest tightness   3. Hypercholesterolemia   4. Left facial numbness    PLAN:    In order of problems listed above:  Essential hypertension blood pressure well controlled continue present management. Dyslipidemia I did review K PN which show LDL 66 HDL 55.  She is on Lipitor 10 mg which I will continue. Left facial numbness.  She did see neurology but very dissatisfied and she would like to get second opinion.  We will refer her to [facility] for evaluation. Chest tightness atypical or cardiac work-up negative.   Medication Adjustments/Labs and Tests Ordered: Current medicines are reviewed at length with the patient today.  Concerns regarding medicines are outlined above.  No orders of the defined types were placed in this encounter.  Medication changes: No orders of the defined types were placed in this encounter.   Signed, Park Liter, MD,  [facility] 12/05/2021 4:45 PM    [facility]

## 2021-12-05 NOTE — Patient Instructions (Addendum)
Medication Instructions:  Your physician recommends that you continue on your current medications as directed. Please refer to the Current Medication list given to you today.  *If you need a refill on your cardiac medications before your next appointment, please call your pharmacy*   Lab Work: None Ordered If you have labs (blood work) drawn today and your tests are completely normal, you will receive your results only by: [place] (if you have MyChart) OR A paper copy in the mail If you have any lab test that is abnormal or we need to change your treatment, we will call you to review the results.   Testing/Procedures: Your physician has requested that you have a carotid duplex. This test is an ultrasound of the carotid arteries in your neck. It looks at blood flow through these arteries that supply the brain with blood. Allow one hour for this exam. There are no restrictions or special instructions.    Follow-Up: At [facility], you and your health needs are our priority.  As part of our continuing mission to provide you with exceptional heart care, we have created designated Provider Care Teams.  These Care Teams include your primary Cardiologist (physician) and Advanced Practice Providers (APPs -  Physician Assistants and Nurse Practitioners) who all work together to provide you with the care you need, when you need it.  We recommend signing up for the patient portal called "MyChart".  Sign up information is provided on this After Visit Summary.  MyChart is used to connect with patients for Virtual Visits (Telemedicine).  Patients are able to view lab/test results, encounter notes, upcoming appointments, etc.  Non-urgent messages can be sent to your provider as well.   To learn more about what you can do with MyChart, go to [email or web address].    Your next appointment:   6 month(s)  The format for your next appointment:   In Person  Provider:   Jenne Campus, MD     Other Instructions Referral to [facility]- Will send information needed for referral and they will call for appt.

## 2021-12-07 ENCOUNTER — Other Ambulatory Visit: Payer: Self-pay

## 2021-12-07 MED ORDER — BUPROPION HCL ER (XL) 300 MG PO TB24: 300.0000 mg | ORAL_TABLET | Freq: Every day | ORAL | 2 refills | Status: DC

## 2021-12-12 ENCOUNTER — Telehealth: Payer: Self-pay | Admitting: Cardiology

## 2021-12-12 NOTE — Telephone Encounter (Signed)
Patient called to follow-up on her referral to [facility].

## 2021-12-12 NOTE — Telephone Encounter (Signed)
Spoke with pt about referral, advised that we was waiting for her to get her [place] completed to send referral . She agreed and verbalized understanding.

## 2021-12-13 ENCOUNTER — Other Ambulatory Visit: Payer: Self-pay

## 2021-12-13 MED ORDER — ATORVASTATIN CALCIUM 10 MG PO TABS: 10.0000 mg | ORAL_TABLET | Freq: Every day | ORAL | 3 refills | Status: DC

## 2021-12-20 ENCOUNTER — Encounter: Payer: Self-pay | Admitting: Cardiology

## 2021-12-20 ENCOUNTER — Ambulatory Visit: Payer: 59 | Attending: Cardiology

## 2021-12-20 DIAGNOSIS — R079 Chest pain, unspecified: Secondary | ICD-10-CM

## 2021-12-20 DIAGNOSIS — R200 Anesthesia of skin: Secondary | ICD-10-CM

## 2021-12-28 ENCOUNTER — Telehealth: Payer: Self-pay

## 2021-12-28 NOTE — Telephone Encounter (Signed)
Results reviewed with pt as per Dr. Krasowski's note.  Pt verbalized understanding and had no additional questions. Routed to PCP  

## 2022-01-11 ENCOUNTER — Encounter: Payer: Self-pay | Admitting: Cardiology

## 2022-01-30 LAB — HM PAP SMEAR

## 2022-01-30 LAB — RESULTS CONSOLE HPV: CHL HPV: NEGATIVE

## 2022-02-05 ENCOUNTER — Other Ambulatory Visit: Payer: Self-pay | Admitting: Physician Assistant

## 2022-02-05 MED ORDER — FAMOTIDINE 40 MG PO TABS: 40.0000 mg | ORAL_TABLET | Freq: Every day | ORAL | 1 refills | Status: DC

## 2022-02-23 ENCOUNTER — Encounter: Payer: Self-pay | Admitting: Physician Assistant

## 2022-02-28 ENCOUNTER — Other Ambulatory Visit: Payer: Self-pay

## 2022-02-28 MED ORDER — BUPROPION HCL ER (XL) 300 MG PO TB24: 300.0000 mg | ORAL_TABLET | Freq: Every day | ORAL | 2 refills | Status: DC

## 2022-03-20 ENCOUNTER — Other Ambulatory Visit: Payer: Self-pay | Admitting: *Deleted

## 2022-03-20 MED ORDER — DOXYCYCLINE HYCLATE 50 MG PO CAPS: 50.0000 mg | ORAL_CAPSULE | Freq: Every day | ORAL | 0 refills | Status: DC

## 2022-03-23 ENCOUNTER — Encounter: Payer: Self-pay | Admitting: Physician Assistant

## 2022-03-23 ENCOUNTER — Ambulatory Visit: Payer: 59 | Admitting: Physician Assistant

## 2022-03-23 VITALS — BP 100/68 | HR 57 | Temp 97.8°F | Ht 65.0 in | Wt 191.0 lb

## 2022-03-23 DIAGNOSIS — I10 Essential (primary) hypertension: Secondary | ICD-10-CM

## 2022-03-23 DIAGNOSIS — R5382 Chronic fatigue, unspecified: Secondary | ICD-10-CM | POA: Diagnosis not present

## 2022-03-23 DIAGNOSIS — F3289 Other specified depressive episodes: Secondary | ICD-10-CM | POA: Diagnosis not present

## 2022-03-23 DIAGNOSIS — E782 Mixed hyperlipidemia: Secondary | ICD-10-CM

## 2022-03-23 DIAGNOSIS — Z23 Encounter for immunization: Secondary | ICD-10-CM

## 2022-03-23 NOTE — Progress Notes (Signed)
Subjective:  Patient ID: Yolanda Sanchez, female    DOB: 1960/05/11  Age: 62 y.o. MRN: [id number]  Chief Complaint  Patient presents with   Hyperlipidemia   Hypertension    Hypertension  Hyperlipidemia    Pt presents for follow up of hypertension. The patient is tolerating the medication well without side effects. Compliance with treatment has been good; including taking medication as directed , maintains a healthy diet and regular exercise regimen , and following up as directed. She is currently on zestoretic 10/12.'5mg'$  qd -and is also now on lopressor '25mg'$  qd  Pt does see cardiology regularly - Dr Agustin Cree - she states that she has history of SVT and that she has nitro she uses as needed as well for chest pains - currently stable on lopressor 25  Mixed hyperlipidemia  Pt presents with hyperlipidemia.  The patient is compliant with medications, maintains a low cholesterol diet , follows up as directed , and maintains an exercise regimen . The patient denies experiencing any hypercholesterolemia related symptoms. She is currently on lipitor '10mg'$  qd  Pt with history or anxiety/depression -- she is stable on her current medications of cymbalta and wellbutrin --  Pt follows with Dr Neldon Mc regularly for allergies - no symptoms at this time  Pt with history of GERD - symptoms stable on prilosec '40mg'$  - she also takes carafate  Pt would like flu vaccine     Current Outpatient Medications on File Prior to Visit  Medication Sig Dispense Refill   albuterol (VENTOLIN HFA) 108 (90 Base) MCG/ACT inhaler Inhale 2 puffs into the lungs every 6 (six) hours as needed for wheezing or shortness of breath.     aspirin EC 81 MG tablet Take 1 tablet (81 mg total) by mouth daily. Swallow whole. 90 tablet 3   atorvastatin (LIPITOR) 10 MG tablet Take 1 tablet (10 mg total) by mouth daily. 90 tablet 3   buPROPion (WELLBUTRIN XL) 300 MG 24 hr tablet Take 1 tablet (300 mg total) by mouth daily. 30  tablet 2   doxycycline (VIBRAMYCIN) 50 MG capsule Take 1 capsule (50 mg total) by mouth daily. 90 capsule 0   DULoxetine (CYMBALTA) 30 MG capsule Take 1 capsule (30 mg total) by mouth 2 (two) times daily. 60 capsule 2   famotidine (PEPCID) 40 MG tablet Take 1 tablet (40 mg total) by mouth daily. 90 tablet 1   fluticasone (FLONASE) 50 MCG/ACT nasal spray USE 2 SPRAYS INTO BOTH NOSTRILS DAILY ASNEEDED FOR ALLERGIES OR RHINITIS (Patient taking differently: Place 2 sprays into both nostrils as needed for allergies or rhinitis.) 16 g 1   lisinopril-hydrochlorothiazide (ZESTORETIC) 10-12.5 MG tablet Take 0.5 tablets by mouth daily. 45 tablet 3   meloxicam (MOBIC) 7.5 MG tablet Take 7.5 mg by mouth daily. As needed.     metoprolol tartrate (LOPRESSOR) 25 MG tablet Take 1 tablet (25 mg total) by mouth 2 (two) times daily. 180 tablet 3   metroNIDAZOLE (METROCREAM) 0.75 % cream Apply topically 2 (two) times daily. (Patient taking differently: Apply 1 application  topically 2 (two) times daily.) 45 g 5   omeprazole (PRILOSEC) 40 MG capsule Take 1 capsule (40 mg total) by mouth every morning. 90 capsule 1   sucralfate (CARAFATE) 1 g tablet 1 tablet with mid day meal and evening meal (Patient taking differently: Take 1 g by mouth See admin instructions. 1 tablet with mid day meal and evening meal) 180 tablet 1   terbinafine (LAMISIL) 250 MG tablet  Take 1 tablet by mouth daily.     tretinoin (RETIN-A) 0.1 % cream      No current facility-administered medications on file prior to visit.   Past Medical History:  Diagnosis Date   Abnormal stress test 11/10/2019   Anxiety    Asthma    Atypical chest pain 09/15/2019   Chest tightness 11/26/2019   Depression    Dysphagia    Gait abnormality 11/03/2019   Hiatal hernia    HSV infection    1 & 2    Hypercholesterolemia 09/11/2018   Hypertension    Left facial numbness 10/25/2019   Mixed hyperlipidemia 11/03/2019   Need for prophylactic vaccination and  inoculation against influenza 03/01/2020   Need for tetanus, diphtheria, and acellular pertussis (Tdap) vaccine 03/01/2020   Obstructive sleep apnea 09/11/2018   Palpitations 09/15/2019   Paresthesia 11/03/2019   Swelling of both lower extremities 09/11/2018   Past Surgical History:  Procedure Laterality Date   BREAST BIOPSY Left    CHOLECYSTECTOMY     COLONOSCOPY     DG BARIUM SWALLOW ([place] HX)  07/20/2021   TUBAL LIGATION      Family History  Problem Relation Age of Onset   Hyperlipidemia Mother    Hypertension Mother    Depression Mother    Hyperlipidemia Father    Skin cancer Father    Hypertension Brother    Diabetes Brother    Myelodysplastic syndrome Brother    Heart attack Maternal Grandmother    Heart disease Maternal Grandmother    Heart attack Maternal Grandfather    Dementia Paternal Grandmother    Colon cancer Paternal Grandfather    Hypertension Brother    Hyperlipidemia Brother    Sleep apnea Brother    Diabetes Brother    Diabetes Son    Heart failure Sister    Atrial fibrillation Sister    Allergic rhinitis Neg Hx    Angioedema Neg Hx    Asthma Neg Hx    Atopy Neg Hx    Eczema Neg Hx    Immunodeficiency Neg Hx    Urticaria Neg Hx    Social History   Socioeconomic History   Marital status: Divorced    Spouse name: Not on file   Number of children: 2   Years of education: Not on file   Highest education level: Not on file  Occupational History   Not on file  Tobacco Use   Smoking status: Former    Types: Cigarettes    Quit date: 2019    Years since quitting: 5.0   Smokeless tobacco: Never   Tobacco comments:    on and off for 20 years   Vaping Use   Vaping Use: Former  Substance and Sexual Activity   Alcohol use: Yes    Alcohol/week: 1.0 - 2.0 standard drink of alcohol    Types: 1 - 2 Standard drinks or equivalent per week   Drug use: Yes    Types: Marijuana    Comment: at night for insomnia sometimes    Sexual activity: Yes   Other Topics Concern   Not on file  Social History Narrative   Lives alone    Right handed   Caffeine: 1 cup/day   Social Determinants of Health   Financial Resource Strain: Not on file  Food Insecurity: Not on file  Transportation Needs: Not on file  Physical Activity: Not on file  Stress: Not on file  Social Connections: Not on file  CONSTITUTIONAL: Negative for chills, fatigue, fever, unintentional weight gain and unintentional weight loss.  E/N/T: Negative for ear pain, nasal congestion and sore throat.  CARDIOVASCULAR: Negative for chest pain, dizziness, palpitations and pedal edema.  RESPIRATORY: Negative for recent cough and dyspnea.  GASTROINTESTINAL: pt states she has had some bloating and feeling like she is not voiding stool well - is up to date on colonoscopy MSK: Negative for arthralgias and myalgias.  INTEGUMENTARY: Negative for rash.  NEUROLOGICAL: Negative for dizziness and headaches.  PSYCHIATRIC: Negative for sleep disturbance and to question depression screen.  Negative for depression, negative for anhedonia.       Objective:  PHYSICAL EXAM:   VS: BP 100/68 (BP Location: Left Arm, Patient Position: Sitting)   Pulse (!) 57   Temp 97.8 F (36.6 C) (Temporal)   Ht '5\' 5"'$  (1.651 m)   Wt 191 lb (86.6 kg)   SpO2 99%   BMI 31.78 kg/m   GEN: Well nourished, well developed, in no acute distress   Cardiac: RRR; no murmurs, rubs, or gallops,no edema -  Respiratory:  normal respiratory rate and pattern with no distress - normal breath sounds with no rales, rhonchi, wheezes or rubs GI: normal bowel sounds, no masses or tenderness MS: no deformity or atrophy  Skin: warm and dry, no rash  Psych: euthymic mood, appropriate affect and demeanor   Lab Results  Component Value Date   WBC 5.1 09/15/2021   HGB 12.7 09/15/2021   HCT 37.9 09/15/2021   PLT 173 09/15/2021   GLUCOSE 79 09/15/2021   CHOL 133 09/15/2021   TRIG 53 09/15/2021   HDL 55 09/15/2021    LDLCALC 66 09/15/2021   ALT 19 09/15/2021   AST 24 09/15/2021   NA 146 (H) 09/15/2021   K 4.2 09/15/2021   CL 110 (H) 09/15/2021   CREATININE 0.89 09/15/2021   BUN 16 09/15/2021   CO2 23 09/15/2021   TSH 0.888 09/15/2021      Assessment & Plan:   Problem List Items Addressed This Visit       Cardiovascular and Mediastinum   Hypertension - Primary   Relevant Orders   CBC with Differential/Platelet   Comprehensive metabolic panel   TSH Continue meds     Other   Hypercholesterolemia Continue current meds Low cholesterol diet                  Mixed hyperlipidemia   Relevant Orders   Lipid panel Continue meds Low cholesterol diet   Depression Continue meds  Need for flu vaccine Flucelvax given  GERD  Continue current meds Also increase water and take daily fiber supplement   Other Visit Diagnoses                .  No orders of the defined types were placed in this encounter.   Orders Placed This Encounter  Procedures   Flu Vaccine MDCK QUAD PF   Comprehensive metabolic panel   TSH   Lipid panel   VITAMIN D 25 Hydroxy (Vit-D Deficiency, Fractures)   CBC with Differential/Platelet     Follow-up: Return in about 6 months (around 09/21/2022) for chronic fasting follow up.   Yetta Flock [facility] [phone]

## 2022-03-24 LAB — COMPREHENSIVE METABOLIC PANEL
ALT: 26 IU/L (ref 0–32)
AST: 26 IU/L (ref 0–40)
Albumin/Globulin Ratio: 1.8 (ref 1.2–2.2)
Albumin: 4.1 g/dL (ref 3.8–4.9)
Alkaline Phosphatase: 65 IU/L (ref 44–121)
BUN/Creatinine Ratio: 15 (ref 9–23)
BUN: 13 mg/dL (ref 6–24)
Bilirubin Total: 0.3 mg/dL (ref 0.0–1.2)
CO2: 25 mmol/L (ref 20–29)
Calcium: 9.2 mg/dL (ref 8.7–10.2)
Chloride: 104 mmol/L (ref 96–106)
Creatinine, Ser: 0.84 mg/dL (ref 0.57–1.00)
Globulin, Total: 2.3 g/dL (ref 1.5–4.5)
Glucose: 83 mg/dL (ref 70–99)
Potassium: 4.1 mmol/L (ref 3.5–5.2)
Sodium: 142 mmol/L (ref 134–144)
Total Protein: 6.4 g/dL (ref 6.0–8.5)
eGFR: 80 mL/min/{1.73_m2} (ref >59)

## 2022-03-24 LAB — CBC WITH DIFFERENTIAL/PLATELET
Basophils Absolute: 0 10*3/uL (ref 0.0–0.2)
Basos: 1 %
EOS (ABSOLUTE): 0.1 10*3/uL (ref 0.0–0.4)
Eos: 1 %
Hematocrit: 39.3 % (ref 34.0–46.6)
Hemoglobin: 13.2 g/dL (ref 11.1–15.9)
Immature Grans (Abs): 0 10*3/uL (ref 0.0–0.1)
Immature Granulocytes: 0 %
Lymphocytes Absolute: 1.7 10*3/uL (ref 0.7–3.1)
Lymphs: 33 %
MCH: 31.3 pg (ref 26.6–33.0)
MCHC: 33.6 g/dL (ref 31.5–35.7)
MCV: 93 fL (ref 79–97)
Monocytes Absolute: 0.3 10*3/uL (ref 0.1–0.9)
Monocytes: 6 %
Neutrophils Absolute: 3.1 10*3/uL (ref 1.4–7.0)
Neutrophils: 59 %
Platelets: 201 10*3/uL (ref 150–450)
RBC: 4.22 x10E6/uL (ref 3.77–5.28)
RDW: 13.2 % (ref 11.7–15.4)
WBC: 5.2 10*3/uL (ref 3.4–10.8)

## 2022-03-24 LAB — LIPID PANEL
Chol/HDL Ratio: 2.1 ratio (ref 0.0–4.4)
Cholesterol, Total: 142 mg/dL (ref 100–199)
HDL: 68 mg/dL (ref >39)
LDL Chol Calc (NIH): 64 mg/dL (ref 0–99)
Triglycerides: 44 mg/dL (ref 0–149)
VLDL Cholesterol Cal: 10 mg/dL (ref 5–40)

## 2022-03-24 LAB — CARDIOVASCULAR RISK ASSESSMENT

## 2022-03-24 LAB — VITAMIN D 25 HYDROXY (VIT D DEFICIENCY, FRACTURES): Vit D, 25-Hydroxy: 43.7 ng/mL (ref 30.0–100.0)

## 2022-03-24 LAB — TSH: TSH: 1.47 u[IU]/mL (ref 0.450–4.500)

## 2022-03-31 ENCOUNTER — Other Ambulatory Visit: Payer: Self-pay | Admitting: Allergy and Immunology

## 2022-04-02 ENCOUNTER — Encounter: Payer: Self-pay | Admitting: Physician Assistant

## 2022-04-16 ENCOUNTER — Encounter: Payer: Self-pay | Admitting: Physician Assistant

## 2022-04-16 NOTE — Telephone Encounter (Signed)
Don't see any particular medication that would cause weight gain - and she has been on chronically Recommend increase exercise, low fat diet

## 2022-05-09 ENCOUNTER — Other Ambulatory Visit: Payer: Self-pay | Admitting: Allergy and Immunology

## 2022-05-17 ENCOUNTER — Other Ambulatory Visit: Payer: Self-pay | Admitting: Cardiology

## 2022-05-29 ENCOUNTER — Other Ambulatory Visit: Payer: Self-pay

## 2022-05-30 ENCOUNTER — Other Ambulatory Visit: Payer: Self-pay | Admitting: Physician Assistant

## 2022-05-31 ENCOUNTER — Ambulatory Visit: Payer: 59 | Admitting: Allergy and Immunology

## 2022-06-05 ENCOUNTER — Ambulatory Visit: Payer: 59 | Attending: Cardiology | Admitting: Cardiology

## 2022-06-05 ENCOUNTER — Encounter: Payer: Self-pay | Admitting: Cardiology

## 2022-06-05 VITALS — BP 112/72 | HR 54 | Ht 65.0 in | Wt 198.2 lb

## 2022-06-05 DIAGNOSIS — E782 Mixed hyperlipidemia: Secondary | ICD-10-CM | POA: Diagnosis not present

## 2022-06-05 DIAGNOSIS — R200 Anesthesia of skin: Secondary | ICD-10-CM | POA: Diagnosis not present

## 2022-06-05 DIAGNOSIS — I10 Essential (primary) hypertension: Secondary | ICD-10-CM | POA: Diagnosis not present

## 2022-06-05 NOTE — Patient Instructions (Signed)

## 2022-06-05 NOTE — Progress Notes (Signed)
Cardiology Office Note:    Date:  06/05/2022   ID:  Yolanda, Sanchez November 25, 1960, MRN [id number]  PCP:  Patient, No Pcp Per  Cardiologist:  Jenne Campus, MD    Referring MD: Yolanda Duncans, PA-C   No chief complaint on file. Cardiac wise doing well  History of Present Illness:    Yolanda Sanchez is a 62 y.o. female with past medical history significant for atypical chest pain cardiac workup negative including coronary CT angio which showed no coronary arteries with calcium score 0, she also got some history of neurological issue being seen by neurologist nothing permanent has being identified.  Recently she ended up seeing [facility] and no conclusion out of this initial impression was that she may have some autoimmune neurological issue but then neurological workup/autoimmune workup has been negative. Comes today to my office doing quite well.  Biggest complaint is the fact that she is gaining weight and she is disappointed about it.  She is thinking about changing her primary care physician because she is dissatisfied with the guidelines and advised that she is getting.  Denies having any typical chest pain but still the same that she had before.  Past Medical History:  Diagnosis Date   Abnormal stress test 11/10/2019   Anxiety    Asthma    Atypical chest pain 09/15/2019   Chest tightness 11/26/2019   Depression    Dysphagia    Gait abnormality 11/03/2019   Hiatal hernia    HSV infection    1 & 2    Hypercholesterolemia 09/11/2018   Hypertension    Left facial numbness 10/25/2019   Left wrist pain 01/27/2018   Mixed hyperlipidemia 11/03/2019   Need for prophylactic vaccination and inoculation against influenza 03/01/2020   Need for tetanus, diphtheria, and acellular pertussis (Tdap) vaccine 03/01/2020   Obstructive sleep apnea 09/11/2018   Palpitations 09/15/2019   Paresthesia 11/03/2019   Swelling of both lower extremities 09/11/2018    Past  Surgical History:  Procedure Laterality Date   BREAST BIOPSY Left    CHOLECYSTECTOMY     COLONOSCOPY     DG BARIUM SWALLOW ([place] HX)  07/20/2021   TUBAL LIGATION      Current Medications: Current Meds  Medication Sig   albuterol (VENTOLIN HFA) 108 (90 Base) MCG/ACT inhaler Inhale 2 puffs into the lungs every 6 (six) hours as needed for wheezing or shortness of breath.   aspirin EC 81 MG tablet Take 1 tablet (81 mg total) by mouth daily. Swallow whole.   atorvastatin (LIPITOR) 10 MG tablet Take 1 tablet (10 mg total) by mouth daily.   buPROPion (WELLBUTRIN XL) 300 MG 24 hr tablet Take 1 tablet (300 mg total) by mouth daily.   doxycycline (VIBRAMYCIN) 50 MG capsule Take 1 capsule (50 mg total) by mouth daily.   famotidine (PEPCID) 40 MG tablet Take 1 tablet (40 mg total) by mouth daily.   fluticasone (FLONASE) 50 MCG/ACT nasal spray USE 2 SPRAYS INTO BOTH NOSTRILS DAILY ASNEEDED FOR ALLERGIES OR RHINITIS (Patient taking differently: Place 2 sprays into both nostrils as needed for allergies or rhinitis.)   lisinopril-hydrochlorothiazide (ZESTORETIC) 10-12.5 MG tablet Take 0.5 tablets by mouth daily.   meloxicam (MOBIC) 7.5 MG tablet Take 7.5 mg by mouth daily. As needed.   metoprolol tartrate (LOPRESSOR) 25 MG tablet Take 1 tablet (25 mg total) by mouth 2 (two) times daily.   metroNIDAZOLE (METROCREAM) 0.75 % cream Apply topically 2 (two) times daily. (Patient taking  differently: Apply 1 application  topically 2 (two) times daily.)   omeprazole (PRILOSEC) 40 MG capsule TAKE ONE CAPSULE BY MOUTH EVERY MORNING   sucralfate (CARAFATE) 1 g tablet TAKE 1 TABLET WITH MID DAY AND EVENING MEAL   terbinafine (LAMISIL) 250 MG tablet Take 1 tablet by mouth daily.   tretinoin (RETIN-A) 0.1 % cream      Allergies:   Metronidazole and Ranexa [ranolazine]   Social History   Socioeconomic History   Marital status: Divorced    Spouse name: Not on file   Number of children: 2   Years of education:  Not on file   Highest education level: Not on file  Occupational History   Not on file  Tobacco Use   Smoking status: Former    Types: Cigarettes    Quit date: 2019    Years since quitting: 5.2   Smokeless tobacco: Never   Tobacco comments:    on and off for 20 years   Vaping Use   Vaping Use: Former  Substance and Sexual Activity   Alcohol use: Yes    Alcohol/week: 1.0 - 2.0 standard drink of alcohol    Types: 1 - 2 Standard drinks or equivalent per week   Drug use: Yes    Types: Marijuana    Comment: at night for insomnia sometimes    Sexual activity: Yes  Other Topics Concern   Not on file  Social History Narrative   Lives alone    Right handed   Caffeine: 1 cup/day   Social Determinants of Health   Financial Resource Strain: Not on file  Food Insecurity: Not on file  Transportation Needs: Not on file  Physical Activity: Not on file  Stress: Not on file  Social Connections: Not on file     Family History: The patient's family history includes Atrial fibrillation in her sister; Colon cancer in her paternal grandfather; Dementia in her paternal grandmother; Depression in her mother; Diabetes in her brother, brother, and son; Heart attack in her maternal grandfather and maternal grandmother; Heart disease in her maternal grandmother; Heart failure in her sister; Hyperlipidemia in her brother, father, and mother; Hypertension in her brother, brother, and mother; Myelodysplastic syndrome in her brother; Skin cancer in her father; Sleep apnea in her brother. There is no history of Allergic rhinitis, Angioedema, Asthma, Atopy, Eczema, Immunodeficiency, or Urticaria. ROS:   Please see the history of present illness.    All 14 point review of systems negative except as described per history of present illness  EKGs/Labs/Other Studies Reviewed:      Recent Labs: 03/23/2022: ALT 26; BUN 13; Creatinine, Ser 0.84; Hemoglobin 13.2; Platelets 201; Potassium 4.1; Sodium 142; TSH  1.470  Recent Lipid Panel    Component Value Date/Time   CHOL 142 03/23/2022 0846   TRIG 44 03/23/2022 0846   HDL 68 03/23/2022 0846   CHOLHDL 2.1 03/23/2022 0846   LDLCALC 64 03/23/2022 0846    Physical Exam:    VS:  BP 112/72 (BP Location: Left Arm, Patient Position: Sitting)   Pulse (!) 54   Ht 5\' 5"  (1.651 m)   Wt 198 lb 3.2 oz (89.9 kg)   SpO2 96%   BMI 32.98 kg/m     Wt Readings from Last 3 Encounters:  06/05/22 198 lb 3.2 oz (89.9 kg)  03/23/22 191 lb (86.6 kg)  12/05/21 172 lb (78 kg)     GEN:  Well nourished, well developed in no acute distress HEENT: Normal  NECK: No JVD; No carotid bruits LYMPHATICS: No lymphadenopathy CARDIAC: RRR, no murmurs, no rubs, no gallops RESPIRATORY:  Clear to auscultation without rales, wheezing or rhonchi  ABDOMEN: Soft, non-tender, non-distended MUSCULOSKELETAL:  No edema; No deformity  SKIN: Warm and dry LOWER EXTREMITIES: no swelling NEUROLOGIC:  Alert and oriented x 3 PSYCHIATRIC:  Normal affect   ASSESSMENT:    1. Primary hypertension   2. Mixed hyperlipidemia   3. Left facial numbness    PLAN:    In order of problems listed above:  Essential hypertension blood pressure well-controlled continue present management. Mixed dyslipidemia I did review K PN which show me data from March 23, 2022 LDL 64/68 good cholesterol control continue present management. History of left facial numbness but no neurological issue has been identified so far. Weight gain.  She is working hard and felt to lose weight hopefully she will be able to accomplish the goal   Medication Adjustments/Labs and Tests Ordered: Current medicines are reviewed at length with the patient today.  Concerns regarding medicines are outlined above.  No orders of the defined types were placed in this encounter.  Medication changes: No orders of the defined types were placed in this encounter.   Signed, Park Liter, MD, [facility] 06/05/2022 9:31 AM     [place]

## 2022-06-10 ENCOUNTER — Encounter: Payer: Self-pay | Admitting: Physician Assistant

## 2022-06-10 ENCOUNTER — Encounter: Payer: Self-pay | Admitting: Allergy and Immunology

## 2022-06-10 ENCOUNTER — Other Ambulatory Visit: Payer: Self-pay | Admitting: Physician Assistant

## 2022-06-11 ENCOUNTER — Ambulatory Visit: Payer: 59 | Admitting: Allergy and Immunology

## 2022-06-11 ENCOUNTER — Encounter: Payer: Self-pay | Admitting: Allergy and Immunology

## 2022-06-11 VITALS — BP 116/62 | HR 56 | Resp 8

## 2022-06-11 DIAGNOSIS — K219 Gastro-esophageal reflux disease without esophagitis: Secondary | ICD-10-CM

## 2022-06-11 DIAGNOSIS — L719 Rosacea, unspecified: Secondary | ICD-10-CM | POA: Diagnosis not present

## 2022-06-11 DIAGNOSIS — J4520 Mild intermittent asthma, uncomplicated: Secondary | ICD-10-CM

## 2022-06-11 DIAGNOSIS — J3089 Other allergic rhinitis: Secondary | ICD-10-CM | POA: Diagnosis not present

## 2022-06-11 NOTE — Progress Notes (Unsigned)
[place] - [place] - [place]   Follow-up Note  Referring Provider: Marge Duncans, PA-C Primary Provider: Patient, No Pcp Per Date of Office Visit: 06/11/2022  Subjective:   Aissa Fernicola (DOB: [birth year]) is a 62 y.o. female who returns to the [facility] on 06/11/2022 in re-evaluation of the following:  HPI: Yamaris returns to this clinic in reevaluation of asthma, allergic rhinitis, LPR, esophageal dysmotility, rosacea.  I last saw her in this clinic 30 November 2021.  Overall she has done very well with her airway and has not required a systemic steroid or antibiotic for any type of airway issue while she uses some nasal steroids on occasion.  Rarely does she use any albuterol and she can exert herself without any problem.  Her facial inflammatory skin condition called rosacea is doing very well at this point in time.  She will occasionally use the MetroCream.  She has been using doxycycline but she is discontinue this agent because she has been having some problems with her stomach and she is working through this issue with her chiropractor.  Even though she uses a proton pump inhibitor and H2 receptor blocker and Carafate she still has problems with her abdomen S, a little bit worse recently.  She has lots of gas and bloating and discomfort and occasionally some burning.  She was seeing Dr. Earlean Shawl in the past but he has since retired.  She has received the flu vaccine and RSV vaccine.  Allergies as of 06/11/2022       Reactions   Metronidazole Other (See Comments)   Ranexa [ranolazine]    10-20-19        Medication List    albuterol 108 (90 Base) MCG/ACT inhaler Commonly known as: VENTOLIN HFA Inhale 2 puffs into the lungs every 6 (six) hours as needed for wheezing or shortness of breath.   aspirin EC 81 MG tablet Take 1 tablet (81 mg total) by mouth daily. Swallow whole.   atorvastatin 10 MG tablet Commonly known  as: LIPITOR Take 1 tablet (10 mg total) by mouth daily.   buPROPion 300 MG 24 hr tablet Commonly known as: WELLBUTRIN XL Take 1 tablet (300 mg total) by mouth daily.   doxycycline 50 MG capsule Commonly known as: VIBRAMYCIN Take 1 capsule (50 mg total) by mouth daily.   famotidine 40 MG tablet Commonly known as: PEPCID Take 1 tablet (40 mg total) by mouth daily.   fluticasone 50 MCG/ACT nasal spray Commonly known as: FLONASE USE 2 SPRAYS INTO BOTH NOSTRILS DAILY ASNEEDED FOR ALLERGIES OR RHINITIS   GARLIC PO Take by mouth.   L-Glutamine Powd by Does not apply route.   lisinopril-hydrochlorothiazide 10-12.5 MG tablet Commonly known as: ZESTORETIC Take 0.5 tablets by mouth daily.   MAG GLYCINATE PO Take by mouth.   meloxicam 7.5 MG tablet Commonly known as: MOBIC Take 7.5 mg by mouth daily. As needed.   metoprolol tartrate 25 MG tablet Commonly known as: LOPRESSOR Take 1 tablet (25 mg total) by mouth 2 (two) times daily.   metroNIDAZOLE 0.75 % cream Commonly known as: METROCREAM Apply topically 2 (two) times daily. What changed: how much to take   NUTRITIONAL SUPPLEMENT PO Take by mouth. Bactrex #3 supplement   NUTRITIONAL SUPPLEMENT PO Take by mouth. Poly-Prebiotic supplement   NUTRITIONAL SUPPLEMENT PO Take by mouth. Bilex Supplement   OIL OF OREGANO PO Take by mouth.   omeprazole 40 MG capsule Commonly known as: PRILOSEC  TAKE ONE CAPSULE BY MOUTH EVERY MORNING   PROBIOTIC PO Take by mouth. Custom Probiotic 11 strain   sucralfate 1 g tablet Commonly known as: CARAFATE TAKE 1 TABLET WITH MID DAY AND EVENING MEAL   tretinoin 0.1 % cream Commonly known as: RETIN-A   ZINC PO Take by mouth. Zinc Carnosine    Past Medical History:  Diagnosis Date   Abnormal stress test 11/10/2019   Anxiety    Asthma    Atypical chest pain 09/15/2019   Chest tightness 11/26/2019   Depression    Dysphagia    Gait abnormality 11/03/2019   Hiatal hernia     HSV infection    1 & 2    Hypercholesterolemia 09/11/2018   Hypertension    Left facial numbness 10/25/2019   Left wrist pain 01/27/2018   Mixed hyperlipidemia 11/03/2019   Need for prophylactic vaccination and inoculation against influenza 03/01/2020   Need for tetanus, diphtheria, and acellular pertussis (Tdap) vaccine 03/01/2020   Obstructive sleep apnea 09/11/2018   Palpitations 09/15/2019   Paresthesia 11/03/2019   Swelling of both lower extremities 09/11/2018    Past Surgical History:  Procedure Laterality Date   BREAST BIOPSY Left    CHOLECYSTECTOMY     COLONOSCOPY     DG BARIUM SWALLOW ([place] HX)  07/20/2021   TUBAL LIGATION      Review of systems negative except as noted in HPI / PMHx or noted below:  Review of Systems  Constitutional: Negative.   HENT: Negative.    Eyes: Negative.   Respiratory: Negative.    Cardiovascular: Negative.   Gastrointestinal: Negative.   Genitourinary: Negative.   Musculoskeletal: Negative.   Skin: Negative.   Neurological: Negative.   Endo/Heme/Allergies: Negative.   Psychiatric/Behavioral: Negative.       Objective:   Vitals:   06/11/22 0835  BP: 116/62  Pulse: (!) 56  Resp: (!) 8  SpO2: 97%          Physical Exam Constitutional:      Appearance: She is not diaphoretic.  HENT:     Head: Normocephalic.     Right Ear: Tympanic membrane, ear canal and external ear normal.     Left Ear: Tympanic membrane, ear canal and external ear normal.     Nose: Nose normal. No mucosal edema or rhinorrhea.     Mouth/Throat:     Pharynx: Uvula midline. No oropharyngeal exudate.  Eyes:     Conjunctiva/sclera: Conjunctivae normal.  Neck:     Thyroid: No thyromegaly.     Trachea: Trachea normal. No tracheal tenderness or tracheal deviation.  Cardiovascular:     Rate and Rhythm: Normal rate and regular rhythm.     Heart sounds: Normal heart sounds, S1 normal and S2 normal. No murmur heard. Pulmonary:     Effort: No respiratory  distress.     Breath sounds: Normal breath sounds. No stridor. No wheezing or rales.  Lymphadenopathy:     Head:     Right side of head: No tonsillar adenopathy.     Left side of head: No tonsillar adenopathy.     Cervical: No cervical adenopathy.  Skin:    Findings: No erythema or rash.     Nails: There is no clubbing.  Neurological:     Mental Status: She is alert.     Diagnostics: none  Assessment and Plan:   1. Asthma, mild intermittent, well-controlled   2. Perennial allergic rhinitis   3. LPRD (laryngopharyngeal reflux disease)  4. Rosacea    1. Continue to Treat and prevent inflammation:   A. Flonase - 1 spray each nostril 1-2 times per day  2. Treat and prevent reflux / LPR / chest pain:   A. Minimize all forms of caffeiene  B. Omeprazole 40 mg - 1 tablet in AM  C. Famotidine 40 mg - 1 tablet in PM   D. Carafate 1 gm - 2 times per day (does this help???)  3. If needed:   A. Albuterol HFA - 2 inhalations every 4-6 hours  4. Treat and prevent rosacea:   A.  Metrocream applied to face 2 times per day  B.  Doxycycline 50 mg - 1 time per day   5. Return to clinic in 6 months or earlier if problem  Zhana appears to be doing pretty well with her upper airway and her lower airway with the medical plan noted above and her rosacea appears to be doing pretty well.  What is not doing well as her abdomen and I have encouraged her to follow-up with a gastroenterologist regarding further management of this issue.  She did mention that she uses Carafate but she is not sure it really helps and she can just discontinue this agent and see if it does actually help.  I would recommend that she stay on an H1 receptor blocker and a proton pump inhibitor consistently as noted above.  I will see her back in this clinic in 6 months.  Allena Katz, MD Allergy / Immunology [place]

## 2022-06-11 NOTE — Patient Instructions (Addendum)
  1. Continue to Treat and prevent inflammation:   A. Flonase - 1 spray each nostril 1-2 times per day  2. Treat and prevent reflux / LPR / chest pain:   A. Minimize all forms of caffeiene  B. Omeprazole 40 mg - 1 tablet in AM  C. Famotidine 40 mg - 1 tablet in PM   D. Carafate 1 gm - 2 times per day (does this help???)  3. If needed:   A. Albuterol HFA - 2 inhalations every 4-6 hours  4. Treat and prevent rosacea:   A.  Metrocream applied to face 2 times per day  B.  Doxycycline 50 mg - 1 time per day   5. Return to clinic in 6 months or earlier if problem

## 2022-06-12 ENCOUNTER — Encounter: Payer: Self-pay | Admitting: Allergy and Immunology

## 2022-06-14 ENCOUNTER — Other Ambulatory Visit: Payer: Self-pay | Admitting: Physician Assistant

## 2022-06-15 ENCOUNTER — Other Ambulatory Visit: Payer: Self-pay | Admitting: Allergy and Immunology

## 2022-06-30 ENCOUNTER — Other Ambulatory Visit: Payer: Self-pay | Admitting: Allergy and Immunology

## 2022-07-31 ENCOUNTER — Other Ambulatory Visit: Payer: Self-pay | Admitting: Physician Assistant

## 2022-08-27 ENCOUNTER — Other Ambulatory Visit: Payer: Self-pay | Admitting: Cardiology

## 2022-09-25 ENCOUNTER — Ambulatory Visit: Payer: 59 | Admitting: Physician Assistant

## 2022-10-18 ENCOUNTER — Encounter: Payer: Self-pay | Admitting: Physician Assistant

## 2022-10-18 ENCOUNTER — Other Ambulatory Visit: Payer: Self-pay | Admitting: Allergy and Immunology

## 2022-10-18 ENCOUNTER — Other Ambulatory Visit: Payer: Self-pay | Admitting: Cardiology

## 2022-11-22 ENCOUNTER — Other Ambulatory Visit: Payer: Self-pay | Admitting: Cardiology

## 2022-11-23 ENCOUNTER — Other Ambulatory Visit: Payer: Self-pay | Admitting: Allergy and Immunology

## 2022-12-06 DIAGNOSIS — H5712 Ocular pain, left eye: Secondary | ICD-10-CM | POA: Insufficient documentation

## 2022-12-06 DIAGNOSIS — Z83518 Family history of other specified eye disorder: Secondary | ICD-10-CM | POA: Insufficient documentation

## 2022-12-06 DIAGNOSIS — H50112 Monocular exotropia, left eye: Secondary | ICD-10-CM | POA: Insufficient documentation

## 2022-12-06 DIAGNOSIS — H50012 Monocular esotropia, left eye: Secondary | ICD-10-CM | POA: Insufficient documentation

## 2022-12-12 ENCOUNTER — Encounter: Payer: Self-pay | Admitting: Allergy and Immunology

## 2022-12-12 ENCOUNTER — Ambulatory Visit: Payer: BC Managed Care – PPO | Admitting: Allergy and Immunology

## 2022-12-12 VITALS — BP 116/82 | HR 70 | Resp 16 | Ht 64.5 in | Wt 188.0 lb

## 2022-12-12 DIAGNOSIS — K219 Gastro-esophageal reflux disease without esophagitis: Secondary | ICD-10-CM

## 2022-12-12 DIAGNOSIS — J3089 Other allergic rhinitis: Secondary | ICD-10-CM | POA: Diagnosis not present

## 2022-12-12 DIAGNOSIS — L719 Rosacea, unspecified: Secondary | ICD-10-CM

## 2022-12-12 DIAGNOSIS — J4520 Mild intermittent asthma, uncomplicated: Secondary | ICD-10-CM | POA: Diagnosis not present

## 2022-12-12 MED ORDER — METRONIDAZOLE 0.75 % EX CREA: TOPICAL_CREAM | CUTANEOUS | 5 refills | Status: DC

## 2022-12-12 MED ORDER — AIRSUPRA 90-80 MCG/ACT IN AERO: 2.0000 | INHALATION_SPRAY | RESPIRATORY_TRACT | 1 refills | Status: DC | PRN

## 2022-12-12 NOTE — Progress Notes (Unsigned)
[facility] - [place] - [place] - [place] - [place]   Follow-up Note  Referring Provider: No ref. provider found Primary Provider: Patient, No Pcp Per Date of Office Visit: 12/12/2022  Subjective:   Yolanda Sanchez (DOB: [birth year]) is a 62 y.o. female who [facility] in re-evaluation of the following:  HPI: [name] returns to this clinic in evaluation of intermittent asthma, allergic rhinitis, LPR and esophageal dysmotility, and rosacea.  I last saw her in this clinic 11 June 2022.  She has had a very significant lifestyle change.  She is only eating meats and eggs and has eliminated all fruits and vegetables and grains and gluten.  She did this to address her GI issue with excellent result.  She has no problems with her abdomen at this point time and she has stopped all of her treatment for reflux.  She has had no problems with asthma and she has not used the short acting bronchodilator and she has not required a systemic steroid to treat an exacerbation.  She has no problems with her nose and does not use any nasal steroid and has not had to use a antibiotic to treat an episode of sinusitis.  She occasionally uses some MetroCream for her rosacea.  Allergies as of 12/12/2022       Reactions   Metronidazole Other (See Comments)   Ranexa [ranolazine]    10-20-19        Medication List    albuterol 108 (90 Base) MCG/ACT inhaler Commonly known as: VENTOLIN HFA Inhale 2 puffs into the lungs every 6 (six) hours as needed for wheezing or shortness of breath.   aspirin EC 81 MG tablet Take 1 tablet (81 mg total) by mouth daily. Swallow whole.   atorvastatin 10 MG tablet Commonly known as: LIPITOR TAKE ONE TABLET BY MOUTH DAILY   buPROPion 300 MG 24 hr tablet Commonly known as: WELLBUTRIN XL Take 1 tablet (300 mg total) by mouth daily.   doxycycline 50 MG capsule Commonly known as: VIBRAMYCIN Take 1 capsule (50  mg total) by mouth daily.   famotidine 40 MG tablet Commonly known as: PEPCID TAKE ONE TABLET BY MOUTH DAILY   fluticasone 50 MCG/ACT nasal spray Commonly known as: FLONASE USE 2 SPRAYS INTO BOTH NOSTRILS DAILY ASNEEDED FOR ALLERGIES OR RHINITIS   IODINE (KELP) PO Take by mouth.   lisinopril-hydrochlorothiazide 10-12.5 MG tablet Commonly known as: ZESTORETIC take ONE-HALF tablet by MOUTH every DAY   MAG GLYCINATE PO Take by mouth.   meloxicam 7.5 MG tablet Commonly known as: MOBIC Take 7.5 mg by mouth daily. As needed.   metoprolol tartrate 25 MG tablet Commonly known as: LOPRESSOR Take 1 tablet by mouth twice daily   metroNIDAZOLE 0.75 % cream Commonly known as: METROCREAM Apply topically 2 (two) times daily.   omeprazole 40 MG capsule Commonly known as: PRILOSEC TAKE ONE CAPSULE BY MOUTH EVERY MORNING   sucralfate 1 g tablet Commonly known as: CARAFATE TAKE 1 TABLET WITH MID DAY AND EVENING MEAL   tretinoin 0.1 % cream Commonly known as: RETIN-A    Past Medical History:  Diagnosis Date   Abnormal stress test 11/10/2019   Anxiety    Asthma    Atypical chest pain 09/15/2019   Chest tightness 11/26/2019   Depression    Dysphagia    Gait abnormality 11/03/2019   Hiatal hernia    HSV infection    1 & 2  Hypercholesterolemia 09/11/2018   Hypertension    Left facial numbness 10/25/2019   Left wrist pain 01/27/2018   Mixed hyperlipidemia 11/03/2019   Need for prophylactic vaccination and inoculation against influenza 03/01/2020   Need for tetanus, diphtheria, and acellular pertussis (Tdap) vaccine 03/01/2020   Obstructive sleep apnea 09/11/2018   Palpitations 09/15/2019   Paresthesia 11/03/2019   Swelling of both lower extremities 09/11/2018    Past Surgical History:  Procedure Laterality Date   BREAST BIOPSY Left    CHOLECYSTECTOMY     COLONOSCOPY     DG BARIUM SWALLOW ([facility] HX)  07/20/2021   TUBAL LIGATION      Review of systems negative  except as noted in HPI / PMHx or noted below:  Review of Systems  Constitutional: Negative.   HENT: Negative.    Eyes: Negative.   Respiratory: Negative.    Cardiovascular: Negative.   Gastrointestinal: Negative.   Genitourinary: Negative.   Musculoskeletal: Negative.   Skin: Negative.   Neurological: Negative.   Endo/Heme/Allergies: Negative.   Psychiatric/Behavioral: Negative.       Objective:   Vitals:   12/12/22 1629  BP: 116/82  Pulse: 70  Resp: 16  SpO2: 94%   Height: 5' 4.5" (163.8 cm)  Weight: 188 lb (85.3 kg)   Physical Exam Constitutional:      Appearance: She is not diaphoretic.  HENT:     Head: Normocephalic.     Right Ear: Tympanic membrane, ear canal and external ear normal.     Left Ear: Tympanic membrane, ear canal and external ear normal.     Nose: Nose normal. No mucosal edema or rhinorrhea.     Mouth/Throat:     Pharynx: Uvula midline. No oropharyngeal exudate.  Eyes:     Conjunctiva/sclera: Conjunctivae normal.  Neck:     Thyroid: No thyromegaly.     Trachea: Trachea normal. No tracheal tenderness or tracheal deviation.  Cardiovascular:     Rate and Rhythm: Normal rate and regular rhythm.     Heart sounds: Normal heart sounds, S1 normal and S2 normal. No murmur heard. Pulmonary:     Effort: No respiratory distress.     Breath sounds: Normal breath sounds. No stridor. No wheezing or rales.  Lymphadenopathy:     Head:     Right side of head: No tonsillar adenopathy.     Left side of head: No tonsillar adenopathy.     Cervical: No cervical adenopathy.  Skin:    Findings: No erythema or rash.     Nails: There is no clubbing.  Neurological:     Mental Status: She is alert.     Diagnostics: none  Assessment and Plan:   1. Asthma, mild intermittent, well-controlled   2. Perennial allergic rhinitis   3. LPRD (laryngopharyngeal reflux disease)   4. Rosacea    1. If needed:   A. AIRSUPRA - 2 inhalations every 4-6 hours (coupon) B.   Metrocream applied to face 2 times per day  2. Obtain fall flu vaccine  3. Return to clinic in 12 months or earlier if problem  [name] is doing very well and the bulk of her respiratory tract issues and her reflux issues are inactive and I have given her a anti-inflammatory rescue medicine to use if needed and she can also use some MetroCream to address her rosacea if it is needed.  Will see her back in the clinic in 1 year or earlier if there is a problem.  Laurette Schimke, MD  Allergy / Immunology [facility]

## 2022-12-12 NOTE — Patient Instructions (Signed)
  1. If needed:   A. AIRSUPRA - 2 inhalations every 4-6 hours (coupon) B.  Metrocream applied to face 2 times per day  2. Obtain fall flu vaccine  3. Return to clinic in 12 months or earlier if problem

## 2022-12-13 ENCOUNTER — Encounter: Payer: Self-pay | Admitting: Allergy and Immunology

## 2022-12-27 ENCOUNTER — Other Ambulatory Visit: Payer: Self-pay | Admitting: Allergy and Immunology

## 2023-01-01 ENCOUNTER — Encounter: Payer: Self-pay | Admitting: Cardiology

## 2023-01-01 ENCOUNTER — Ambulatory Visit: Payer: BC Managed Care – PPO | Attending: Cardiology | Admitting: Cardiology

## 2023-01-01 VITALS — BP 118/76 | HR 65 | Ht 64.5 in | Wt 186.2 lb

## 2023-01-01 DIAGNOSIS — R002 Palpitations: Secondary | ICD-10-CM

## 2023-01-01 DIAGNOSIS — I739 Peripheral vascular disease, unspecified: Secondary | ICD-10-CM | POA: Diagnosis not present

## 2023-01-01 DIAGNOSIS — I10 Essential (primary) hypertension: Secondary | ICD-10-CM

## 2023-01-01 DIAGNOSIS — E7800 Pure hypercholesterolemia, unspecified: Secondary | ICD-10-CM

## 2023-01-01 NOTE — Patient Instructions (Addendum)
Medication Instructions:  Your physician recommends that you continue on your current medications as directed. Please refer to the Current Medication list given to you today.  *If you need a refill on your cardiac medications before your next appointment, please call your pharmacy*   Lab Work: Your physician recommends that you return for lab work in: when fasting You need to have labs done when you are fasting.  You can come Monday through Friday 8:30 am to 12:00 pm and 1:15 to 4:30. You do not need to make an appointment as the order has already been placed. The labs you are going to have done are  Lipids.    Testing/Procedures: Your physician has requested that you have a carotid duplex. This test is an ultrasound of the carotid arteries in your neck. It looks at blood flow through these arteries that supply the brain with blood. Allow one hour for this exam. There are no restrictions or special instructions.    Follow-Up: At [facility], you and your health needs are our priority.  As part of our continuing mission to provide you with exceptional heart care, we have created designated Provider Care Teams.  These Care Teams include your primary Cardiologist (physician) and Advanced Practice Providers (APPs -  Physician Assistants and Nurse Practitioners) who all work together to provide you with the care you need, when you need it.  We recommend signing up for the patient portal called "MyChart".  Sign up information is provided on this After Visit Summary.  MyChart is used to connect with patients for Virtual Visits (Telemedicine).  Patients are able to view lab/test results, encounter notes, upcoming appointments, etc.  Non-urgent messages can be sent to your provider as well.   To learn more about what you can do with MyChart, go to [email or web address].    Your next appointment:   6 month(s)  The format for your next appointment:   In Person  Provider:   Gypsy Balsam, MD     Other Instructions NA

## 2023-01-01 NOTE — Progress Notes (Unsigned)
Cardiology Office Note:    Date:  01/01/2023   ID:  Yolanda Sanchez, DOB [birth year], MRN [id number]  PCP:  Alinda Deem, MD  Cardiologist:  Gypsy Balsam, MD    Referring MD: No ref. provider found   Chief Complaint  Patient presents with   Medication Management   Results    labs    History of Present Illness:    Yolanda Sanchez is a 62 y.o. female  with past medical history significant for atypical chest pain cardiac workup negative including coronary CT angio which showed no coronary arteries with calcium score 0, she also got some history of neurological issue being seen by neurologist nothing permanent has being identified. Recently she ended up seeing [facility] neurology and no conclusion out of this initial impression was that she may have some autoimmune neurological issue but then neurological workup/autoimmune workup has been negative.  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  Diet with gluten has being started and she is doing much better.  Denies have any chest pain tightness squeezing pressure burning chest overall doing well  Past Medical History:  Diagnosis Date   Abnormal stress test 11/10/2019   Anxiety    Asthma    Atypical chest pain 09/15/2019   Chest tightness 11/26/2019   Depression    Dysphagia    Gait abnormality 11/03/2019   Hiatal hernia    HSV infection    1 & 2    Hypercholesterolemia 09/11/2018   Hypertension    Left facial numbness 10/25/2019   Left wrist pain 01/27/2018   Mixed hyperlipidemia 11/03/2019   Need for prophylactic vaccination and inoculation against influenza 03/01/2020   Need for tetanus, diphtheria, and acellular pertussis (Tdap) vaccine 03/01/2020   Obstructive sleep apnea 09/11/2018   Palpitations 09/15/2019   Paresthesia 11/03/2019   Swelling of both lower extremities 09/11/2018    Past Surgical History:  Procedure Laterality Date   BREAST BIOPSY Left     CHOLECYSTECTOMY     COLONOSCOPY     DG BARIUM SWALLOW ([facility] HX)  07/20/2021   TUBAL LIGATION      Current Medications: Current Meds  Medication Sig   albuterol (VENTOLIN HFA) 108 (90 Base) MCG/ACT inhaler Inhale 2 puffs into the lungs every 6 (six) hours as needed for wheezing or shortness of breath.   Albuterol-Budesonide (AIRSUPRA) 90-80 MCG/ACT AERO Inhale 2 puffs into the lungs as needed (every four to six hours for cough, wheeze, shortness of breath.  Rinse, gargle, and spit after use).   aspirin EC 81 MG tablet Take 1 tablet (81 mg total) by mouth daily. Swallow whole.   atorvastatin (LIPITOR) 10 MG tablet TAKE ONE TABLET BY MOUTH DAILY   fluticasone (FLONASE) 50 MCG/ACT nasal spray USE 2 SPRAYS INTO BOTH NOSTRILS DAILY ASNEEDED FOR ALLERGIES OR RHINITIS (Patient taking differently: Place 2 sprays into both nostrils daily.)   IODINE, KELP, PO Take 1 tablet by mouth daily.   lisinopril-hydrochlorothiazide (ZESTORETIC) 10-12.5 MG tablet take ONE-HALF tablet by MOUTH every DAY   Magnesium Bisglycinate (MAG GLYCINATE PO) Take 1 tablet by mouth daily.   metoprolol tartrate (LOPRESSOR) 25 MG tablet Take 1 tablet by mouth twice daily   metroNIDAZOLE (METROCREAM) 0.75 % cream Can apply to face twice daily if needed. (Patient taking differently: Apply 1 Application topically 2 (two) times daily. Can apply to face twice daily if needed.)   tretinoin (RETIN-A) 0.1 % cream Apply 1 Application topically at bedtime.   [  DISCONTINUED] buPROPion (WELLBUTRIN XL) 300 MG 24 hr tablet Take 1 tablet (300 mg total) by mouth daily.   [DISCONTINUED] doxycycline (VIBRAMYCIN) 50 MG capsule Take 1 capsule (50 mg total) by mouth daily.   [DISCONTINUED] famotidine (PEPCID) 40 MG tablet TAKE ONE TABLET BY MOUTH DAILY   [DISCONTINUED] meloxicam (MOBIC) 7.5 MG tablet Take 7.5 mg by mouth daily. As needed.   [DISCONTINUED] omeprazole (PRILOSEC) 40 MG capsule TAKE ONE CAPSULE BY MOUTH EVERY MORNING   [DISCONTINUED]  sucralfate (CARAFATE) 1 g tablet TAKE 1 TABLET WITH MID DAY AND EVENING MEAL (Patient taking differently: Take 1 g by mouth 2 (two) times daily. TAKE 1 TABLET WITH MID DAY AND EVENING MEAL)     Allergies:   Metronidazole and Ranexa [ranolazine]   Social History   Socioeconomic History   Marital status: Divorced    Spouse name: Not on file   Number of children: 2   Years of education: Not on file   Highest education level: Not on file  Occupational History   Not on file  Tobacco Use   Smoking status: Former    Current packs/day: 0.00    Types: Cigarettes    Quit date: 2019    Years since quitting: 5.8   Smokeless tobacco: Never   Tobacco comments:    on and off for 20 years   Vaping Use   Vaping status: Former  Substance and Sexual Activity   Alcohol use: Yes    Alcohol/week: 1.0 - 2.0 standard drink of alcohol    Types: 1 - 2 Standard drinks or equivalent per week   Drug use: Yes    Types: Marijuana    Comment: at night for insomnia sometimes    Sexual activity: Yes  Other Topics Concern   Not on file  Social History Narrative   Lives alone    Right handed   Caffeine: 1 cup/day   Social Determinants of Health   Financial Resource Strain: Not on file  Food Insecurity: Not on file  Transportation Needs: Not on file  Physical Activity: Not on file  Stress: Not on file  Social Connections: Not on file     Family History: The patient's family history includes Atrial fibrillation in her sister; Colon cancer in her paternal grandfather; Dementia in her paternal grandmother; Depression in her mother; Diabetes in her brother, brother, and son; Heart attack in her maternal grandfather and maternal grandmother; Heart disease in her maternal grandmother; Heart failure in her sister; Hyperlipidemia in her brother, father, and mother; Hypertension in her brother, brother, and mother; Myelodysplastic syndrome in her brother; Skin cancer in her father; Sleep apnea in her brother.  There is no history of Allergic rhinitis, Angioedema, Asthma, Atopy, Eczema, Immunodeficiency, or Urticaria. ROS:   Please see the history of present illness.    All 14 point review of systems negative except as described per history of present illness  EKGs/Labs/Other Studies Reviewed:         Recent Labs: 03/23/2022: ALT 26; BUN 13; Creatinine, Ser 0.84; Hemoglobin 13.2; Platelets 201; Potassium 4.1; Sodium 142; TSH 1.470  Recent Lipid Panel    Component Value Date/Time   CHOL 142 03/23/2022 0846   TRIG 44 03/23/2022 0846   HDL 68 03/23/2022 0846   CHOLHDL 2.1 03/23/2022 0846   LDLCALC 64 03/23/2022 0846    Physical Exam:    VS:  BP 118/76 (BP Location: Left Arm, Patient Position: Sitting)   Pulse 65   Ht 5' 4.5" (1.638  m)   Wt 186 lb 3.2 oz (84.5 kg)   SpO2 95%   BMI 31.47 kg/m     Wt Readings from Last 3 Encounters:  01/01/23 186 lb 3.2 oz (84.5 kg)  12/12/22 188 lb (85.3 kg)  06/05/22 198 lb 3.2 oz (89.9 kg)     GEN:  Well nourished, well developed in no acute distress HEENT: Normal NECK: No JVD; No carotid bruits LYMPHATICS: No lymphadenopathy CARDIAC: RRR, no murmurs, no rubs, no gallops RESPIRATORY:  Clear to auscultation without rales, wheezing or rhonchi  ABDOMEN: Soft, non-tender, non-distended MUSCULOSKELETAL:  No edema; No deformity  SKIN: Warm and dry LOWER EXTREMITIES: no swelling NEUROLOGIC:  Alert and oriented x 3 PSYCHIATRIC:  Normal affect   ASSESSMENT:    1. Primary hypertension   2. Palpitations   3. 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 Hypercholesterolemia    PLAN:    In order of problems listed above:  Essential hypertension blood pressure well-controlled continue present management. Dyslipidemia I did review blood test from summer time LDL 71 HDL 55.  She decided to go on cardiovert diet and she would like to have her cholesterol checked.  However she thinks she wants to stop her  Lipitor.  She said she read so many bad things about this medication she does not want to take it anymore.  I told her that doubt very much that diet alone will be able to get her closer with supposed to be 2 also was rational for giving her medication which is carotic arterial disease.  She said she will think about it. Peripheral vascular disease carotic ultrasounds will be repeated   Medication Adjustments/Labs and Tests Ordered: Current medicines are reviewed at length with the patient today.  Concerns regarding medicines are outlined above.  Orders Placed This Encounter  Procedures   Lipid panel   EKG 12-Lead   Medication changes: No orders of the defined types were placed in this encounter.   Signed, Georgeanna Lea, MD, [facility] 01/01/2023 4:45 PM    [facility]

## 2023-01-11 HISTORY — PX: EYE SURGERY: SHX253

## 2023-01-15 ENCOUNTER — Encounter: Payer: Self-pay | Admitting: Allergy and Immunology

## 2023-02-05 ENCOUNTER — Other Ambulatory Visit: Payer: Self-pay | Admitting: Cardiology

## 2023-02-06 DIAGNOSIS — Z9889 Other specified postprocedural states: Secondary | ICD-10-CM | POA: Insufficient documentation

## 2023-02-11 ENCOUNTER — Ambulatory Visit: Payer: BC Managed Care – PPO | Attending: Cardiology

## 2023-02-11 DIAGNOSIS — I6523 Occlusion and stenosis of bilateral carotid arteries: Secondary | ICD-10-CM

## 2023-02-11 DIAGNOSIS — I739 Peripheral vascular disease, unspecified: Secondary | ICD-10-CM

## 2023-02-12 LAB — LIPID PANEL
Chol/HDL Ratio: 4 {ratio} (ref 0.0–4.4)
Cholesterol, Total: 226 mg/dL — ABNORMAL HIGH (ref 100–199)
HDL: 56 mg/dL (ref >39)
LDL Chol Calc (NIH): 156 mg/dL — ABNORMAL HIGH (ref 0–99)
Triglycerides: 81 mg/dL (ref 0–149)
VLDL Cholesterol Cal: 14 mg/dL (ref 5–40)

## 2023-02-19 ENCOUNTER — Telehealth: Payer: Self-pay

## 2023-02-19 NOTE — Telephone Encounter (Signed)
Lab Results reviewed with pt as per Dr. Vanetta Shawl note.  Pt verbalized understanding and had no additional questions. Routed to PCP

## 2023-02-19 NOTE — Telephone Encounter (Signed)
 [place] Results reviewed with pt as per Dr. Vanetta Shawl note.  Pt verbalized understanding and had no additional questions. Routed to PCP

## 2023-02-27 ENCOUNTER — Other Ambulatory Visit: Payer: Self-pay | Admitting: Obstetrics and Gynecology

## 2023-02-27 DIAGNOSIS — Z1231 Encounter for screening mammogram for malignant neoplasm of breast: Secondary | ICD-10-CM

## 2023-02-27 DIAGNOSIS — N952 Postmenopausal atrophic vaginitis: Secondary | ICD-10-CM | POA: Insufficient documentation

## 2023-03-18 DIAGNOSIS — G43001 Migraine without aura, not intractable, with status migrainosus: Secondary | ICD-10-CM | POA: Insufficient documentation

## 2023-03-22 ENCOUNTER — Ambulatory Visit
Admission: RE | Admit: 2023-03-22 | Discharge: 2023-03-22 | Disposition: A | Discharge status: Home / Self Care | Payer: BC Managed Care – PPO | Source: Ambulatory Visit | Attending: Obstetrics and Gynecology | Admitting: Obstetrics and Gynecology

## 2023-03-22 ENCOUNTER — Ambulatory Visit: Payer: BC Managed Care – PPO

## 2023-03-22 DIAGNOSIS — Z1231 Encounter for screening mammogram for malignant neoplasm of breast: Secondary | ICD-10-CM

## 2023-06-21 ENCOUNTER — Other Ambulatory Visit: Payer: Self-pay | Admitting: Obstetrics and Gynecology

## 2023-06-21 DIAGNOSIS — Z803 Family history of malignant neoplasm of breast: Secondary | ICD-10-CM

## 2023-08-19 ENCOUNTER — Encounter: Payer: Self-pay | Admitting: Obstetrics and Gynecology

## 2023-08-20 ENCOUNTER — Encounter: Payer: Self-pay | Admitting: Obstetrics and Gynecology

## 2023-08-23 ENCOUNTER — Ambulatory Visit
Admission: RE | Admit: 2023-08-23 | Discharge: 2023-08-23 | Disposition: A | Discharge status: Home / Self Care | Source: Ambulatory Visit | Attending: Obstetrics and Gynecology | Admitting: Obstetrics and Gynecology

## 2023-08-23 DIAGNOSIS — Z803 Family history of malignant neoplasm of breast: Secondary | ICD-10-CM

## 2023-08-23 MED ORDER — GADOPICLENOL 0.5 MMOL/ML IV SOLN
8.0000 mL | Freq: Once | INTRAVENOUS | Status: AC | PRN
  Administered 2023-08-23: 8 mL via INTRAVENOUS

## 2023-10-07 ENCOUNTER — Ambulatory Visit: Attending: Cardiology | Admitting: Cardiology

## 2023-10-07 ENCOUNTER — Encounter: Payer: Self-pay | Admitting: Cardiology

## 2023-10-07 VITALS — BP 112/68 | HR 62 | Ht 65.0 in | Wt 167.8 lb

## 2023-10-07 DIAGNOSIS — E7800 Pure hypercholesterolemia, unspecified: Secondary | ICD-10-CM

## 2023-10-07 DIAGNOSIS — I739 Peripheral vascular disease, unspecified: Secondary | ICD-10-CM

## 2023-10-07 DIAGNOSIS — R002 Palpitations: Secondary | ICD-10-CM

## 2023-10-07 DIAGNOSIS — I10 Essential (primary) hypertension: Secondary | ICD-10-CM | POA: Diagnosis not present

## 2023-10-07 NOTE — Progress Notes (Unsigned)
 Cardiology Office Note:    Date:  10/07/2023   ID:  Yolanda Sanchez, DOB [birth year], MRN [id number]  PCP:  Gayl Males, MD  Cardiologist:  Lamar Fitch, MD    Referring MD: Gayl Males, MD   Chief Complaint  Patient presents with   Follow-up    History of Present Illness:    Yolanda Sanchez is a 63 y.o. female past medical history significant for atypical chest pain, cardiac workup has been negative, she did have coronary CT angio which showed normal coronaries with calcium  score 0 there was also some neurological issue going on suspicion for some autoimmune neurological problem but that eventually has been ruled out.  Denies having any issue from that aspect.  Complain of having chest pain very atypical stabbing sharp lasting only for split-second located on the left side not related to exercise.  She is trying to BL be more active she is using cardio varies diet, so her breakfast is typically scrambled eggs with bacon.  She ate some steak and for dinner many times she had hamburger.  She feels great she said she is happy the way and that things are progressing.  Denies have any palpitations no shortness of breath started exercising on the regular basis.  Past Medical History:  Diagnosis Date   Abnormal stress test 11/10/2019   Anxiety    Asthma    Atypical chest pain 09/15/2019   Chest tightness 11/26/2019   Depression    Dysphagia    Gait abnormality 11/03/2019   Hiatal hernia    HSV infection    1 & 2    Hypercholesterolemia 09/11/2018   Hypertension    Left facial numbness 10/25/2019   Left wrist pain 01/27/2018   Mixed hyperlipidemia 11/03/2019   Need for prophylactic vaccination and inoculation against influenza 03/01/2020   Need for tetanus, diphtheria, and acellular pertussis (Tdap) vaccine 03/01/2020   Obstructive sleep apnea 09/11/2018   Palpitations 09/15/2019   Paresthesia 11/03/2019   Swelling of both lower extremities 09/11/2018     Past Surgical History:  Procedure Laterality Date   BREAST BIOPSY Left    CHOLECYSTECTOMY     COLONOSCOPY     DG BARIUM SWALLOW ([facility] HX)  07/20/2021   TUBAL LIGATION      Current Medications: Current Meds  Medication Sig   aspirin  EC 81 MG tablet Take 1 tablet (81 mg total) by mouth daily. Swallow whole.   lisinopril -hydrochlorothiazide  (ZESTORETIC ) 10-12.5 MG tablet take ONE-HALF tablet by MOUTH every DAY   metoprolol  tartrate (LOPRESSOR ) 25 MG tablet Take 1 tablet (25 mg total) by mouth 2 (two) times daily. (Patient taking differently: Take 12.5 mg by mouth 2 (two) times daily.)   [DISCONTINUED] albuterol  (VENTOLIN  HFA) 108 (90 Base) MCG/ACT inhaler Inhale 2 puffs into the lungs every 6 (six) hours as needed for wheezing or shortness of breath.   [DISCONTINUED] Albuterol -Budesonide (AIRSUPRA ) 90-80 MCG/ACT AERO Inhale 2 puffs into the lungs as needed (every four to six hours for cough, wheeze, shortness of breath.  Rinse, gargle, and spit after use).   [DISCONTINUED] atorvastatin  (LIPITOR) 10 MG tablet TAKE ONE TABLET BY MOUTH DAILY   [DISCONTINUED] fluticasone  (FLONASE ) 50 MCG/ACT nasal spray USE 2 SPRAYS INTO BOTH NOSTRILS DAILY ASNEEDED FOR ALLERGIES OR RHINITIS (Patient taking differently: Place 2 sprays into both nostrils daily.)   [DISCONTINUED] IODINE, KELP, PO Take 1 tablet by mouth daily.   [DISCONTINUED] Magnesium Bisglycinate (MAG GLYCINATE PO) Take 1 tablet by mouth daily.   [DISCONTINUED]  metroNIDAZOLE  (METROCREAM ) 0.75 % cream Can apply to face twice daily if needed. (Patient taking differently: Apply 1 Application topically 2 (two) times daily. Can apply to face twice daily if needed.)   [DISCONTINUED] tretinoin (RETIN-A) 0.1 % cream Apply 1 Application topically at bedtime.     Allergies:   Metronidazole , Ranexa  [ranolazine ], and Zolpidem   Social History   Socioeconomic History   Marital status: Divorced    Spouse name: Not on file   Number of children: 2    Years of education: Not on file   Highest education level: Not on file  Occupational History   Not on file  Tobacco Use   Smoking status: Former    Current packs/day: 0.00    Types: Cigarettes    Quit date: 2019    Years since quitting: 6.5   Smokeless tobacco: Never   Tobacco comments:    on and off for 20 years   Vaping Use   Vaping status: Former  Substance and Sexual Activity   Alcohol use: Yes    Alcohol/week: 1.0 - 2.0 standard drink of alcohol    Types: 1 - 2 Standard drinks or equivalent per week   Drug use: Yes    Types: Marijuana    Comment: at night for insomnia sometimes    Sexual activity: Yes  Other Topics Concern   Not on file  Social History Narrative   Lives alone    Right handed   Caffeine: 1 cup/day   Social Drivers of Corporate investment banker Strain: Not on file  Food Insecurity: Not on file  Transportation Needs: Not on file  Physical Activity: Not on file  Stress: Not on file  Social Connections: Not on file     Family History: The patient's family history includes Atrial fibrillation in her sister; Colon cancer in her paternal grandfather; Dementia in her paternal grandmother; Depression in her mother; Diabetes in her brother, brother, and son; Heart attack in her maternal grandfather and maternal grandmother; Heart disease in her maternal grandmother; Heart failure in her sister; Hyperlipidemia in her brother, father, and mother; Hypertension in her brother, brother, and mother; Myelodysplastic syndrome in her brother; Skin cancer in her father; Sleep apnea in her brother. There is no history of Allergic rhinitis, Angioedema, Asthma, Atopy, Eczema, Immunodeficiency, or Urticaria. ROS:   Please see the history of present illness.    All 14 point review of systems negative except as described per history of present illness  EKGs/Labs/Other Studies Reviewed:         Recent Labs: No results found for requested labs within last 365 days.   Recent Lipid Panel    Component Value Date/Time   CHOL 226 (H) 02/11/2023 0904   TRIG 81 02/11/2023 0904   HDL 56 02/11/2023 0904   CHOLHDL 4.0 02/11/2023 0904   LDLCALC 156 (H) 02/11/2023 0904    Physical Exam:    VS:  BP 112/68 (BP Location: Left Arm, Patient Position: Sitting)   Pulse 62   Ht 5' 5 (1.651 m)   Wt 167 lb 12.8 oz (76.1 kg)   SpO2 97%   BMI 27.92 kg/m     Wt Readings from Last 3 Encounters:  10/07/23 167 lb 12.8 oz (76.1 kg)  01/01/23 186 lb 3.2 oz (84.5 kg)  12/12/22 188 lb (85.3 kg)     GEN:  Well nourished, well developed in no acute distress HEENT: Normal NECK: No JVD; No carotid bruits LYMPHATICS: No lymphadenopathy CARDIAC:  RRR, no murmurs, no rubs, no gallops RESPIRATORY:  Clear to auscultation without rales, wheezing or rhonchi  ABDOMEN: Soft, non-tender, non-distended MUSCULOSKELETAL:  No edema; No deformity  SKIN: Warm and dry LOWER EXTREMITIES: no swelling NEUROLOGIC:  Alert and oriented x 3 PSYCHIATRIC:  Normal affect   ASSESSMENT:    1. Peripheral vascular disease, unspecified (HCC) mild carotic artery up to 39%   2. Primary hypertension   3. Hypercholesterolemia   4. Palpitations    PLAN:    In order of problems listed above:  Peripheral vascular disease in form of carotic arterial disease.   her risk factors are not well modified, her cholesterol last time was elevated.  Will recheck fasting lipid profile, we will schedule her to have carotic ultrasounds. Essential hypertension blood pressure well-controlled continue present management. Dyslipidemia I did review K PN which show me data from last year with LDL of 156!,  HDL 56.  Will recheck it again Palpitations: Denies having any   Medication Adjustments/Labs and Tests Ordered: Current medicines are reviewed at length with the patient today.  Concerns regarding medicines are outlined above.  No orders of the defined types were placed in this encounter.  Medication changes:  No orders of the defined types were placed in this encounter.   Signed, Lamar DOROTHA Fitch, MD, [facility] 10/07/2023 9:05 AM    [facility]

## 2023-10-07 NOTE — Patient Instructions (Addendum)
 Medication Instructions:  Your physician recommends that you continue on your current medications as directed. Please refer to the Current Medication list given to you today.  *If you need a refill on your cardiac medications before your next appointment, please call your pharmacy*   Lab Work: Lipoprotein, Lpa- today If you have labs (blood work) drawn today and your tests are completely normal, you will receive your results only by: MyChart Message (if you have MyChart) OR A paper copy in the mail If you have any lab test that is abnormal or we need to change your treatment, we will call you to review the results.   Testing/Procedures: Your physician has requested that you have a carotid duplex. This test is an ultrasound of the carotid arteries in your neck. It looks at blood flow through these arteries that supply the brain with blood. Allow one hour for this exam. There are no restrictions or special instructions.    Follow-Up: At [facility], you and your health needs are our priority.  As part of our continuing mission to provide you with exceptional heart care, we have created designated Provider Care Teams.  These Care Teams include your primary Cardiologist (physician) and Advanced Practice Providers (APPs -  Physician Assistants and Nurse Practitioners) who all work together to provide you with the care you need, when you need it.  We recommend signing up for the patient portal called MyChart.  Sign up information is provided on this After Visit Summary.  MyChart is used to connect with patients for Virtual Visits (Telemedicine).  Patients are able to view lab/test results, encounter notes, upcoming appointments, etc.  Non-urgent messages can be sent to your provider as well.   To learn more about what you can do with MyChart, go to [email or web address].    Your next appointment:   12 month(s)  The format for your next appointment:   In Person  Provider:   Lamar Fitch, MD    Other Instructions NA

## 2023-10-09 LAB — NMR, LIPOPROFILE
Cholesterol, Total: 280 mg/dL — ABNORMAL HIGH (ref 100–199)
HDL Particle Number: 34.9 umol/L (ref >=30.5)
HDL-C: 68 mg/dL (ref >39)
LDL Particle Number: 1970 nmol/L — ABNORMAL HIGH (ref <1000)
LDL Size: 21.9 nm (ref >20.5)
LDL-C (NIH Calc): 204 mg/dL — ABNORMAL HIGH (ref 0–99)
LP-IR Score: <25 (ref <=45)
Small LDL Particle Number: 426 nmol/L (ref <=527)
Triglycerides: 57 mg/dL (ref 0–149)

## 2023-10-09 LAB — LIPOPROTEIN A (LPA): Lipoprotein (a): 27.7 nmol/L (ref <75.0)

## 2023-10-22 ENCOUNTER — Ambulatory Visit: Payer: Self-pay | Admitting: Cardiology

## 2023-10-23 ENCOUNTER — Telehealth: Payer: Self-pay

## 2023-10-23 NOTE — Telephone Encounter (Signed)
 Left message on My Chart with lab results per Dr. Karry note. Routed to PCP

## 2023-10-30 ENCOUNTER — Telehealth: Payer: Self-pay

## 2023-10-30 ENCOUNTER — Other Ambulatory Visit: Payer: Self-pay | Admitting: Medical Genetics

## 2023-10-30 NOTE — Telephone Encounter (Signed)
 Pt viewed lab results on My Chart per Dr. Karry note. Routed to PCP.

## 2023-11-04 ENCOUNTER — Ambulatory Visit: Attending: Cardiology

## 2023-11-04 DIAGNOSIS — I739 Peripheral vascular disease, unspecified: Secondary | ICD-10-CM | POA: Diagnosis not present

## 2023-11-08 ENCOUNTER — Telehealth: Payer: Self-pay

## 2023-11-08 NOTE — Telephone Encounter (Signed)
 Left message on My Chart with Carotid US  results per Dr. Karry note. Routed to PCP.

## 2023-11-10 ENCOUNTER — Other Ambulatory Visit: Payer: Self-pay | Admitting: Cardiology

## 2023-11-13 ENCOUNTER — Other Ambulatory Visit

## 2023-11-13 DIAGNOSIS — Z006 Encounter for examination for normal comparison and control in clinical research program: Secondary | ICD-10-CM

## 2023-11-22 ENCOUNTER — Encounter (HOSPITAL_BASED_OUTPATIENT_CLINIC_OR_DEPARTMENT_OTHER): Payer: Self-pay

## 2023-11-22 ENCOUNTER — Ambulatory Visit (HOSPITAL_BASED_OUTPATIENT_CLINIC_OR_DEPARTMENT_OTHER): Admit: 2023-11-22 | Discharge: 2023-11-22 | Disposition: A | Discharge status: Home / Self Care | Admitting: Radiology

## 2023-11-22 ENCOUNTER — Ambulatory Visit (HOSPITAL_BASED_OUTPATIENT_CLINIC_OR_DEPARTMENT_OTHER)
Admission: EM | Admit: 2023-11-22 | Discharge: 2023-11-22 | Disposition: A | Discharge status: Home / Self Care | Source: Ambulatory Visit | Attending: Family Medicine | Admitting: Family Medicine

## 2023-11-22 DIAGNOSIS — R0789 Other chest pain: Secondary | ICD-10-CM

## 2023-11-22 DIAGNOSIS — R509 Fever, unspecified: Secondary | ICD-10-CM

## 2023-11-22 DIAGNOSIS — R059 Cough, unspecified: Secondary | ICD-10-CM

## 2023-11-22 LAB — GENECONNECT MOLECULAR SCREEN: Genetic Analysis Overall Interpretation: NEGATIVE

## 2023-11-22 NOTE — ED Triage Notes (Addendum)
 Patient is very sensitive to frangrances. States early this week, she reacted to someone's cologne. States It always settles in my chest. States has been coughing and feels as if she is full of phlegm to the left upper side. Has taken 2 home covid/flu tests this week and they were negative. Moist cough. States has had a fever as well.

## 2023-11-22 NOTE — ED Provider Notes (Signed)
 [facility]    CSN: [id number] Arrival date & time: 11/22/23  1729      History   Chief Complaint Chief Complaint  Patient presents with   Allergic Reaction    HPI Yolanda Sanchez is a 63 y.o. female.   Patient is a 63 year old female who presents today with chest tightness, cough, shin, low-grade fever.  Patient is very sensitive to frangrances. States early this week, she reacted to someone's cologne. States It always settles in my chest. States has been coughing and feels as if she is full of phlegm to the left upper side. Has taken 2 home covid/flu tests this week and they were negative. States has had a fever as well.    Allergic Reaction   Past Medical History:  Diagnosis Date   Abnormal stress test 11/10/2019   Anxiety    Asthma    Atypical chest pain 09/15/2019   Chest tightness 11/26/2019   Depression    Dysphagia    Gait abnormality 11/03/2019   Hiatal hernia    HSV infection    1 & 2    Hypercholesterolemia 09/11/2018   Hypertension    Left facial numbness 10/25/2019   Left wrist pain 01/27/2018   Mixed hyperlipidemia 11/03/2019   Need for prophylactic vaccination and inoculation against influenza 03/01/2020   Need for tetanus, diphtheria, and acellular pertussis (Tdap) vaccine 03/01/2020   Obstructive sleep apnea 09/11/2018   Palpitations 09/15/2019   Paresthesia 11/03/2019   Swelling of both lower extremities 09/11/2018    Patient Active Problem List   Diagnosis Date Noted   Migraine without aura and with status migrainosus, not intractable 03/18/2023   Atrophic vaginitis 02/27/2023   Status post eye surgery 02/06/2023   Peripheral vascular disease, unspecified (HCC) mild carotic artery up to 39% 01/01/2023   Family history of eye movement disorder 12/06/2022   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   Pain of left eye 12/06/2022   Dysphagia 06/06/2021   Laryngopharyngeal  reflux 06/06/2021   Nasal septal deviation 06/06/2021   Chronic fatigue 11/15/2020   Syncope and collapse 09/08/2020   Breast mass, right 08/30/2020   Burn 08/30/2020   Screen for sexually transmitted diseases 08/30/2020   Need for prophylactic vaccination against Streptococcus pneumoniae (pneumococcus) 08/30/2020   Asthma    Need for prophylactic vaccination and inoculation against influenza 03/01/2020   Need for tetanus, diphtheria, and acellular pertussis (Tdap) vaccine 03/01/2020   Chest tightness 11/26/2019   Depression    HSV infection    Abnormal stress test 11/10/2019   Paresthesia 11/03/2019   Gait abnormality 11/03/2019   Mixed hyperlipidemia 11/03/2019   Left facial numbness 10/25/2019   Palpitations 09/15/2019   Other chest pain 09/15/2019   Hypercholesterolemia 09/11/2018   Anxiety 09/11/2018   Hypertension 09/11/2018   Obstructive sleep apnea 09/11/2018   Swelling of both lower extremities 09/11/2018   Arthritis of carpometacarpal [facility]) joint of left thumb 02/25/2018   Left wrist pain 01/27/2018    Past Surgical History:  Procedure Laterality Date   BREAST BIOPSY Left    CHOLECYSTECTOMY     COLONOSCOPY     DG BARIUM SWALLOW ([facility] HX)  07/20/2021   TUBAL LIGATION      OB History   No obstetric history on file.      Home Medications    Prior to Admission medications   Medication Sig Start Date End Date Taking? Authorizing Provider  aspirin  EC  81 MG tablet Take 1 tablet (81 mg total) by mouth daily. Swallow whole. 10/12/19   Krasowski, Robert J, MD  lisinopril -hydrochlorothiazide  (ZESTORETIC ) 10-12.5 MG tablet take ONE-HALF tablet by MOUTH every DAY 11/22/22   Krasowski, Robert J, MD  metoprolol  tartrate (LOPRESSOR ) 25 MG tablet Take 1 tablet (25 mg total) by mouth 2 (two) times daily. 11/12/23   Bernie Lamar PARAS, MD    Family History Family History  Problem Relation Age of Onset   Hyperlipidemia Mother    Hypertension Mother    Depression Mother     Hyperlipidemia Father    Skin cancer Father    Hypertension Brother    Diabetes Brother    Myelodysplastic syndrome Brother    Heart attack Maternal Grandmother    Heart disease Maternal Grandmother    Heart attack Maternal Grandfather    Dementia Paternal Grandmother    Colon cancer Paternal Grandfather    Hypertension Brother    Hyperlipidemia Brother    Sleep apnea Brother    Diabetes Brother    Diabetes Son    Heart failure Sister    Atrial fibrillation Sister    Allergic rhinitis Neg Hx    Angioedema Neg Hx    Asthma Neg Hx    Atopy Neg Hx    Eczema Neg Hx    Immunodeficiency Neg Hx    Urticaria Neg Hx     Social History Social History   Tobacco Use   Smoking status: Former    Current packs/day: 0.00    Types: Cigarettes    Quit date: 2019    Years since quitting: 6.7   Smokeless tobacco: Never   Tobacco comments:    on and off for 20 years   Vaping Use   Vaping status: Former  Substance Use Topics   Alcohol use: Yes    Alcohol/week: 1.0 - 2.0 standard drink of alcohol    Types: 1 - 2 Standard drinks or equivalent per week   Drug use: Yes    Types: Marijuana    Comment: at night for insomnia sometimes      Allergies   Metronidazole , Ranexa  [ranolazine ], and Zolpidem   Review of Systems Review of Systems See HPI  Physical Exam Triage Vital Signs ED Triage Vitals  Encounter Vitals Group     BP 11/22/23 1748 130/89     Girls Systolic BP Percentile --      Girls Diastolic BP Percentile --      Boys Systolic BP Percentile --      Boys Diastolic BP Percentile --      Pulse Rate 11/22/23 1748 (!) 112     Resp 11/22/23 1748 20     Temp 11/22/23 1748 99.1 F (37.3 C)     Temp Source 11/22/23 1748 Oral     SpO2 11/22/23 1748 95 %     Weight --      Height --      Head Circumference --      Peak Flow --      Pain Score 11/22/23 1749 3     Pain Loc --      Pain Education --      Exclude from Growth Chart --    No data found.  Updated  Vital Signs BP 130/89 (BP Location: Right Arm)   Pulse (!) 112   Temp 99.1 F (37.3 C) (Oral)   Resp 20   SpO2 95%   Visual Acuity Right Eye Distance:   Left  Eye Distance:   Bilateral Distance:    Right Eye Near:   Left Eye Near:    Bilateral Near:     Physical Exam Vitals and nursing note reviewed.  Constitutional:      General: She is not in acute distress.    Appearance: Normal appearance. She is not ill-appearing, toxic-appearing or diaphoretic.  Cardiovascular:     Rate and Rhythm: Normal rate and regular rhythm.  Pulmonary:     Effort: Pulmonary effort is normal.     Breath sounds: Normal breath sounds.  Neurological:     Mental Status: She is alert.  Psychiatric:        Mood and Affect: Mood normal.      UC Treatments / Results  Labs (all labs ordered are listed, but only abnormal results are displayed) Labs Reviewed - No data to display  EKG   Radiology DG Chest 2 View Result Date: 11/22/2023 CLINICAL DATA:  cough, fever EXAM: CHEST - 2 VIEW COMPARISON:  07/04/2019 FINDINGS: No focal airspace consolidation, pleural effusion, or pneumothorax. No cardiomegaly.No acute fracture or destructive lesion. Cholecystectomy clips. Multilevel thoracic osteophytosis. IMPRESSION: No acute cardiopulmonary abnormality. Electronically Signed   By: Rogelia Myers M.D.   On: 11/22/2023 18:22    Procedures Procedures (including critical care time)  Medications Ordered in UC Medications - No data to display  Initial Impression / Assessment and Plan / UC Course  I have reviewed the triage vital signs and the nursing notes.  Pertinent labs & imaging results that were available during my care of the patient were reviewed by me and considered in my medical decision making (see chart for details).     Chest tightness-I believe this may be due to some sort of viral illness and chest wall inflammation from this.  She does have low-grade fever here today.  Chest x-ray was  normal without any concerns for pneumonia at this time. Do not feel this is cardiac.   Recommend start Mucinex day and night to thin mucus.  Ibuprofen for pain.  If the chest pain worsens or condition worsens recommendations were to go to the ER. Patient understanding and agreed Final Clinical Impressions(s) / UC Diagnoses   Final diagnoses:  Chest tightness     Discharge Instructions      Your chest x ray was normal. You can take mucinex day and night OTC to thin mucous and get it out of the chest. You can take Ibuprofen for pain. Follow up as needed.      ED Prescriptions   None    PDMP not reviewed this encounter.   Adah Wilbert LABOR, FNP 11/23/23 [phone]

## 2023-11-22 NOTE — Discharge Instructions (Signed)
 Your chest x ray was normal. You can take mucinex day and night OTC to thin mucous and get it out of the chest. You can take Ibuprofen for pain. Follow up as needed.

## 2023-12-12 ENCOUNTER — Ambulatory Visit: Payer: BC Managed Care – PPO | Admitting: Allergy and Immunology

## 2023-12-12 ENCOUNTER — Encounter: Payer: Self-pay | Admitting: Allergy and Immunology

## 2023-12-12 VITALS — BP 94/62 | HR 78 | Resp 16 | Ht 64.0 in | Wt 167.0 lb

## 2023-12-12 DIAGNOSIS — J4520 Mild intermittent asthma, uncomplicated: Secondary | ICD-10-CM

## 2023-12-12 DIAGNOSIS — J3089 Other allergic rhinitis: Secondary | ICD-10-CM | POA: Diagnosis not present

## 2023-12-12 NOTE — Patient Instructions (Signed)
  1. If needed:   A. AIRSUPRA  - 2 inhalations every 4-6 hours   2. Influenza = Tamiflu. Covid = Paxlovid  3. Return to clinic in 12 months or earlier if problem

## 2023-12-12 NOTE — Progress Notes (Signed)
 [place] - [place] - [place] - [place] - [place]   Follow-up Note  Referring Provider: No ref. provider found Primary Provider: Gayl Males, MD Date of Office Visit: 12/12/2023  Subjective:   Yolanda Sanchez (DOB: Aug 05, 1960) is a 63 y.o. female who [facility] in re-evaluation of the following:  HPI: Yolanda Sanchez returns to this clinic in evaluation of asthma, allergic rhinitis, LPR, esophageal dysmotility, rosacea.  I last saw Yolanda Sanchez in this clinic 12 December 2022.  Yolanda Sanchez has had an excellent year and most of Yolanda Sanchez disease states have really minimized during this timeframe.  Yolanda Sanchez rarely has any problems with Yolanda Sanchez asthma and rarely uses any short acting bronchodilator.  Yolanda Sanchez rarely has any problems with Yolanda Sanchez nose.  Yolanda Sanchez has not required a systemic steroid or an antibiotic for any type of airway issue.  Yolanda Sanchez did end up in the urgent care center after being exposed to a large amount of frequents and had a chest x-ray for a coughing episode but apparently everything was okay and Yolanda Sanchez was not treated with anything other than Mucinex.  Yolanda Sanchez skin on Yolanda Sanchez face is doing very well and Yolanda Sanchez no longer uses any [employer] .  Yolanda Sanchez has had no issues with reflux.  Allergies as of 12/12/2023       Reactions   Metronidazole  Other (See Comments)   Ranexa  [ranolazine ]    10-20-19   Zolpidem Other (See Comments)        Medication List    estradiol  0.1 MG/GM vaginal cream Commonly known as: ESTRACE  Place 1 g vaginally once a week.   lisinopril -hydrochlorothiazide  10-12.5 MG tablet Commonly known as: ZESTORETIC  take ONE-HALF tablet by MOUTH every DAY   metoprolol  tartrate 25 MG tablet Commonly known as: LOPRESSOR  Take 1 tablet (25 mg total) by mouth 2 (two) times daily.    Past Medical History:  Diagnosis Date   Abnormal stress test 11/10/2019   Anxiety    Asthma    Atypical chest pain 09/15/2019   Chest tightness 11/26/2019   Depression     Dysphagia    Gait abnormality 11/03/2019   Hiatal hernia    HSV infection    1 & 2    Hypercholesterolemia 09/11/2018   Hypertension    Left facial numbness 10/25/2019   Left wrist pain 01/27/2018   Mixed hyperlipidemia 11/03/2019   Need for prophylactic vaccination and inoculation against influenza 03/01/2020   Need for tetanus, diphtheria, and acellular pertussis (Tdap) vaccine 03/01/2020   Obstructive sleep apnea 09/11/2018   Palpitations 09/15/2019   Paresthesia 11/03/2019   Swelling of both lower extremities 09/11/2018    Past Surgical History:  Procedure Laterality Date   BREAST BIOPSY Left    CHOLECYSTECTOMY     COLONOSCOPY     DG BARIUM SWALLOW ([facility] HX)  07/20/2021   EYE SURGERY  01/2023   TUBAL LIGATION      Review of systems negative except as noted in HPI / PMHx or noted below:  Review of Systems  Constitutional: Negative.   HENT: Negative.    Eyes: Negative.   Respiratory: Negative.    Cardiovascular: Negative.   Gastrointestinal: Negative.   Genitourinary: Negative.   Musculoskeletal: Negative.   Skin: Negative.   Neurological: Negative.   Endo/Heme/Allergies: Negative.   Psychiatric/Behavioral: Negative.       Objective:   Vitals:   12/12/23 1555  BP: 94/62  Pulse: 78  Resp: 16  SpO2: 98%   Height: 5' 4 (  162.6 cm)  Weight: 167 lb (75.8 kg)   Physical Exam Constitutional:      Appearance: Yolanda Sanchez is not diaphoretic.  HENT:     Head: Normocephalic.     Right Ear: Tympanic membrane, ear canal and external ear normal.     Left Ear: Tympanic membrane, ear canal and external ear normal.     Nose: Nose normal. No mucosal edema or rhinorrhea.     Mouth/Throat:     Pharynx: Uvula midline. No oropharyngeal exudate.  Eyes:     Conjunctiva/sclera: Conjunctivae normal.  Neck:     Thyroid: No thyromegaly.     Trachea: Trachea normal. No tracheal tenderness or tracheal deviation.  Cardiovascular:     Rate and Rhythm: Normal rate and regular  rhythm.     Heart sounds: Normal heart sounds, S1 normal and S2 normal. No murmur heard. Pulmonary:     Effort: No respiratory distress.     Breath sounds: Normal breath sounds. No stridor. No wheezing or rales.  Lymphadenopathy:     Head:     Right side of head: No tonsillar adenopathy.     Left side of head: No tonsillar adenopathy.     Cervical: No cervical adenopathy.  Skin:    Findings: No erythema or rash.     Nails: There is no clubbing.  Neurological:     Mental Status: Yolanda Sanchez is alert.     Diagnostics:  Results of a chest x-ray obtained 22 November 2023 identified the following:  No focal airspace consolidation, pleural effusion, or pneumothorax. No cardiomegaly.No acute fracture or destructive lesion. Cholecystectomy clips. Multilevel thoracic osteophytosis.  Assessment and Plan:   1. Asthma, mild intermittent, well-controlled   2. Perennial allergic rhinitis    1. If needed:   A. AIRSUPRA  - 2 inhalations every 4-6 hours   2. Influenza = Tamiflu. Covid = Paxlovid  3. Return to clinic in 12 months or earlier if problem  Yolanda Sanchez is really doing very well with minimal amounts of medications and I have refilled Yolanda Sanchez anti-inflammatory rescue medicine and Yolanda Sanchez can utilize this as needed and if Yolanda Sanchez continues to do this well then there is no reason for Yolanda Sanchez to be seen in this clinic on a regular basis and we will just see Yolanda Sanchez back in this clinic in 1 year to refill Yolanda Sanchez medications or earlier if there is a problem.  Camellia Denis, MD Allergy / Immunology [facility]

## 2023-12-16 ENCOUNTER — Encounter: Payer: Self-pay | Admitting: Allergy and Immunology

## 2023-12-31 ENCOUNTER — Other Ambulatory Visit: Payer: Self-pay | Admitting: Cardiology

## 2024-02-07 ENCOUNTER — Ambulatory Visit (HOSPITAL_BASED_OUTPATIENT_CLINIC_OR_DEPARTMENT_OTHER)

## 2024-03-09 ENCOUNTER — Other Ambulatory Visit: Payer: Self-pay | Admitting: Obstetrics and Gynecology

## 2024-03-09 DIAGNOSIS — Z1231 Encounter for screening mammogram for malignant neoplasm of breast: Secondary | ICD-10-CM

## 2024-03-25 ENCOUNTER — Ambulatory Visit

## 2024-03-31 ENCOUNTER — Ambulatory Visit
Admission: RE | Admit: 2024-03-31 | Discharge: 2024-03-31 | Disposition: A | Source: Ambulatory Visit | Attending: Obstetrics and Gynecology | Admitting: Obstetrics and Gynecology

## 2024-03-31 DIAGNOSIS — Z1231 Encounter for screening mammogram for malignant neoplasm of breast: Secondary | ICD-10-CM

## 2024-04-10 ENCOUNTER — Other Ambulatory Visit: Payer: Self-pay | Admitting: Obstetrics and Gynecology

## 2024-04-10 DIAGNOSIS — R928 Other abnormal and inconclusive findings on diagnostic imaging of breast: Secondary | ICD-10-CM

## 2024-04-17 ENCOUNTER — Inpatient Hospital Stay: Admission: RE | Admit: 2024-04-17

## 2024-04-17 DIAGNOSIS — R928 Other abnormal and inconclusive findings on diagnostic imaging of breast: Secondary | ICD-10-CM
